# Patient Record
Sex: Female | Born: 1955 | Race: White | Hispanic: No | Marital: Single | Smoking: Current every day smoker
Health system: Southern US, Community
[De-identification: ages and names within clinical notes are randomized; demographics above are authoritative.]

## PROBLEM LIST (undated history)

## (undated) DIAGNOSIS — J449 Chronic obstructive pulmonary disease, unspecified: Secondary | ICD-10-CM

## (undated) DIAGNOSIS — Z860100 Personal history of colon polyps, unspecified: Secondary | ICD-10-CM

## (undated) DIAGNOSIS — E785 Hyperlipidemia, unspecified: Secondary | ICD-10-CM

## (undated) DIAGNOSIS — F1011 Alcohol abuse, in remission: Secondary | ICD-10-CM

## (undated) DIAGNOSIS — I10 Essential (primary) hypertension: Secondary | ICD-10-CM

## (undated) DIAGNOSIS — E063 Autoimmune thyroiditis: Secondary | ICD-10-CM

## (undated) DIAGNOSIS — C140 Malignant neoplasm of pharynx, unspecified: Secondary | ICD-10-CM

## (undated) DIAGNOSIS — F17200 Nicotine dependence, unspecified, uncomplicated: Secondary | ICD-10-CM

## (undated) DIAGNOSIS — F1411 Cocaine abuse, in remission: Secondary | ICD-10-CM

## (undated) DIAGNOSIS — R7303 Prediabetes: Secondary | ICD-10-CM

## (undated) HISTORY — DX: Personal history of colonic polyps: Z86.010

## (undated) HISTORY — DX: Personal history of colon polyps, unspecified: Z86.0100

## (undated) HISTORY — DX: Cocaine abuse, in remission: F14.11

## (undated) HISTORY — DX: Chronic obstructive pulmonary disease, unspecified: J44.9

## (undated) HISTORY — DX: Hyperlipidemia, unspecified: E78.5

## (undated) HISTORY — DX: Malignant neoplasm of pharynx, unspecified: C14.0

## (undated) HISTORY — DX: Essential (primary) hypertension: I10

## (undated) HISTORY — DX: Autoimmune thyroiditis: E06.3

## (undated) HISTORY — DX: Prediabetes: R73.03

## (undated) HISTORY — DX: Alcohol abuse, in remission: F10.11

## (undated) HISTORY — DX: Nicotine dependence, unspecified, uncomplicated: F17.200

---

## 2003-01-02 DIAGNOSIS — R0600 Dyspnea, unspecified: Secondary | ICD-10-CM | POA: Insufficient documentation

## 2003-01-02 DIAGNOSIS — Z8619 Personal history of other infectious and parasitic diseases: Secondary | ICD-10-CM | POA: Insufficient documentation

## 2008-05-07 DIAGNOSIS — G4730 Sleep apnea, unspecified: Secondary | ICD-10-CM | POA: Insufficient documentation

## 2009-05-28 DIAGNOSIS — E559 Vitamin D deficiency, unspecified: Secondary | ICD-10-CM | POA: Insufficient documentation

## 2009-06-10 DIAGNOSIS — H40009 Preglaucoma, unspecified, unspecified eye: Secondary | ICD-10-CM | POA: Insufficient documentation

## 2009-06-10 DIAGNOSIS — H04129 Dry eye syndrome of unspecified lacrimal gland: Secondary | ICD-10-CM | POA: Insufficient documentation

## 2012-10-19 DIAGNOSIS — E669 Obesity, unspecified: Secondary | ICD-10-CM | POA: Insufficient documentation

## 2014-01-16 DIAGNOSIS — F419 Anxiety disorder, unspecified: Secondary | ICD-10-CM | POA: Insufficient documentation

## 2014-10-12 HISTORY — PX: COLONOSCOPY W/ POLYPECTOMY: SHX1380

## 2015-03-07 DIAGNOSIS — S82451S Displaced comminuted fracture of shaft of right fibula, sequela: Secondary | ICD-10-CM | POA: Insufficient documentation

## 2015-03-21 DIAGNOSIS — R490 Dysphonia: Secondary | ICD-10-CM | POA: Insufficient documentation

## 2015-05-29 LAB — HM COLONOSCOPY

## 2016-01-09 DIAGNOSIS — C329 Malignant neoplasm of larynx, unspecified: Secondary | ICD-10-CM | POA: Insufficient documentation

## 2016-01-10 LAB — HM HEPATITIS C SCREENING LAB: HM Hepatitis Screen: NEGATIVE

## 2016-04-29 DIAGNOSIS — R252 Cramp and spasm: Secondary | ICD-10-CM | POA: Insufficient documentation

## 2016-05-06 DIAGNOSIS — E039 Hypothyroidism, unspecified: Secondary | ICD-10-CM | POA: Insufficient documentation

## 2016-05-06 DIAGNOSIS — E038 Other specified hypothyroidism: Secondary | ICD-10-CM | POA: Insufficient documentation

## 2016-10-12 HISTORY — PX: THROAT SURGERY: SHX803

## 2017-01-13 DIAGNOSIS — H9313 Tinnitus, bilateral: Secondary | ICD-10-CM | POA: Insufficient documentation

## 2017-02-03 DIAGNOSIS — M7021 Olecranon bursitis, right elbow: Secondary | ICD-10-CM | POA: Insufficient documentation

## 2017-04-07 LAB — HM MAMMOGRAPHY

## 2017-11-29 ENCOUNTER — Ambulatory Visit (INDEPENDENT_AMBULATORY_CARE_PROVIDER_SITE_OTHER): Payer: BLUE CROSS/BLUE SHIELD | Admitting: Physician Assistant

## 2017-11-29 ENCOUNTER — Encounter: Payer: Self-pay | Admitting: Physician Assistant

## 2017-11-29 VITALS — BP 142/83 | HR 78 | Temp 98.6°F | Resp 16 | Wt 182.0 lb

## 2017-11-29 DIAGNOSIS — R634 Abnormal weight loss: Secondary | ICD-10-CM

## 2017-11-29 DIAGNOSIS — I10 Essential (primary) hypertension: Secondary | ICD-10-CM | POA: Diagnosis not present

## 2017-11-29 DIAGNOSIS — Z2821 Immunization not carried out because of patient refusal: Secondary | ICD-10-CM | POA: Diagnosis not present

## 2017-11-29 DIAGNOSIS — Z860100 Personal history of colon polyps, unspecified: Secondary | ICD-10-CM

## 2017-11-29 DIAGNOSIS — E782 Mixed hyperlipidemia: Secondary | ICD-10-CM | POA: Diagnosis not present

## 2017-11-29 DIAGNOSIS — R7303 Prediabetes: Secondary | ICD-10-CM

## 2017-11-29 DIAGNOSIS — J449 Chronic obstructive pulmonary disease, unspecified: Secondary | ICD-10-CM

## 2017-11-29 DIAGNOSIS — C140 Malignant neoplasm of pharynx, unspecified: Secondary | ICD-10-CM | POA: Diagnosis not present

## 2017-11-29 DIAGNOSIS — Z23 Encounter for immunization: Secondary | ICD-10-CM

## 2017-11-29 DIAGNOSIS — E039 Hypothyroidism, unspecified: Secondary | ICD-10-CM

## 2017-11-29 DIAGNOSIS — E038 Other specified hypothyroidism: Secondary | ICD-10-CM

## 2017-11-29 DIAGNOSIS — F17210 Nicotine dependence, cigarettes, uncomplicated: Secondary | ICD-10-CM | POA: Diagnosis not present

## 2017-11-29 DIAGNOSIS — Z122 Encounter for screening for malignant neoplasm of respiratory organs: Secondary | ICD-10-CM | POA: Diagnosis not present

## 2017-11-29 DIAGNOSIS — Z7689 Persons encountering health services in other specified circumstances: Secondary | ICD-10-CM

## 2017-11-29 MED ORDER — UMECLIDINIUM BROMIDE 62.5 MCG/INH IN AEPB
1.0000 | INHALATION_SPRAY | Freq: Every day | RESPIRATORY_TRACT | 2 refills | Status: DC
Start: 2017-11-29 — End: 2022-08-13

## 2017-11-29 NOTE — Patient Instructions (Addendum)
-   Go downstairs for labs today. I need to know your lab results to safely refill your medications  For your blood pressure: - Goal <130/80 - continue your daily medications - baby aspirin 81 mg daily to help prevent heart attack/stroke - monitor and log blood pressures at home - check around the same time each day in a relaxed setting - Limit salt to <2000 mg/day - Follow DASH eating plan - limit alcohol to 2 standard drinks per day for men and 1 per day for women - avoid tobacco products - weight loss: 7% of current body weight - follow-up every 3 months for your blood pressure   For shortness of breath/COPD: - continue to work on quitting smoking. This is the best treatment - start Incruse inhaler, 1 puff daily

## 2017-11-29 NOTE — Progress Notes (Signed)
HPI:                                                                Zoe Snyder is a 62 y.o. female who presents to [facility] today to establish care  Current concerns: referral to ENT  History of throat cancer: reports hx of throat surgery in 2017 by Dr. Nydia Bouton at [facility]. She does not have these records with her today. States she saw her surgeon for follow-up about 3-4 months ago and was told she would need another surgery for a suspicious lesion seen on laryngoscopy. She endorses hoarseness and unintended weight loss. She is unable to afford follow-up/travel to [facility] and is requesting to establish with an ENT doctor here.   OSA: not compliant with CPAP   Subclinical Hypothyroidism: according to prior PCP, TSH has remained ~5.8 with normal T4 for the last 4 years. She does have +TPO antibodies. She never started Levothyroxine that was prescribed to her.  HTN: taking Amlodipine and HCTZ daily. Compliant with medications. Does not check BP's at home. Denies vision change, headache, chest pain with exertion, orthopnea, lightheadedness, syncope and edema. Risk factors include: tobacco use, age>55, HLD   No flowsheet data found.  No flowsheet data found.    Past Medical History:  Diagnosis Date  . History of alcohol abuse 11/30/2017  . History of cocaine abuse 11/30/2017  . History of colon polyps   . Hyperlipidemia   . Hypertension   . Prediabetes   . Throat cancer ([place])   . Tobacco use disorder    Past Surgical History:  Procedure Laterality Date  . COLONOSCOPY W/ POLYPECTOMY  2016  . THROAT SURGERY  2018   cancer   Social History   Tobacco Use  . Smoking status: Current Every Day Smoker    Packs/day: 0.50    Years: 45.00    Pack years: 22.50    Types: Cigarettes  . Smokeless tobacco: Never Used  . Tobacco comment: currently smoking 0.5 ppd, but she has cut down from over 1 ppd  Substance Use  Topics  . Alcohol use: No    Frequency: Never   family history includes Colon cancer in her maternal aunt; Diabetes in her maternal grandmother; Hypertension in her mother.    ROS: Review of Systems  Constitutional: Positive for weight loss.  HENT: Positive for sore throat.   Gastrointestinal: Positive for nausea.  Musculoskeletal: Positive for myalgias.     Medications: Current Outpatient Medications  Medication Sig Dispense Refill  . aspirin EC 81 MG tablet Take 1 tablet by mouth daily.    Marland Kitchen atorvastatin (LIPITOR) 20 MG tablet Take 1 tablet by mouth daily.    . Cholecalciferol (VITAMIN D-1000 MAX ST) 1000 units tablet Take 1 tablet by mouth daily.    . fluticasone (FLONASE) 50 MCG/ACT nasal spray Place into the nose.    . loratadine (CLARITIN) 10 MG tablet Take by mouth.    . Multiple Vitamin (MULTI-VITAMIN) tablet Take by mouth.    Marland Kitchen omeprazole (PRILOSEC) 20 MG capsule Take by mouth.    . varenicline (CHANTIX PAK) 0.5 MG X 11 & 1 MG X 42 tablet Take one 0.5mg  tab once daily for first 3 days, then  0.5mg  tab twice daily for 4 days, then 1mg  tab twice daily.    Marland Kitchen amLODipine (NORVASC) 10 MG tablet Take 1 tablet (10 mg total) by mouth daily. 90 tablet 0  . hydrochlorothiazide (HYDRODIURIL) 25 MG tablet Take 1 tablet (25 mg total) by mouth daily. 90 tablet 0  . umeclidinium bromide (INCRUSE ELLIPTA) 62.5 MCG/INH AEPB Inhale 1 puff into the lungs daily. 30 each 2   No current facility-administered medications for this visit.    Allergies  Allergen Reactions  . Epinephrine Anaphylaxis    Respiratory problems, e.g., wheezing; Palpitations  Respiratory problems, e.g., wheezing; Palpitations        Objective:  BP (!) 142/83   Pulse 78   Temp 98.6 F (37 C) (Oral)   Resp 16   Wt 182 lb (82.6 kg)   SpO2 98%  Gen:  alert, not ill-appearing, no distress, appropriate for age HEENT: head normocephalic without obvious abnormality, conjunctiva and cornea clear, oropharynx clear,  moist mucous membranes, neck supple, no adenopathy, trachea midline Pulm: Normal work of breathing, voice is hoarse, clear to auscultation bilaterally, no wheezes, rales or rhonchi CV: Normal rate, regular rhythm, s1 and s2 distinct, no murmurs, clicks or rubs  Neuro: alert and oriented x 3, no tremor MSK: extremities atraumatic, normal gait and station Skin: intact, no rashes on exposed skin, no jaundice, no cyanosis Psych: well-groomed, cooperative, good eye contact, euthymic mood, affect mood-congruent, speech is articulate, and thought processes clear and goal-directed    No results found for this or any previous visit (from the past 72 hour(s)). No results found.    Assessment and Plan: 62 y.o. female with   1. Encounter to establish care - reviewed PMH, PSH, PFH, medications and allergies - reviewed health maintenance - colonoscopy UTD per patient, requesting records from [facility] - mammogram UTD, to be abstracted  2. Throat cancer ([place]) - do not have access to records. Requested today - CBC with Differential/Platelet - Ambulatory referral to ENT  3. History of colon polyps - requesting records from [facility]  4. Prediabetes - Hemoglobin A1c  5. Hypertension goal BP (blood pressure) < 130/80 BP Readings from Last 3 Encounters:  11/29/17 (!) 142/83  - counseled on therapeutic lifestyle changes - continue Amlodipine and HCTZ - COMPLETE METABOLIC PANEL WITH GFR  6. Mixed hyperlipidemia - Lipid Panel w/reflex Direct LDL  7. Subclinical hypothyroidism - TSH + free T4  8. Unintended weight loss - CBC with Differential/Platelet  9. Need for 23-polyvalent pneumococcal polysaccharide vaccine - Pneumococcal polysaccharide vaccine 23-valent greater than or equal to 2yo subcutaneous/IM  10. Encounter for screening for lung cancer - CT CHEST LUNG CA SCREEN LOW DOSE W/O CM; Future  11. Heavy tobacco smoker >10 cigarettes per day - CT CHEST LUNG CA SCREEN LOW DOSE W/O CM;  Future  12. Refused influenza vaccine  13. Chronic obstructive pulmonary disease, unspecified COPD type ([place]) - Pneumovax given today - briefly counseled on smoking cessation as the mainstay of treatment - starting Ellipta - umeclidinium bromide (INCRUSE ELLIPTA) 62.5 MCG/INH AEPB; Inhale 1 puff into the lungs daily.  Dispense: 30 each; Refill: 2   Patient education and anticipatory guidance given Patient agrees with treatment plan Follow-up in 8 weeks or sooner as needed if symptoms worsen or fail to improve  Darlyne Russian PA-C

## 2017-11-30 ENCOUNTER — Encounter: Payer: Self-pay | Admitting: Physician Assistant

## 2017-11-30 ENCOUNTER — Other Ambulatory Visit: Payer: Self-pay

## 2017-11-30 ENCOUNTER — Telehealth: Payer: Self-pay | Admitting: Physician Assistant

## 2017-11-30 ENCOUNTER — Encounter (INDEPENDENT_AMBULATORY_CARE_PROVIDER_SITE_OTHER): Payer: Self-pay

## 2017-11-30 DIAGNOSIS — F1411 Cocaine abuse, in remission: Secondary | ICD-10-CM

## 2017-11-30 DIAGNOSIS — F1011 Alcohol abuse, in remission: Secondary | ICD-10-CM | POA: Insufficient documentation

## 2017-11-30 HISTORY — DX: Alcohol abuse, in remission: F10.11

## 2017-11-30 HISTORY — DX: Cocaine abuse, in remission: F14.11

## 2017-11-30 LAB — HEMOGLOBIN A1C
Hgb A1c MFr Bld: 6 % of total Hgb — ABNORMAL HIGH (ref <5.7)
Mean Plasma Glucose: 126 (calc)
eAG (mmol/L): 7 (calc)

## 2017-11-30 LAB — COMPLETE METABOLIC PANEL WITH GFR
AG Ratio: 1.7 (calc) (ref 1.0–2.5)
ALBUMIN MSPROF: 4.3 g/dL (ref 3.6–5.1)
ALKALINE PHOSPHATASE (APISO): 83 U/L (ref 33–130)
ALT: 12 U/L (ref 6–29)
AST: 14 U/L (ref 10–35)
BILIRUBIN TOTAL: 0.7 mg/dL (ref 0.2–1.2)
BUN: 17 mg/dL (ref 7–25)
CHLORIDE: 103 mmol/L (ref 98–110)
CO2: 26 mmol/L (ref 20–32)
Calcium: 9.5 mg/dL (ref 8.6–10.4)
Creat: 0.95 mg/dL (ref 0.50–0.99)
GFR, Est African American: 75 mL/min/{1.73_m2} (ref >=60)
GFR, Est Non African American: 65 mL/min/{1.73_m2} (ref >=60)
GLUCOSE: 100 mg/dL — AB (ref 65–99)
Globulin: 2.6 g/dL (calc) (ref 1.9–3.7)
POTASSIUM: 3.5 mmol/L (ref 3.5–5.3)
Sodium: 138 mmol/L (ref 135–146)
Total Protein: 6.9 g/dL (ref 6.1–8.1)

## 2017-11-30 LAB — CBC WITH DIFFERENTIAL/PLATELET
Basophils Absolute: 59 cells/uL (ref 0–200)
Basophils Relative: 0.7 %
EOS ABS: 84 {cells}/uL (ref 15–500)
Eosinophils Relative: 1 %
HCT: 37.5 % (ref 35.0–45.0)
Hemoglobin: 13.4 g/dL (ref 11.7–15.5)
Lymphs Abs: 2234 cells/uL (ref 850–3900)
MCH: 32.1 pg (ref 27.0–33.0)
MCHC: 35.7 g/dL (ref 32.0–36.0)
MCV: 89.9 fL (ref 80.0–100.0)
MONOS PCT: 8.6 %
MPV: 10.5 fL (ref 7.5–12.5)
NEUTROS PCT: 63.1 %
Neutro Abs: 5300 cells/uL (ref 1500–7800)
PLATELETS: 260 10*3/uL (ref 140–400)
RBC: 4.17 10*6/uL (ref 3.80–5.10)
RDW: 12.9 % (ref 11.0–15.0)
TOTAL LYMPHOCYTE: 26.6 %
WBC: 8.4 10*3/uL (ref 3.8–10.8)
WBCMIX: 722 {cells}/uL (ref 200–950)

## 2017-11-30 LAB — LIPID PANEL W/REFLEX DIRECT LDL
CHOL/HDL RATIO: 2.9 (calc) (ref <5.0)
CHOLESTEROL: 195 mg/dL (ref <200)
HDL: 68 mg/dL (ref >50)
LDL Cholesterol (Calc): 101 mg/dL (calc) — ABNORMAL HIGH
NON-HDL CHOLESTEROL (CALC): 127 mg/dL (ref <130)
Triglycerides: 164 mg/dL — ABNORMAL HIGH (ref <150)

## 2017-11-30 LAB — T4, FREE: FREE T4: 0.9 ng/dL (ref 0.8–1.8)

## 2017-11-30 LAB — TSH+FREE T4: TSH W/REFLEX TO FT4: 7.11 m[IU]/L — AB (ref 0.40–4.50)

## 2017-11-30 MED ORDER — HYDROCHLOROTHIAZIDE 25 MG PO TABS: 25.0000 mg | ORAL_TABLET | Freq: Every day | ORAL | 0 refills | Status: DC

## 2017-11-30 MED ORDER — AMLODIPINE BESYLATE 10 MG PO TABS: 10.0000 mg | ORAL_TABLET | Freq: Every day | ORAL | 0 refills | Status: DC

## 2017-11-30 NOTE — Progress Notes (Signed)
-   blood counts look great - prediabetes is stable - TSH is high with a normal T4 - this is still consistent with subclinical low thyroid, which means her thyroid is still making enough hormone, but the gland is wearing out. Thyroid replacement is optional and may improve symptoms. - Cholesterol is where we want it to prevent heart attack. I would continue her Atorvastatin. She can take it at bedtime if she is having some muscle aches

## 2017-11-30 NOTE — Telephone Encounter (Signed)
Pt stopped in today. She is in need of refills on these meds.  Norvasc 10mg  1tablet x1 Aspirin EC 81mg  1 tabletx1  Cholecalciferol 1000 units x1 Hydrodiuril 25mg  x1 Multi-vitamin Please send to pharmacy on file.

## 2017-11-30 NOTE — Telephone Encounter (Signed)
Refills of amlodipine and hydrochlorothiazide sent to pharmacy.  Other meds can purchase OTC.  Pt advise. -EH/RMA

## 2017-12-01 ENCOUNTER — Encounter: Payer: Self-pay | Admitting: Physician Assistant

## 2017-12-08 ENCOUNTER — Encounter: Payer: Self-pay | Admitting: Physician Assistant

## 2017-12-08 ENCOUNTER — Telehealth: Payer: Self-pay | Admitting: Physician Assistant

## 2017-12-08 DIAGNOSIS — R7303 Prediabetes: Secondary | ICD-10-CM | POA: Insufficient documentation

## 2017-12-08 DIAGNOSIS — R634 Abnormal weight loss: Secondary | ICD-10-CM | POA: Insufficient documentation

## 2017-12-08 DIAGNOSIS — E782 Mixed hyperlipidemia: Secondary | ICD-10-CM | POA: Insufficient documentation

## 2017-12-08 DIAGNOSIS — J441 Chronic obstructive pulmonary disease with (acute) exacerbation: Secondary | ICD-10-CM | POA: Insufficient documentation

## 2017-12-08 DIAGNOSIS — J449 Chronic obstructive pulmonary disease, unspecified: Secondary | ICD-10-CM | POA: Insufficient documentation

## 2017-12-08 DIAGNOSIS — C140 Malignant neoplasm of pharynx, unspecified: Secondary | ICD-10-CM | POA: Insufficient documentation

## 2017-12-08 DIAGNOSIS — F17210 Nicotine dependence, cigarettes, uncomplicated: Secondary | ICD-10-CM | POA: Insufficient documentation

## 2017-12-08 DIAGNOSIS — I10 Essential (primary) hypertension: Secondary | ICD-10-CM | POA: Insufficient documentation

## 2017-12-08 NOTE — Telephone Encounter (Signed)
Patient is requesting to switch her primary care from [facility] to Dr. Sheppard Coil. She stated that Evlyn Clines is great, but one of her other doctor's suggested that she switch to Dr. Sheppard Coil. Please advise. Thanks!

## 2017-12-08 NOTE — Telephone Encounter (Signed)
Fine with me, can have her schedule a establish care visit!

## 2017-12-08 NOTE — Telephone Encounter (Signed)
That's fine with me if okay with Dr. Sheppard Coil

## 2017-12-14 ENCOUNTER — Encounter: Payer: Self-pay | Admitting: Physician Assistant

## 2017-12-22 ENCOUNTER — Encounter: Payer: Self-pay | Admitting: Physician Assistant

## 2018-01-27 ENCOUNTER — Ambulatory Visit: Payer: BLUE CROSS/BLUE SHIELD | Admitting: Physician Assistant

## 2018-01-27 DIAGNOSIS — Z0189 Encounter for other specified special examinations: Secondary | ICD-10-CM

## 2018-02-27 ENCOUNTER — Other Ambulatory Visit: Payer: Self-pay | Admitting: Physician Assistant

## 2020-01-15 ENCOUNTER — Ambulatory Visit: Payer: BLUE CROSS/BLUE SHIELD

## 2020-09-11 ENCOUNTER — Encounter (HOSPITAL_COMMUNITY): Payer: Self-pay | Admitting: Emergency Medicine

## 2020-09-11 ENCOUNTER — Other Ambulatory Visit: Payer: Self-pay

## 2020-09-11 ENCOUNTER — Ambulatory Visit (HOSPITAL_COMMUNITY)
Admission: EM | Admit: 2020-09-11 | Discharge: 2020-09-11 | Disposition: A | Discharge status: Home / Self Care | Payer: Self-pay | Attending: Family Medicine | Admitting: Family Medicine

## 2020-09-11 DIAGNOSIS — I10 Essential (primary) hypertension: Secondary | ICD-10-CM | POA: Insufficient documentation

## 2020-09-11 DIAGNOSIS — Z20822 Contact with and (suspected) exposure to covid-19: Secondary | ICD-10-CM | POA: Insufficient documentation

## 2020-09-11 DIAGNOSIS — Z760 Encounter for issue of repeat prescription: Secondary | ICD-10-CM | POA: Insufficient documentation

## 2020-09-11 DIAGNOSIS — J069 Acute upper respiratory infection, unspecified: Secondary | ICD-10-CM | POA: Insufficient documentation

## 2020-09-11 LAB — BASIC METABOLIC PANEL
Anion gap: 10 (ref 5–15)
BUN: 11 mg/dL (ref 8–23)
CO2: 27 mmol/L (ref 22–32)
Calcium: 9.4 mg/dL (ref 8.9–10.3)
Chloride: 104 mmol/L (ref 98–111)
Creatinine, Ser: 0.72 mg/dL (ref 0.44–1.00)
GFR, Estimated: >60 mL/min (ref >60)
Glucose, Bld: 93 mg/dL (ref 70–99)
Potassium: 4.3 mmol/L (ref 3.5–5.1)
Sodium: 141 mmol/L (ref 135–145)

## 2020-09-11 MED ORDER — IPRATROPIUM BROMIDE 0.06 % NA SOLN: 2.0000 | Freq: Four times a day (QID) | NASAL | 0 refills | Status: DC | PRN

## 2020-09-11 MED ORDER — HYDROCHLOROTHIAZIDE 25 MG PO TABS: 25.0000 mg | ORAL_TABLET | Freq: Every day | ORAL | 0 refills | Status: DC

## 2020-09-11 MED ORDER — AMLODIPINE BESYLATE 10 MG PO TABS: 10.0000 mg | ORAL_TABLET | Freq: Every day | ORAL | 0 refills | Status: DC

## 2020-09-11 NOTE — Discharge Instructions (Signed)
Please restart your blood pressure medications.  I have provided 30 day supply in order for you to establish with a primary care provider, as we do not manage these medications long term from urgent care.  Push fluids to ensure adequate hydration and keep secretions thin.  Tylenol as needed for fevers.  Nasal spray as needed for nasal congestion.  Continue to decrease to quit smoking as able.  Self isolate until covid results are back and negative.  Will notify you by phone of any positive findings. Your negative results will be sent through your MyChart.     Return for any worsening or persistent symptoms.

## 2020-09-11 NOTE — ED Provider Notes (Signed)
[place]    CSN: [id number] Arrival date & time: 09/11/20  1511      History   Chief Complaint Chief Complaint  Patient presents with  . Headache  . Sore Throat  . Diarrhea  . Nasal Congestion    HPI Zoe Snyder is a 64 y.o. female.   Zoe Snyder presents with complaints of nasal congestion, sore throat, some facial pressure, and diarrhea which started two days ago. No fevers. No shortness of breath . History of copd. She still does smoke. No known ill contacts. Works at [employer] around others. No nausea or vomiting. She hasn't taken any medications in the past year, including her blood pressure medication. Moved here two years ago and hasn't established with a PCP she has followed with. History of larynx cancer which was treated. Doesn't have a local ENT or oncologist. She has been vaccinated for covid-19.    ROS per HPI, negative if not otherwise mentioned.      Past Medical History:  Diagnosis Date  . COPD (chronic obstructive pulmonary disease) ([place])   . Hashimoto's thyroiditis   . History of alcohol abuse 11/30/2017  . History of cocaine abuse ([place]) 11/30/2017  . History of colon polyps   . Hyperlipidemia   . Hypertension   . Prediabetes   . Throat cancer ([place])   . Tobacco use disorder     Patient Active Problem List   Diagnosis Date Noted  . Chronic obstructive pulmonary disease ([place]) 12/08/2017  . Heavy tobacco smoker >10 cigarettes per day 12/08/2017  . Unintended weight loss 12/08/2017  . Mixed hyperlipidemia 12/08/2017  . Hypertension goal BP (blood pressure) < 130/80 12/08/2017  . Prediabetes 12/08/2017  . Throat cancer ([place]) 12/08/2017  . History of cocaine abuse ([place]) 11/30/2017  . History of alcohol abuse 11/30/2017  . Refused influenza vaccine 11/29/2017  . History of colon polyps   . Olecranon bursitis of right elbow 02/03/2017  . 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 Subclinical hypothyroidism 05/06/2016  . Muscle  cramps 04/29/2016  . Malignant neoplasm of larynx ([place]) 01/09/2016  . Dysphonia 03/21/2015  . Displaced comminuted fracture of shaft of right fibula, sequela 03/07/2015  . Anxiety disorder 01/16/2014  . Obesity 10/19/2012  . Dry eye syndrome 06/10/2009  . 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 Vitamin D deficiency 05/28/2009  . Sleep apnea 05/07/2008  . History of hepatitis C 01/02/2003  . Dyspnea 01/02/2003    Past Surgical History:  Procedure Laterality Date  . COLONOSCOPY W/ POLYPECTOMY  2016  . THROAT SURGERY  2018   cancer    OB History   No obstetric history on file.      Home Medications    Prior to Admission medications   Medication Sig Start Date End Date Taking? Authorizing Provider  amLODipine (NORVASC) 10 MG tablet Take 1 tablet (10 mg total) by mouth daily. Due for follow up visit 09/11/20   Augusto Gamble B, NP  aspirin EC 81 MG tablet Take 1 tablet by mouth daily. 01/07/17   [provider]  atorvastatin (LIPITOR) 20 MG tablet Take 1 tablet by mouth daily. 01/07/17   [provider]  Cholecalciferol (VITAMIN D-1000 MAX ST) 1000 units tablet Take 1 tablet by mouth daily. 01/07/17   [provider]  fluticasone (FLONASE) 50 MCG/ACT nasal spray Place into the nose. 01/08/17   [provider]  hydrochlorothiazide (HYDRODIURIL) 25 MG tablet Take 1 tablet (25 mg total) by mouth daily. Due for follow  up visit 09/11/20   Augusto Gamble B, NP  ipratropium (ATROVENT) 0.06 % nasal spray Place 2 sprays into both nostrils 4 (four) times daily as needed for rhinitis. 09/11/20   Zigmund Gottron, NP  loratadine (CLARITIN) 10 MG tablet Take by mouth. 01/08/17   [provider]  Multiple Vitamin (MULTI-VITAMIN) tablet Take by mouth. 05/08/16   [provider]  omeprazole (PRILOSEC) 20 MG capsule Take by mouth. 01/07/17   [provider]  umeclidinium bromide (INCRUSE ELLIPTA) 62.5 MCG/INH AEPB Inhale 1 puff into the lungs daily. 11/29/17    Trixie Dredge, PA-C  varenicline (CHANTIX PAK) 0.5 MG X 11 & 1 MG X 42 tablet Take one 0.5mg  tab once daily for first 3 days, then 0.5mg  tab twice daily for 4 days, then 1mg  tab twice daily. 01/07/17   [provider]    Family History Family History  Problem Relation Age of Onset  . Hypertension Mother   . Colon cancer Maternal Aunt   . Diabetes Maternal Grandmother     Social History Social History   Tobacco Use  . Smoking status: Current Every Day Smoker    Packs/day: 0.50    Years: 45.00    Pack years: 22.50    Types: Cigarettes  . Smokeless tobacco: Never Used  . Tobacco comment: currently smoking 0.5 ppd, but she has cut down from over 1 ppd  Vaping Use  . Vaping Use: Never used  Substance Use Topics  . Alcohol use: No  . Drug use: No     Allergies   Epinephrine   Review of Systems Review of Systems   Physical Exam Triage Vital Signs ED Triage Vitals  Enc Vitals Group     BP 09/11/20 1632 (!) 204/128     Pulse Rate 09/11/20 1632 73     Resp 09/11/20 1632 18     Temp 09/11/20 1632 98.6 F (37 C)     Temp Source 09/11/20 1632 Oral     SpO2 09/11/20 1632 98 %     Weight --      Height --      Head Circumference --      Peak Flow --      Pain Score 09/11/20 1628 0     Pain Loc --      Pain Edu? --      Excl. in [place]? --    No data found.  Updated Vital Signs BP (!) 204/128 (BP Location: Right Arm)   Pulse 73   Temp 98.6 F (37 C) (Oral)   Resp 18   SpO2 98%   Visual Acuity Right Eye Distance:   Left Eye Distance:   Bilateral Distance:    Right Eye Near:   Left Eye Near:    Bilateral Near:     Physical Exam Constitutional:      General: She is not in acute distress.    Appearance: She is well-developed.  Cardiovascular:     Rate and Rhythm: Normal rate.  Pulmonary:     Effort: Pulmonary effort is normal.     Comments: Congested cough noted; mild laryngitis noted with hoarse voice  Musculoskeletal:     Right  lower leg: No edema.     Left lower leg: No edema.  Skin:    General: Skin is warm and dry.  Neurological:     Mental Status: She is alert and oriented to person, place, and time.      UC Treatments /  Results  Labs (all labs ordered are listed, but only abnormal results are displayed) Labs Reviewed  SARS CORONAVIRUS 2 (TAT 6-24 HRS)  BASIC METABOLIC PANEL    EKG   Radiology No results found.  Procedures Procedures (including critical care time)  Medications Ordered in UC Medications - No data to display  Initial Impression / Assessment and Plan / UC Course  I have reviewed the triage vital signs and the nursing notes.  Pertinent labs & imaging results that were available during my care of the patient were reviewed by me and considered in my medical decision making (see chart for details).     Non toxic. Benign physical exam.  History and physical consistent with viral illness.  Supportive cares recommended. Blood pressure elevated today today, refilled medications with bmp collected for baseline. Emphasized follow up with a pcp for recheck and management. Encouraged discontinue to quit smoking. Return precautions provided. Patient verbalized understanding and agreeable to plan.   Final Clinical Impressions(s) / UC Diagnoses   Final diagnoses:  Upper respiratory tract infection, unspecified type  Medication refill  Hypertension, unspecified type     Discharge Instructions     Please restart your blood pressure medications.  I have provided 30 day supply in order for you to establish with a primary care provider, as we do not manage these medications long term from urgent care.  Push fluids to ensure adequate hydration and keep secretions thin.  Tylenol as needed for fevers.  Nasal spray as needed for nasal congestion.  Continue to decrease to quit smoking as able.  Self isolate until covid results are back and negative.  Will notify you by phone of any positive  findings. Your negative results will be sent through your MyChart.     Return for any worsening or persistent symptoms.      ED Prescriptions    Medication Sig Dispense Auth. Provider   amLODipine (NORVASC) 10 MG tablet Take 1 tablet (10 mg total) by mouth daily. Due for follow up visit 30 tablet Augusto Gamble B, NP   hydrochlorothiazide (HYDRODIURIL) 25 MG tablet Take 1 tablet (25 mg total) by mouth daily. Due for follow up visit 30 tablet Augusto Gamble B, NP   ipratropium (ATROVENT) 0.06 % nasal spray Place 2 sprays into both nostrils 4 (four) times daily as needed for rhinitis. 15 mL Zigmund Gottron, NP     PDMP not reviewed this encounter.   Zigmund Gottron, NP 09/13/20 [phone]

## 2020-09-11 NOTE — ED Triage Notes (Addendum)
Pt presents with sore throat, headache, diarrhea, cough, and nasal congestion xs 3-4 days. States has hx of throat cancer but has not seen doctor or taken any medication in over a year.   Elizbeth Squires, RN notified on pts BP.

## 2020-09-12 LAB — SARS CORONAVIRUS 2 (TAT 6-24 HRS): SARS Coronavirus 2: NEGATIVE

## 2020-09-30 ENCOUNTER — Emergency Department (HOSPITAL_COMMUNITY)
Admission: EM | Admit: 2020-09-30 | Discharge: 2020-10-01 | Disposition: A | Discharge status: Home / Self Care | Payer: Self-pay | Attending: Emergency Medicine | Admitting: Emergency Medicine

## 2020-09-30 ENCOUNTER — Other Ambulatory Visit: Payer: Self-pay

## 2020-09-30 ENCOUNTER — Emergency Department (HOSPITAL_COMMUNITY): Payer: Self-pay

## 2020-09-30 DIAGNOSIS — F17210 Nicotine dependence, cigarettes, uncomplicated: Secondary | ICD-10-CM | POA: Insufficient documentation

## 2020-09-30 DIAGNOSIS — I10 Essential (primary) hypertension: Secondary | ICD-10-CM | POA: Insufficient documentation

## 2020-09-30 DIAGNOSIS — J069 Acute upper respiratory infection, unspecified: Secondary | ICD-10-CM | POA: Insufficient documentation

## 2020-09-30 DIAGNOSIS — J441 Chronic obstructive pulmonary disease with (acute) exacerbation: Secondary | ICD-10-CM | POA: Insufficient documentation

## 2020-09-30 DIAGNOSIS — Z7982 Long term (current) use of aspirin: Secondary | ICD-10-CM | POA: Insufficient documentation

## 2020-09-30 DIAGNOSIS — Z79899 Other long term (current) drug therapy: Secondary | ICD-10-CM | POA: Insufficient documentation

## 2020-09-30 DIAGNOSIS — Z20822 Contact with and (suspected) exposure to covid-19: Secondary | ICD-10-CM | POA: Insufficient documentation

## 2020-09-30 LAB — CBC WITH DIFFERENTIAL/PLATELET
Abs Immature Granulocytes: 0.08 10*3/uL — ABNORMAL HIGH (ref 0.00–0.07)
Basophils Absolute: 0.1 10*3/uL (ref 0.0–0.1)
Basophils Relative: 0 %
Eosinophils Absolute: 0 10*3/uL (ref 0.0–0.5)
Eosinophils Relative: 0 %
HCT: 40.2 % (ref 36.0–46.0)
Hemoglobin: 13.9 g/dL (ref 12.0–15.0)
Immature Granulocytes: 1 %
Lymphocytes Relative: 8 %
Lymphs Abs: 1.1 10*3/uL (ref 0.7–4.0)
MCH: 31.6 pg (ref 26.0–34.0)
MCHC: 34.6 g/dL (ref 30.0–36.0)
MCV: 91.4 fL (ref 80.0–100.0)
Monocytes Absolute: 0.9 10*3/uL (ref 0.1–1.0)
Monocytes Relative: 7 %
Neutro Abs: 11.8 10*3/uL — ABNORMAL HIGH (ref 1.7–7.7)
Neutrophils Relative %: 84 %
Platelets: 250 10*3/uL (ref 150–400)
RBC: 4.4 MIL/uL (ref 3.87–5.11)
RDW: 12.7 % (ref 11.5–15.5)
WBC: 14 10*3/uL — ABNORMAL HIGH (ref 4.0–10.5)
nRBC: 0 % (ref 0.0–0.2)

## 2020-09-30 LAB — COMPREHENSIVE METABOLIC PANEL
ALT: 14 U/L (ref 0–44)
AST: 14 U/L — ABNORMAL LOW (ref 15–41)
Albumin: 3.4 g/dL — ABNORMAL LOW (ref 3.5–5.0)
Alkaline Phosphatase: 69 U/L (ref 38–126)
Anion gap: 12 (ref 5–15)
BUN: 16 mg/dL (ref 8–23)
CO2: 29 mmol/L (ref 22–32)
Calcium: 9.1 mg/dL (ref 8.9–10.3)
Chloride: 94 mmol/L — ABNORMAL LOW (ref 98–111)
Creatinine, Ser: 0.89 mg/dL (ref 0.44–1.00)
GFR, Estimated: >60 mL/min (ref >60)
Glucose, Bld: 113 mg/dL — ABNORMAL HIGH (ref 70–99)
Potassium: 3.2 mmol/L — ABNORMAL LOW (ref 3.5–5.1)
Sodium: 135 mmol/L (ref 135–145)
Total Bilirubin: 1.1 mg/dL (ref 0.3–1.2)
Total Protein: 7 g/dL (ref 6.5–8.1)

## 2020-09-30 LAB — RESP PANEL BY RT-PCR (RSV, FLU A&B, COVID)  RVPGX2
Influenza A by PCR: NEGATIVE
Influenza B by PCR: NEGATIVE
Resp Syncytial Virus by PCR: NEGATIVE
SARS Coronavirus 2 by RT PCR: NEGATIVE

## 2020-09-30 MED ORDER — ACETAMINOPHEN 325 MG PO TABS
650.0000 mg | ORAL_TABLET | Freq: Once | ORAL | Status: AC | PRN
  Administered 2020-09-30: 650 mg via ORAL
  Filled 2020-09-30: qty 2

## 2020-09-30 NOTE — ED Triage Notes (Signed)
C/O fever, cough and congestion and stated tested negative for covid,

## 2020-10-01 MED ORDER — ALBUTEROL SULFATE HFA 108 (90 BASE) MCG/ACT IN AERS: 2.0000 | INHALATION_SPRAY | RESPIRATORY_TRACT | Status: DC | PRN

## 2020-10-01 MED ORDER — PREDNISONE 20 MG PO TABS: 40.0000 mg | ORAL_TABLET | Freq: Every day | ORAL | 0 refills | Status: DC

## 2020-10-01 MED ORDER — ACETAMINOPHEN 500 MG PO TABS
1000.0000 mg | ORAL_TABLET | Freq: Once | ORAL | Status: AC
  Administered 2020-10-01: 1000 mg via ORAL
  Filled 2020-10-01: qty 2

## 2020-10-01 MED ORDER — AMOXICILLIN 500 MG PO CAPS: 1000.0000 mg | ORAL_CAPSULE | Freq: Two times a day (BID) | ORAL | 0 refills | Status: DC

## 2020-10-01 NOTE — ED Provider Notes (Signed)
[facility] EMERGENCY DEPARTMENT Provider Note   CSN: [id number] Arrival date & time: 09/30/20  1710     History Chief Complaint  Patient presents with   Fever   Cough    Zoe Snyder is a 64 y.o. female.  Patient presents to the emergency department for evaluation of fever, sore throat, congestion, sinus pressure with cough for several days.  Patient does have a history of COPD.  She does not currently use an inhaler any treatment.  She had outpatient Covid testing that was negative.        Past Medical History:  Diagnosis Date   COPD (chronic obstructive pulmonary disease) ([place])    Hashimoto's thyroiditis    History of alcohol abuse 11/30/2017   History of cocaine abuse ([place]) 11/30/2017   History of colon polyps    Hyperlipidemia    Hypertension    Prediabetes    Throat cancer ([place])    Tobacco use disorder     Patient Active Problem List   Diagnosis Date Noted   Chronic obstructive pulmonary disease ([place]) 12/08/2017   Heavy tobacco smoker >10 cigarettes per day 12/08/2017   Unintended weight loss 12/08/2017   Mixed hyperlipidemia 12/08/2017   Hypertension goal BP (blood pressure) < 130/80 12/08/2017   Prediabetes 12/08/2017   Throat cancer ([place]) 12/08/2017   History of cocaine abuse ([place]) 11/30/2017   History of alcohol abuse 11/30/2017   Refused influenza vaccine 11/29/2017   History of colon polyps    Olecranon bursitis of right elbow 02/03/2017   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   Subclinical hypothyroidism 05/06/2016   Muscle cramps 04/29/2016   Malignant neoplasm of larynx ([place]) 01/09/2016   Dysphonia 03/21/2015   Displaced comminuted fracture of shaft of right fibula, sequela 03/07/2015   Anxiety disorder 01/16/2014   Obesity 10/19/2012   Dry eye syndrome 06/10/2009   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     Family History  Problem Relation Age of Onset   Hypertension Mother    Colon cancer Maternal Aunt    Diabetes Maternal Grandmother     Social History   Tobacco Use   Smoking status: Current Every Day Smoker    Packs/day: 0.50    Years: 45.00    Pack years: 22.50    Types: Cigarettes   Smokeless tobacco: Never Used   Tobacco comment: currently smoking 0.5 ppd, but she has cut down from over 1 ppd  Vaping Use   Vaping Use: Never used  Substance Use Topics   Alcohol use: No   Drug use: No    Home Medications Prior to Admission medications   Medication Sig Start Date End Date Taking? Authorizing Provider  amLODipine (NORVASC) 10 MG tablet Take 1 tablet (10 mg total) by mouth daily. Due for follow up visit 09/11/20   Augusto Gamble B, NP  amoxicillin (AMOXIL) 500 MG capsule Take 2 capsules (1,000 mg total) by mouth 2 (two) times daily. 10/01/20   Orpah Greek, MD  aspirin EC 81 MG tablet Take 1 tablet by mouth daily. 01/07/17   [provider]  atorvastatin (LIPITOR) 20 MG tablet Take 1 tablet  by mouth daily. 01/07/17   [provider]  Cholecalciferol (VITAMIN D-1000 MAX ST) 1000 units tablet Take 1 tablet by mouth daily. 01/07/17   [provider]  fluticasone (FLONASE) 50 MCG/ACT nasal spray Place into the nose. 01/08/17   [provider]  hydrochlorothiazide (HYDRODIURIL) 25 MG tablet Take 1 tablet (25 mg total) by mouth daily. Due for follow up visit 09/11/20   Augusto Gamble B, NP  ipratropium (ATROVENT) 0.06 % nasal spray Place 2 sprays into both nostrils 4 (four) times daily as needed for rhinitis. 09/11/20   Zigmund Gottron, NP  loratadine (CLARITIN) 10 MG tablet Take by mouth. 01/08/17   [provider]  Multiple  Vitamin (MULTI-VITAMIN) tablet Take by mouth. 05/08/16   [provider]  omeprazole (PRILOSEC) 20 MG capsule Take by mouth. 01/07/17   [provider]  predniSONE (DELTASONE) 20 MG tablet Take 2 tablets (40 mg total) by mouth daily with breakfast. 10/01/20   Shalona Harbour, Gwenyth Allegra, MD  umeclidinium bromide (INCRUSE ELLIPTA) 62.5 MCG/INH AEPB Inhale 1 puff into the lungs daily. 11/29/17   Trixie Dredge, PA-C  varenicline (CHANTIX PAK) 0.5 MG X 11 & 1 MG X 42 tablet Take one 0.5mg  tab once daily for first 3 days, then 0.5mg  tab twice daily for 4 days, then 1mg  tab twice daily. 01/07/17   [provider]    Allergies    Epinephrine  Review of Systems   Review of Systems  Constitutional: Positive for chills and fever.  HENT: Positive for congestion and sore throat.   Respiratory: Positive for cough.   All other systems reviewed and are negative.   Physical Exam Updated Vital Signs BP 138/71    Pulse 96    Temp (!) 101.6 F (38.7 C) (Oral)    Resp 20    Ht 5' 3.5" (1.613 m)    Wt 81.6 kg    SpO2 96%    BMI 31.39 kg/m   Physical Exam Vitals and nursing note reviewed.  Constitutional:      General: She is not in acute distress.    Appearance: Normal appearance. She is well-developed and well-nourished.  HENT:     Head: Normocephalic and atraumatic.     Right Ear: Hearing normal.     Left Ear: Hearing normal.     Nose: Nose normal.     Mouth/Throat:     Mouth: Oropharynx is clear and moist and mucous membranes are normal.  Eyes:     Extraocular Movements: EOM normal.     Conjunctiva/sclera: Conjunctivae normal.     Pupils: Pupils are equal, round, and reactive to light.  Cardiovascular:     Rate and Rhythm: Regular rhythm.     Heart sounds: S1 normal and S2 normal. No murmur heard. No friction rub. No gallop.   Pulmonary:     Effort: Pulmonary effort is normal. No respiratory distress.     Breath sounds: Normal breath sounds. Decreased air  movement present.  Chest:     Chest wall: No tenderness.  Abdominal:     General: Bowel sounds are normal.     Palpations: Abdomen is soft. There is no hepatosplenomegaly.     Tenderness: There is no abdominal tenderness. There is no guarding or rebound. Negative signs include Murphy's sign and McBurney's sign.     Hernia: No hernia is present.  Musculoskeletal:        General: Normal range of motion.     Cervical back: Normal  range of motion and neck supple.  Skin:    General: Skin is warm, dry and intact.     Findings: No rash.     Nails: There is no cyanosis.  Neurological:     Mental Status: She is alert and oriented to person, place, and time.     GCS: GCS eye subscore is 4. GCS verbal subscore is 5. GCS motor subscore is 6.     Cranial Nerves: No cranial nerve deficit.     Sensory: No sensory deficit.     Coordination: Coordination normal.     Deep Tendon Reflexes: Strength normal.  Psychiatric:        Mood and Affect: Mood and affect normal.        Speech: Speech normal.        Behavior: Behavior normal.        Thought Content: Thought content normal.     ED Results / Procedures / Treatments   Labs (all labs ordered are listed, but only abnormal results are displayed) Labs Reviewed  CBC WITH DIFFERENTIAL/PLATELET - Abnormal; Notable for the following components:      Result Value   WBC 14.0 (*)    Neutro Abs 11.8 (*)    Abs Immature Granulocytes 0.08 (*)    All other components within normal limits  COMPREHENSIVE METABOLIC PANEL - Abnormal; Notable for the following components:   Potassium 3.2 (*)    Chloride 94 (*)    Glucose, Bld 113 (*)    Albumin 3.4 (*)    AST 14 (*)    All other components within normal limits  RESP PANEL BY RT-PCR (RSV, FLU A&B, COVID)  [id number]    EKG None  Radiology DG Chest 2 View  Result Date: 09/30/2020 CLINICAL DATA:  Cough and fever for 4 days, tobacco abuse EXAM: CHEST - 2 VIEW COMPARISON:  None. FINDINGS: Frontal and  lateral views of the chest demonstrate an unremarkable cardiac silhouette. Hyperinflation and interstitial scarring consistent with emphysema. No airspace disease, effusion, or pneumothorax. No acute bony abnormalities. IMPRESSION: 1. Emphysema.  No acute intrathoracic process. Electronically Signed   By: Randa Ngo M.D.   On: 09/30/2020 19:27    Procedures Procedures (including critical care time)  Medications Ordered in ED Medications  albuterol (VENTOLIN HFA) 108 (90 Base) MCG/ACT inhaler 2 puff (has no administration in time range)  acetaminophen (TYLENOL) tablet 1,000 mg (has no administration in time range)  acetaminophen (TYLENOL) tablet 650 mg (650 mg Oral Given 09/30/20 1740)    ED Course  I have reviewed the triage vital signs and the nursing notes.  Pertinent labs & imaging results that were available during my care of the patient were reviewed by me and considered in my medical decision making (see chart for details).    MDM Rules/Calculators/A&P                          Patient presents to the emergency department with fever, nasal congestion, sinus pressure, sore throat and cough.  She does report a history of COPD but does not have any bronchospasm currently.  Oxygenation is normal.  Patient did have a T-max of 102.9.  Her Covid PCR as well as influenza are negative tonight.  Chest x-ray does not show evidence of pneumonia.  Lung auscultation reveals decreased air movement but otherwise unremarkable.  Lab work was normal other than white count of 14.  Patient appears well, nontoxic.  She will  be treated outpatient for COPD exacerbation with URI.   Final Clinical Impression(s) / ED Diagnoses Final diagnoses:  Upper respiratory tract infection, unspecified type  Chronic obstructive pulmonary disease with acute exacerbation ([place])    Rx / DC Orders ED Discharge Orders         Ordered    amoxicillin (AMOXIL) 500 MG capsule  2 times daily        10/01/20 0404     predniSONE (DELTASONE) 20 MG tablet  Daily with breakfast        10/01/20 0404           Orpah Greek, MD 10/01/20 0405

## 2021-01-14 ENCOUNTER — Emergency Department (HOSPITAL_COMMUNITY)
Admission: EM | Admit: 2021-01-14 | Discharge: 2021-01-14 | Disposition: A | Discharge status: Home / Self Care | Payer: Self-pay | Attending: Emergency Medicine | Admitting: Emergency Medicine

## 2021-01-14 ENCOUNTER — Encounter (HOSPITAL_COMMUNITY): Payer: Self-pay

## 2021-01-14 ENCOUNTER — Other Ambulatory Visit: Payer: Self-pay

## 2021-01-14 DIAGNOSIS — Z79899 Other long term (current) drug therapy: Secondary | ICD-10-CM | POA: Insufficient documentation

## 2021-01-14 DIAGNOSIS — M545 Low back pain, unspecified: Secondary | ICD-10-CM | POA: Insufficient documentation

## 2021-01-14 DIAGNOSIS — Z7982 Long term (current) use of aspirin: Secondary | ICD-10-CM | POA: Insufficient documentation

## 2021-01-14 DIAGNOSIS — F17210 Nicotine dependence, cigarettes, uncomplicated: Secondary | ICD-10-CM | POA: Insufficient documentation

## 2021-01-14 DIAGNOSIS — Z7951 Long term (current) use of inhaled steroids: Secondary | ICD-10-CM | POA: Insufficient documentation

## 2021-01-14 DIAGNOSIS — Z85818 Personal history of malignant neoplasm of other sites of lip, oral cavity, and pharynx: Secondary | ICD-10-CM | POA: Insufficient documentation

## 2021-01-14 DIAGNOSIS — J449 Chronic obstructive pulmonary disease, unspecified: Secondary | ICD-10-CM | POA: Insufficient documentation

## 2021-01-14 DIAGNOSIS — E039 Hypothyroidism, unspecified: Secondary | ICD-10-CM | POA: Insufficient documentation

## 2021-01-14 DIAGNOSIS — I10 Essential (primary) hypertension: Secondary | ICD-10-CM | POA: Insufficient documentation

## 2021-01-14 DIAGNOSIS — S39012A Strain of muscle, fascia and tendon of lower back, initial encounter: Secondary | ICD-10-CM

## 2021-01-14 LAB — URINALYSIS, ROUTINE W REFLEX MICROSCOPIC
Bilirubin Urine: NEGATIVE
Glucose, UA: NEGATIVE mg/dL
Ketones, ur: NEGATIVE mg/dL
Nitrite: NEGATIVE
Protein, ur: NEGATIVE mg/dL
Specific Gravity, Urine: 1.02 (ref 1.005–1.030)
pH: 5 (ref 5.0–8.0)

## 2021-01-14 MED ORDER — OXYCODONE-ACETAMINOPHEN 5-325 MG PO TABS
1.0000 | ORAL_TABLET | Freq: Once | ORAL | Status: AC
  Administered 2021-01-14: 1 via ORAL
  Filled 2021-01-14: qty 1

## 2021-01-14 MED ORDER — LIDOCAINE 5 % EX PTCH: 1.0000 | MEDICATED_PATCH | CUTANEOUS | 0 refills | Status: DC

## 2021-01-14 MED ORDER — OXYCODONE-ACETAMINOPHEN 5-325 MG PO TABS
1.0000 | ORAL_TABLET | ORAL | 0 refills | Status: DC | PRN
Start: 2021-01-14 — End: 2022-05-22

## 2021-01-14 MED ORDER — DIAZEPAM 5 MG PO TABS
5.0000 mg | ORAL_TABLET | Freq: Once | ORAL | Status: AC
  Administered 2021-01-14: 5 mg via ORAL
  Filled 2021-01-14: qty 1

## 2021-01-14 MED ORDER — DIAZEPAM 2 MG PO TABS: 2.0000 mg | ORAL_TABLET | Freq: Four times a day (QID) | ORAL | 0 refills | Status: DC | PRN

## 2021-01-14 NOTE — ED Provider Notes (Signed)
[facility] EMERGENCY DEPARTMENT Provider Note   CSN: [id number] Arrival date & time: 01/14/21  1519     History No chief complaint on file.   Zoe Snyder is a 65 y.o. female.  65 year old female presents lower back pain which has been for several days.  No bowel or bladder dysfunction.  Pain is been atraumatic and not associated with fever chills or urinary symptoms.  Patient states that she stands for prolonged periods of time at work and that is worse after she does this.  Gets better when she rests.  She is use over-the-counter medications without relief.        Past Medical History:  Diagnosis Date  . COPD (chronic obstructive pulmonary disease) ([place])   . Hashimoto's thyroiditis   . History of alcohol abuse 11/30/2017  . History of cocaine abuse ([place]) 11/30/2017  . History of colon polyps   . Hyperlipidemia   . Hypertension   . Prediabetes   . Throat cancer ([place])   . Tobacco use disorder     Patient Active Problem List   Diagnosis Date Noted  . Chronic obstructive pulmonary disease ([place]) 12/08/2017  . Heavy tobacco smoker >10 cigarettes per day 12/08/2017  . Unintended weight loss 12/08/2017  . Mixed hyperlipidemia 12/08/2017  . Hypertension goal BP (blood pressure) < 130/80 12/08/2017  . Prediabetes 12/08/2017  . Throat cancer ([place]) 12/08/2017  . History of cocaine abuse ([place]) 11/30/2017  . History of alcohol abuse 11/30/2017  . Refused influenza vaccine 11/29/2017  . History of colon polyps   . Olecranon bursitis of right elbow 02/03/2017  . 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 Subclinical hypothyroidism 05/06/2016  . Muscle cramps 04/29/2016  . Malignant neoplasm of larynx ([place]) 01/09/2016  . Dysphonia 03/21/2015  . Displaced comminuted fracture of shaft of right fibula, sequela 03/07/2015  . Anxiety disorder 01/16/2014  . Obesity 10/19/2012  . Dry eye syndrome 06/10/2009  . 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 Vitamin D deficiency  05/28/2009  . Sleep apnea 05/07/2008  . History of hepatitis C 01/02/2003  . Dyspnea 01/02/2003    Past Surgical History:  Procedure Laterality Date  . COLONOSCOPY W/ POLYPECTOMY  2016  . THROAT SURGERY  2018   cancer     OB History   No obstetric history on file.     Family History  Problem Relation Age of Onset  . Hypertension Mother   . Colon cancer Maternal Aunt   . Diabetes Maternal Grandmother     Social History   Tobacco Use  . Smoking status: Current Every Day Smoker    Packs/day: 0.50    Years: 45.00    Pack years: 22.50    Types: Cigarettes  . Smokeless tobacco: Never Used  . Tobacco comment: currently smoking 0.5 ppd, but she has cut down from over 1 ppd  Vaping Use  . Vaping Use: Never used  Substance Use Topics  . Alcohol use: No  . Drug use: No    Home Medications Prior to Admission medications   Medication Sig Start Date End Date Taking? Authorizing Provider  amLODipine (NORVASC) 10 MG tablet Take 1 tablet (10 mg total) by mouth daily. Due for follow up visit 09/11/20   Augusto Gamble B, NP  amoxicillin (AMOXIL) 500 MG capsule Take 2 capsules (1,000 mg total) by mouth 2 (two) times daily. 10/01/20   Orpah Greek, MD  aspirin EC 81 MG tablet Take 1 tablet by mouth daily. 01/07/17  [provider]  atorvastatin (LIPITOR) 20 MG tablet Take 1 tablet by mouth daily. 01/07/17   [provider]  Cholecalciferol (VITAMIN D-1000 MAX ST) 1000 units tablet Take 1 tablet by mouth daily. 01/07/17   [provider]  fluticasone (FLONASE) 50 MCG/ACT nasal spray Place into the nose. 01/08/17   [provider]  hydrochlorothiazide (HYDRODIURIL) 25 MG tablet Take 1 tablet (25 mg total) by mouth daily. Due for follow up visit 09/11/20   Augusto Gamble B, NP  ipratropium (ATROVENT) 0.06 % nasal spray Place 2 sprays into both nostrils 4 (four) times daily as needed for rhinitis. 09/11/20   Zigmund Gottron, NP  loratadine  (CLARITIN) 10 MG tablet Take by mouth. 01/08/17   [provider]  Multiple Vitamin (MULTI-VITAMIN) tablet Take by mouth. 05/08/16   [provider]  omeprazole (PRILOSEC) 20 MG capsule Take by mouth. 01/07/17   [provider]  predniSONE (DELTASONE) 20 MG tablet Take 2 tablets (40 mg total) by mouth daily with breakfast. 10/01/20   Pollina, Gwenyth Allegra, MD  umeclidinium bromide (INCRUSE ELLIPTA) 62.5 MCG/INH AEPB Inhale 1 puff into the lungs daily. 11/29/17   Trixie Dredge, PA-C  varenicline (CHANTIX PAK) 0.5 MG X 11 & 1 MG X 42 tablet Take one 0.5mg  tab once daily for first 3 days, then 0.5mg  tab twice daily for 4 days, then 1mg  tab twice daily. 01/07/17   [provider]    Allergies    Epinephrine  Review of Systems   Review of Systems  All other systems reviewed and are negative.   Physical Exam Updated Vital Signs BP (!) 156/106 (BP Location: Left Arm)   Pulse 82   Temp 98.6 F (37 C) (Oral)   Resp 18   SpO2 100%   Physical Exam Vitals and nursing note reviewed.  Constitutional:      General: She is not in acute distress.    Appearance: Normal appearance. She is well-developed. She is not toxic-appearing.  HENT:     Head: Normocephalic and atraumatic.  Eyes:     General: Lids are normal.     Conjunctiva/sclera: Conjunctivae normal.     Pupils: Pupils are equal, round, and reactive to light.  Neck:     Thyroid: No thyroid mass.     Trachea: No tracheal deviation.  Cardiovascular:     Rate and Rhythm: Normal rate and regular rhythm.     Heart sounds: Normal heart sounds. No murmur heard. No gallop.   Pulmonary:     Effort: Pulmonary effort is normal. No respiratory distress.     Breath sounds: Normal breath sounds. No stridor. No decreased breath sounds, wheezing, rhonchi or rales.  Abdominal:     General: Bowel sounds are normal. There is no distension.     Palpations: Abdomen is soft.     Tenderness: There is no  abdominal tenderness. There is no rebound.  Musculoskeletal:        General: No tenderness. Normal range of motion.     Cervical back: Normal range of motion and neck supple.       Back:  Skin:    General: Skin is warm and dry.     Findings: No abrasion or rash.  Neurological:     Mental Status: She is alert and oriented to person, place, and time.     GCS: GCS eye subscore is 4. GCS verbal subscore is 5. GCS motor subscore is 6.     Cranial  Nerves: No cranial nerve deficit.     Sensory: No sensory deficit.     Motor: Motor function is intact.     Coordination: Coordination is intact.     Gait: Gait is intact.  Psychiatric:        Speech: Speech normal.        Behavior: Behavior normal.     ED Results / Procedures / Treatments   Labs (all labs ordered are listed, but only abnormal results are displayed) Labs Reviewed  URINALYSIS, ROUTINE W REFLEX MICROSCOPIC - Abnormal; Notable for the following components:      Result Value   APPearance HAZY (*)    Hgb urine dipstick MODERATE (*)    Leukocytes,Ua SMALL (*)    Bacteria, UA FEW (*)    All other components within normal limits    EKG None  Radiology No results found.  Procedures Procedures   Medications Ordered in ED Medications  diazepam (VALIUM) tablet 5 mg (has no administration in time range)  oxyCODONE-acetaminophen (PERCOCET/ROXICET) 5-325 MG per tablet 1 tablet (has no administration in time range)    ED Course  I have reviewed the triage vital signs and the nursing notes.  Pertinent labs & imaging results that were available during my care of the patient were reviewed by me and considered in my medical decision making (see chart for details).    MDM Rules/Calculators/A&P                          Patient's urinalysis is likely contaminated.  She has no focal neurological findings.  Likely muscle skeletal back pain.  Will discharge Final Clinical Impression(s) / ED Diagnoses Final diagnoses:  None     Rx / DC Orders ED Discharge Orders    None       Lacretia Leigh, MD 01/14/21 1911

## 2021-01-14 NOTE — ED Triage Notes (Signed)
Patient complains of lower back pain ongoing and reports that she does a lot of lifting at work and feels a burning in her back. Ambulatory to triage

## 2021-01-14 NOTE — ED Notes (Signed)
Pt states she has a ride home. 

## 2021-01-14 NOTE — ED Triage Notes (Signed)
Emergency Medicine Provider Triage Evaluation Note  Zoe Snyder , a 65 y.o. female  was evaluated in triage.  Pt complains of low back pain x1 month. Patient describes pain as a burning sensation. Unsure if she is having any urinary symptoms. She lifts heavy things at work. She has tried a muscle relaxer with no relief.  Denies saddle paresthesias, bowel/bladder incontinence, lower extremity numbness/tingling, lower extremity weakness, fever/chills, and history of cancer.  Review of Systems  Positive: Low back pain Negative: fever  Physical Exam  BP (!) 156/106 (BP Location: Left Arm)   Pulse 82   Temp 98.6 F (37 C) (Oral)   Resp 18   SpO2 100%  Gen:   Awake, no distress   HEENT:  Atraumatic  Resp:  Normal effort  Cardiac:  Normal rate  Abd:   Nondistended, nontender  MSK:   Moves extremities without difficulty. No thoracic or lumbar midline tenderness. Reproducible tenderness in upper lumbar/lower thoracic paraspinal region. Neuro:  Speech clear   Medical Decision Making  Medically screening exam initiated at 4:09 PM.  Appropriate orders placed.  Brown Human Raigoza was informed that the remainder of the evaluation will be completed by another provider, this initial triage assessment does not replace that evaluation, and the importance of remaining in the ED until their evaluation is complete.  Clinical Impression  Low back pain. Likely MSK etiology. Doubt cauda equina/central cord compression. UA to rule out urinary etiology.   Suzy Bouchard, [place] 01/14/21 1612

## 2021-01-18 ENCOUNTER — Other Ambulatory Visit: Payer: Self-pay

## 2021-01-18 ENCOUNTER — Encounter (HOSPITAL_COMMUNITY): Payer: Self-pay | Admitting: Emergency Medicine

## 2021-01-18 ENCOUNTER — Emergency Department (HOSPITAL_COMMUNITY): Payer: Self-pay

## 2021-01-18 ENCOUNTER — Emergency Department (HOSPITAL_COMMUNITY)
Admission: EM | Admit: 2021-01-18 | Discharge: 2021-01-18 | Disposition: A | Discharge status: Home / Self Care | Payer: Self-pay | Attending: Emergency Medicine | Admitting: Emergency Medicine

## 2021-01-18 DIAGNOSIS — Z79899 Other long term (current) drug therapy: Secondary | ICD-10-CM | POA: Insufficient documentation

## 2021-01-18 DIAGNOSIS — Z7982 Long term (current) use of aspirin: Secondary | ICD-10-CM | POA: Insufficient documentation

## 2021-01-18 DIAGNOSIS — I10 Essential (primary) hypertension: Secondary | ICD-10-CM | POA: Insufficient documentation

## 2021-01-18 DIAGNOSIS — J449 Chronic obstructive pulmonary disease, unspecified: Secondary | ICD-10-CM | POA: Insufficient documentation

## 2021-01-18 DIAGNOSIS — Z85818 Personal history of malignant neoplasm of other sites of lip, oral cavity, and pharynx: Secondary | ICD-10-CM | POA: Insufficient documentation

## 2021-01-18 DIAGNOSIS — F17210 Nicotine dependence, cigarettes, uncomplicated: Secondary | ICD-10-CM | POA: Insufficient documentation

## 2021-01-18 DIAGNOSIS — M546 Pain in thoracic spine: Secondary | ICD-10-CM | POA: Insufficient documentation

## 2021-01-18 DIAGNOSIS — M545 Low back pain, unspecified: Secondary | ICD-10-CM | POA: Insufficient documentation

## 2021-01-18 DIAGNOSIS — Z7952 Long term (current) use of systemic steroids: Secondary | ICD-10-CM | POA: Insufficient documentation

## 2021-01-18 LAB — COMPREHENSIVE METABOLIC PANEL
ALT: 15 U/L (ref 0–44)
AST: 21 U/L (ref 15–41)
Albumin: 3.8 g/dL (ref 3.5–5.0)
Alkaline Phosphatase: 85 U/L (ref 38–126)
Anion gap: 7 (ref 5–15)
BUN: 27 mg/dL — ABNORMAL HIGH (ref 8–23)
CO2: 30 mmol/L (ref 22–32)
Calcium: 9.5 mg/dL (ref 8.9–10.3)
Chloride: 100 mmol/L (ref 98–111)
Creatinine, Ser: 0.92 mg/dL (ref 0.44–1.00)
GFR, Estimated: >60 mL/min (ref >60)
Glucose, Bld: 151 mg/dL — ABNORMAL HIGH (ref 70–99)
Potassium: 3.7 mmol/L (ref 3.5–5.1)
Sodium: 137 mmol/L (ref 135–145)
Total Bilirubin: 0.7 mg/dL (ref 0.3–1.2)
Total Protein: 6.9 g/dL (ref 6.5–8.1)

## 2021-01-18 LAB — URINALYSIS, ROUTINE W REFLEX MICROSCOPIC
Bacteria, UA: NONE SEEN
Bilirubin Urine: NEGATIVE
Glucose, UA: NEGATIVE mg/dL
Ketones, ur: NEGATIVE mg/dL
Nitrite: NEGATIVE
Protein, ur: NEGATIVE mg/dL
Specific Gravity, Urine: 1.017 (ref 1.005–1.030)
pH: 5 (ref 5.0–8.0)

## 2021-01-18 LAB — CBC WITH DIFFERENTIAL/PLATELET
Abs Immature Granulocytes: 0.05 10*3/uL (ref 0.00–0.07)
Basophils Absolute: 0.1 10*3/uL (ref 0.0–0.1)
Basophils Relative: 1 %
Eosinophils Absolute: 0.1 10*3/uL (ref 0.0–0.5)
Eosinophils Relative: 1 %
HCT: 44.3 % (ref 36.0–46.0)
Hemoglobin: 15 g/dL (ref 12.0–15.0)
Immature Granulocytes: 1 %
Lymphocytes Relative: 18 %
Lymphs Abs: 2 10*3/uL (ref 0.7–4.0)
MCH: 30.9 pg (ref 26.0–34.0)
MCHC: 33.9 g/dL (ref 30.0–36.0)
MCV: 91.3 fL (ref 80.0–100.0)
Monocytes Absolute: 0.8 10*3/uL (ref 0.1–1.0)
Monocytes Relative: 7 %
Neutro Abs: 8.1 10*3/uL — ABNORMAL HIGH (ref 1.7–7.7)
Neutrophils Relative %: 72 %
Platelets: 277 10*3/uL (ref 150–400)
RBC: 4.85 MIL/uL (ref 3.87–5.11)
RDW: 12.9 % (ref 11.5–15.5)
WBC: 11 10*3/uL — ABNORMAL HIGH (ref 4.0–10.5)
nRBC: 0 % (ref 0.0–0.2)

## 2021-01-18 LAB — LIPASE, BLOOD: Lipase: 33 U/L (ref 11–51)

## 2021-01-18 MED ORDER — KETOROLAC TROMETHAMINE 15 MG/ML IJ SOLN
15.0000 mg | Freq: Once | INTRAMUSCULAR | Status: DC
  Filled 2021-01-18: qty 1

## 2021-01-18 NOTE — ED Provider Notes (Incomplete)
Pt seen by Dr Ron Parker.  Please see his note.  Plan is to follow up on CT scan

## 2021-01-18 NOTE — ED Provider Notes (Signed)
[facility] EMERGENCY DEPARTMENT Provider Note   CSN: [id number] Arrival date & time: 01/18/21  1208     History No chief complaint on file.   Zoe Snyder is a 65 y.o. female.   Back Pain Location:  Lumbar spine and thoracic spine Quality:  Aching and shooting Radiates to: up the spine. Pain severity:  Severe Onset quality:  Gradual Duration:  2 weeks Timing:  Constant Progression:  Worsening Relieved by:  Nothing Exacerbated by: bending forward and standing up. Ineffective treatments: percocet. Associated symptoms: no abdominal swelling, no bladder incontinence, no bowel incontinence, no chest pain, no dysuria, no fever, no headaches, no numbness, no paresthesias, no pelvic pain, no perianal numbness and no weakness        Past Medical History:  Diagnosis Date  . COPD (chronic obstructive pulmonary disease) ([place])   . Hashimoto's thyroiditis   . History of alcohol abuse 11/30/2017  . History of cocaine abuse ([place]) 11/30/2017  . History of colon polyps   . Hyperlipidemia   . Hypertension   . Prediabetes   . Throat cancer ([place])   . Tobacco use disorder     Patient Active Problem List   Diagnosis Date Noted  . Chronic obstructive pulmonary disease ([place]) 12/08/2017  . Heavy tobacco smoker >10 cigarettes per day 12/08/2017  . Unintended weight loss 12/08/2017  . Mixed hyperlipidemia 12/08/2017  . Hypertension goal BP (blood pressure) < 130/80 12/08/2017  . Prediabetes 12/08/2017  . Throat cancer ([place]) 12/08/2017  . History of cocaine abuse ([place]) 11/30/2017  . History of alcohol abuse 11/30/2017  . Refused influenza vaccine 11/29/2017  . History of colon polyps   . Olecranon bursitis of right elbow 02/03/2017  . 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 Subclinical hypothyroidism 05/06/2016  . Muscle cramps 04/29/2016  . Malignant neoplasm of larynx ([place]) 01/09/2016  . Dysphonia 03/21/2015  . Displaced comminuted fracture of shaft of right  fibula, sequela 03/07/2015  . Anxiety disorder 01/16/2014  . Obesity 10/19/2012  . Dry eye syndrome 06/10/2009  . 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 Vitamin D deficiency 05/28/2009  . Sleep apnea 05/07/2008  . History of hepatitis C 01/02/2003  . Dyspnea 01/02/2003    Past Surgical History:  Procedure Laterality Date  . COLONOSCOPY W/ POLYPECTOMY  2016  . THROAT SURGERY  2018   cancer     OB History   No obstetric history on file.     Family History  Problem Relation Age of Onset  . Hypertension Mother   . Colon cancer Maternal Aunt   . Diabetes Maternal Grandmother     Social History   Tobacco Use  . Smoking status: Current Every Day Smoker    Packs/day: 0.50    Years: 45.00    Pack years: 22.50    Types: Cigarettes  . Smokeless tobacco: Never Used  . Tobacco comment: currently smoking 0.5 ppd, but she has cut down from over 1 ppd  Vaping Use  . Vaping Use: Never used  Substance Use Topics  . Alcohol use: No  . Drug use: No    Home Medications Prior to Admission medications   Medication Sig Start Date End Date Taking? Authorizing Provider  amLODipine (NORVASC) 10 MG tablet Take 1 tablet (10 mg total) by mouth daily. Due for follow up visit 09/11/20   Augusto Gamble B, NP  amoxicillin (AMOXIL) 500 MG capsule Take 2 capsules (1,000 mg total) by mouth 2 (two) times daily. 10/01/20   Pollina,  Gwenyth Allegra, MD  aspirin EC 81 MG tablet Take 1 tablet by mouth daily. 01/07/17   [provider]  atorvastatin (LIPITOR) 20 MG tablet Take 1 tablet by mouth daily. 01/07/17   [provider]  Cholecalciferol (VITAMIN D-1000 MAX ST) 1000 units tablet Take 1 tablet by mouth daily. 01/07/17   [provider]  diazepam (VALIUM) 2 MG tablet Take 1 tablet (2 mg total) by mouth every 6 (six) hours as needed for muscle spasms. 01/14/21   Lacretia Leigh, MD  fluticasone [facility]) 50 MCG/ACT nasal spray Place into the nose. 01/08/17   [provider]   hydrochlorothiazide (HYDRODIURIL) 25 MG tablet Take 1 tablet (25 mg total) by mouth daily. Due for follow up visit 09/11/20   Augusto Gamble B, NP  ipratropium (ATROVENT) 0.06 % nasal spray Place 2 sprays into both nostrils 4 (four) times daily as needed for rhinitis. 09/11/20   Augusto Gamble B, NP  lidocaine (LIDODERM) 5 % Place 1 patch onto the skin daily. Remove & Discard patch within 12 hours or as directed by MD 01/14/21   Lacretia Leigh, MD  loratadine (CLARITIN) 10 MG tablet Take by mouth. 01/08/17   [provider]  Multiple Vitamin (MULTI-VITAMIN) tablet Take by mouth. 05/08/16   [provider]  omeprazole (PRILOSEC) 20 MG capsule Take by mouth. 01/07/17   [provider]  oxyCODONE-acetaminophen (PERCOCET/ROXICET) 5-325 MG tablet Take 1-2 tablets by mouth every 4 (four) hours as needed for severe pain. 01/14/21   Lacretia Leigh, MD  predniSONE (DELTASONE) 20 MG tablet Take 2 tablets (40 mg total) by mouth daily with breakfast. 10/01/20   Pollina, Gwenyth Allegra, MD  umeclidinium bromide (INCRUSE ELLIPTA) 62.5 MCG/INH AEPB Inhale 1 puff into the lungs daily. 11/29/17   Trixie Dredge, PA-C  varenicline (CHANTIX PAK) 0.5 MG X 11 & 1 MG X 42 tablet Take one 0.5mg  tab once daily for first 3 days, then 0.5mg  tab twice daily for 4 days, then 1mg  tab twice daily. 01/07/17   [provider]    Allergies    Epinephrine  Review of Systems   Review of Systems  Constitutional: Negative for chills and fever.  HENT: Negative for congestion and rhinorrhea.   Respiratory: Negative for cough and shortness of breath.   Cardiovascular: Negative for chest pain and palpitations.  Gastrointestinal: Negative for bowel incontinence, diarrhea, nausea and vomiting.  Genitourinary: Negative for bladder incontinence, difficulty urinating, dysuria and pelvic pain.  Musculoskeletal: Positive for back pain. Negative for arthralgias.  Skin: Negative for rash and wound.   Neurological: Negative for weakness, light-headedness, numbness, headaches and paresthesias.    Physical Exam Updated Vital Signs BP (!) 164/93   Pulse 73   Temp 98.2 F (36.8 C) (Oral)   Resp 19   SpO2 93%   Physical Exam Vitals and nursing note reviewed. Exam conducted with a chaperone present.  Constitutional:      General: She is not in acute distress.    Appearance: Normal appearance.  HENT:     Head: Normocephalic and atraumatic.     Nose: No rhinorrhea.  Eyes:     General:        Right eye: No discharge.        Left eye: No discharge.     Conjunctiva/sclera: Conjunctivae normal.  Cardiovascular:     Rate and Rhythm: Normal rate and regular rhythm.  Pulmonary:     Effort: Pulmonary effort is normal. No respiratory distress.  Breath sounds: No stridor.  Abdominal:     General: Abdomen is flat. There is no distension.     Palpations: Abdomen is soft.  Musculoskeletal:        General: Tenderness (ttp of the entire spine and surrounding paraspinal muscles) present. No swelling, deformity or signs of injury. Normal range of motion.  Skin:    General: Skin is warm and dry.  Neurological:     General: No focal deficit present.     Mental Status: She is alert. Mental status is at baseline.     Motor: No weakness.     Gait: Gait normal.     Deep Tendon Reflexes: Reflexes normal.  Psychiatric:        Mood and Affect: Mood normal.        Behavior: Behavior normal.     ED Results / Procedures / Treatments   Labs (all labs ordered are listed, but only abnormal results are displayed) Labs Reviewed  CBC WITH DIFFERENTIAL/PLATELET - Abnormal; Notable for the following components:      Result Value   WBC 11.0 (*)    Neutro Abs 8.1 (*)    All other components within normal limits  COMPREHENSIVE METABOLIC PANEL - Abnormal; Notable for the following components:   Glucose, Bld 151 (*)    BUN 27 (*)    All other components within normal limits  URINALYSIS, ROUTINE W  REFLEX MICROSCOPIC - Abnormal; Notable for the following components:   APPearance HAZY (*)    Hgb urine dipstick MODERATE (*)    Leukocytes,Ua SMALL (*)    All other components within normal limits  LIPASE, BLOOD    EKG None  Radiology CT Renal Stone Study  Result Date: 01/18/2021 CLINICAL DATA:  Bilateral flank pain and hematuria. EXAM: CT ABDOMEN AND PELVIS WITHOUT CONTRAST TECHNIQUE: Multidetector CT imaging of the abdomen and pelvis was performed following the standard protocol without IV contrast. COMPARISON:  None. FINDINGS: Lower chest: Subtle ill-defined small centrilobular ground-glass nodules in the right greater than left lower lobes. Hepatobiliary: No focal liver abnormality is seen. No gallstones, gallbladder wall thickening, or biliary dilatation. Pancreas: Unremarkable. No pancreatic ductal dilatation or surrounding inflammatory changes. Spleen: Normal in size without focal abnormality. Adrenals/Urinary Tract: 1.1 cm left adrenal adenoma. The right adrenal gland is unremarkable. No renal calculi or hydronephrosis. The bladder is unremarkable for the degree of distention. Stomach/Bowel: Small hiatal hernia. The stomach is otherwise within normal limits. No bowel wall thickening, distention, or surrounding inflammatory changes. Moderate sigmoid colonic diverticulosis. Normal appendix. Vascular/Lymphatic: Aortic atherosclerosis. No enlarged abdominal or pelvic lymph nodes. Reproductive: Uterus and bilateral adnexa are unremarkable. Other: No free fluid or pneumoperitoneum. Musculoskeletal: No acute or significant osseous findings. IMPRESSION: 1. No acute intra-abdominal process. No urolithiasis. 2. Subtle ill-defined small centrilobular ground-glass nodules in the right greater than left lower lobes, possibly a smoking-related respiratory bronchiolitis. 3. 1.1 cm left adrenal adenoma. 4. Aortic Atherosclerosis (ICD10-I70.0). Electronically Signed   By: Titus Dubin M.D.   On: 01/18/2021  15:17    Procedures Procedures   Medications Ordered in ED Medications - No data to display  ED Course  I have reviewed the triage vital signs and the nursing notes.  Pertinent labs & imaging results that were available during my care of the patient were reviewed by me and considered in my medical decision making (see chart for details).    MDM Rules/Calculators/A&P  Patient here for second evaluation in few days for back pain.  Worse with bending forward and standing back up.  No neurologic dysfunction.  Says the pain shoots up her spine.  No fevers chills.  Urinating normally.  Normal bowel movements.  Was seen here today ago and had mild hematuria but was not given any other diagnosis.  Per review of documentation it seems that the thought is musculoskeletal.  I agree with this evaluation based on exam findings.  Urinalysis does show blood but no infection signs or symptoms I do not think she needs other follow-up for that currently but needs outpatient follow-up with primary care urology to evaluate that further.  Kidney function appears normal per laboratory studies.  Will get CT imaging to evaluate for nephroureterolithiasis.  Patient is having normal bowel movements as a nonperitoneal ache abdomen.  She has no neurologic dysfunction in the lower extremities.  If CT imaging is negative.  She is already given prescription for narcotic pain control and told to follow-up with primary care and specialties.  She can also supplement the chronic pain medication with over-the-counter medications.  CT imaging reviewed by myself shows no acute fracture or malalignment of the spine.  No renal calculi.  No other pathology that would explain patient's pain.  I feel her pain is muscular.  Perhaps she has some form of sciatic nerve pain.  Her pain description is not consistent with typical back pain.  She has full range of motion she is able to ambulate get herself in and out of  bed.  When I returned to check on her after pain medications were given she was sleeping comfortably.  When I woke her explained this to her she understands the plan and she will follow up with neurosurgery for evaluation of her spine.  Incidental findings are discussed outpatient follow-up recommended return precautions given  Final Clinical Impression(s) / ED Diagnoses Final diagnoses:  Low back pain, unspecified back pain laterality, unspecified chronicity, unspecified whether sciatica present    Rx / DC Orders ED Discharge Orders    None       Breck Coons, MD 01/19/21 [phone]

## 2021-01-18 NOTE — ED Notes (Signed)
Patient discharge instructions reviewed with the patient. The patient verbalized understanding of instructions. Patient discharged. 

## 2021-01-18 NOTE — ED Triage Notes (Signed)
Pt reports lower back pain x 1 month.  Reports hematuria.  Seen in ED on 4/5.  Taking pain medication without relief.

## 2021-01-18 NOTE — ED Provider Notes (Incomplete)
[facility] EMERGENCY DEPARTMENT Provider Note   CSN: [id number] Arrival date & time: 01/18/21  1208     History No chief complaint on file.   AMAURIA Snyder is a 65 y.o. female.  HPI     Past Medical History:  Diagnosis Date  . COPD (chronic obstructive pulmonary disease) ([place])   . Hashimoto's thyroiditis   . History of alcohol abuse 11/30/2017  . History of cocaine abuse ([place]) 11/30/2017  . History of colon polyps   . Hyperlipidemia   . Hypertension   . Prediabetes   . Throat cancer ([place])   . Tobacco use disorder     Patient Active Problem List   Diagnosis Date Noted  . Chronic obstructive pulmonary disease ([place]) 12/08/2017  . Heavy tobacco smoker >10 cigarettes per day 12/08/2017  . Unintended weight loss 12/08/2017  . Mixed hyperlipidemia 12/08/2017  . Hypertension goal BP (blood pressure) < 130/80 12/08/2017  . Prediabetes 12/08/2017  . Throat cancer ([place]) 12/08/2017  . History of cocaine abuse ([place]) 11/30/2017  . History of alcohol abuse 11/30/2017  . Refused influenza vaccine 11/29/2017  . History of colon polyps   . Olecranon bursitis of right elbow 02/03/2017  . 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 Subclinical hypothyroidism 05/06/2016  . Muscle cramps 04/29/2016  . Malignant neoplasm of larynx ([place]) 01/09/2016  . Dysphonia 03/21/2015  . Displaced comminuted fracture of shaft of right fibula, sequela 03/07/2015  . Anxiety disorder 01/16/2014  . Obesity 10/19/2012  . Dry eye syndrome 06/10/2009  . 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 Vitamin D deficiency 05/28/2009  . Sleep apnea 05/07/2008  . History of hepatitis C 01/02/2003  . Dyspnea 01/02/2003    Past Surgical History:  Procedure Laterality Date  . COLONOSCOPY W/ POLYPECTOMY  2016  . THROAT SURGERY  2018   cancer     OB History   No obstetric history on file.     Family History  Problem Relation Age of Onset  . Hypertension Mother   . Colon cancer Maternal Aunt   .  Diabetes Maternal Grandmother     Social History   Tobacco Use  . Smoking status: Current Every Day Smoker    Packs/day: 0.50    Years: 45.00    Pack years: 22.50    Types: Cigarettes  . Smokeless tobacco: Never Used  . Tobacco comment: currently smoking 0.5 ppd, but she has cut down from over 1 ppd  Vaping Use  . Vaping Use: Never used  Substance Use Topics  . Alcohol use: No  . Drug use: No    Home Medications Prior to Admission medications   Medication Sig Start Date End Date Taking? Authorizing Provider  amLODipine (NORVASC) 10 MG tablet Take 1 tablet (10 mg total) by mouth daily. Due for follow up visit 09/11/20   Augusto Gamble B, NP  amoxicillin (AMOXIL) 500 MG capsule Take 2 capsules (1,000 mg total) by mouth 2 (two) times daily. 10/01/20   Orpah Greek, MD  aspirin EC 81 MG tablet Take 1 tablet by mouth daily. 01/07/17   [provider]  atorvastatin (LIPITOR) 20 MG tablet Take 1 tablet by mouth daily. 01/07/17   [provider]  Cholecalciferol (VITAMIN D-1000 MAX ST) 1000 units tablet Take 1 tablet by mouth daily. 01/07/17   [provider]  diazepam (VALIUM) 2 MG tablet Take 1 tablet (2 mg total) by mouth every 6 (six) hours as needed for muscle spasms. 01/14/21   Zenia Resides,  Elberta Fortis, MD  fluticasone [facility]) 50 MCG/ACT nasal spray Place into the nose. 01/08/17   [provider]  hydrochlorothiazide (HYDRODIURIL) 25 MG tablet Take 1 tablet (25 mg total) by mouth daily. Due for follow up visit 09/11/20   Augusto Gamble B, NP  ipratropium (ATROVENT) 0.06 % nasal spray Place 2 sprays into both nostrils 4 (four) times daily as needed for rhinitis. 09/11/20   Augusto Gamble B, NP  lidocaine (LIDODERM) 5 % Place 1 patch onto the skin daily. Remove & Discard patch within 12 hours or as directed by MD 01/14/21   Lacretia Leigh, MD  loratadine (CLARITIN) 10 MG tablet Take by mouth. 01/08/17   [provider]  Multiple Vitamin (MULTI-VITAMIN)  tablet Take by mouth. 05/08/16   [provider]  omeprazole (PRILOSEC) 20 MG capsule Take by mouth. 01/07/17   [provider]  oxyCODONE-acetaminophen (PERCOCET/ROXICET) 5-325 MG tablet Take 1-2 tablets by mouth every 4 (four) hours as needed for severe pain. 01/14/21   Lacretia Leigh, MD  predniSONE (DELTASONE) 20 MG tablet Take 2 tablets (40 mg total) by mouth daily with breakfast. 10/01/20   Pollina, Gwenyth Allegra, MD  umeclidinium bromide (INCRUSE ELLIPTA) 62.5 MCG/INH AEPB Inhale 1 puff into the lungs daily. 11/29/17   Trixie Dredge, PA-C  varenicline (CHANTIX PAK) 0.5 MG X 11 & 1 MG X 42 tablet Take one 0.5mg  tab once daily for first 3 days, then 0.5mg  tab twice daily for 4 days, then 1mg  tab twice daily. 01/07/17   [provider]    Allergies    Epinephrine  Review of Systems   Review of Systems  Physical Exam Updated Vital Signs BP (!) 166/108 (BP Location: Right Arm)   Pulse 97   Temp 98.4 F (36.9 C) (Oral)   Resp 16   SpO2 98%   Physical Exam  ED Results / Procedures / Treatments   Labs (all labs ordered are listed, but only abnormal results are displayed) Labs Reviewed - No data to display  EKG None  Radiology No results found.  Procedures Procedures {Remember to document critical care time when appropriate:1}  Medications Ordered in ED Medications - No data to display  ED Course  I have reviewed the triage vital signs and the nursing notes.  Pertinent labs & imaging results that were available during my care of the patient were reviewed by me and considered in my medical decision making (see chart for details).    MDM Rules/Calculators/A&P                          Bilateral back pain with recent hematuria   *** Final Clinical Impression(s) / ED Diagnoses Final diagnoses:  None    Rx / DC Orders ED Discharge Orders    None

## 2021-01-18 NOTE — Discharge Instructions (Addendum)
You use the narcotic pain control for severe pain.  Use Tylenol naprosyn for mild to moderate pain.

## 2021-01-18 NOTE — ED Notes (Signed)
Patient transported to CT 

## 2021-08-04 ENCOUNTER — Emergency Department (HOSPITAL_COMMUNITY)
Admission: EM | Admit: 2021-08-04 | Discharge: 2021-08-05 | Disposition: A | Discharge status: Home / Self Care | Payer: Self-pay | Attending: Emergency Medicine | Admitting: Emergency Medicine

## 2021-08-04 DIAGNOSIS — Z7951 Long term (current) use of inhaled steroids: Secondary | ICD-10-CM | POA: Insufficient documentation

## 2021-08-04 DIAGNOSIS — J449 Chronic obstructive pulmonary disease, unspecified: Secondary | ICD-10-CM | POA: Insufficient documentation

## 2021-08-04 DIAGNOSIS — Z8521 Personal history of malignant neoplasm of larynx: Secondary | ICD-10-CM | POA: Insufficient documentation

## 2021-08-04 DIAGNOSIS — Z7982 Long term (current) use of aspirin: Secondary | ICD-10-CM | POA: Insufficient documentation

## 2021-08-04 DIAGNOSIS — I10 Essential (primary) hypertension: Secondary | ICD-10-CM | POA: Insufficient documentation

## 2021-08-04 DIAGNOSIS — Z79899 Other long term (current) drug therapy: Secondary | ICD-10-CM | POA: Insufficient documentation

## 2021-08-04 DIAGNOSIS — J181 Lobar pneumonia, unspecified organism: Secondary | ICD-10-CM | POA: Insufficient documentation

## 2021-08-04 DIAGNOSIS — Z20822 Contact with and (suspected) exposure to covid-19: Secondary | ICD-10-CM | POA: Insufficient documentation

## 2021-08-04 DIAGNOSIS — R197 Diarrhea, unspecified: Secondary | ICD-10-CM | POA: Insufficient documentation

## 2021-08-04 DIAGNOSIS — J189 Pneumonia, unspecified organism: Secondary | ICD-10-CM

## 2021-08-04 DIAGNOSIS — F17210 Nicotine dependence, cigarettes, uncomplicated: Secondary | ICD-10-CM | POA: Insufficient documentation

## 2021-08-05 ENCOUNTER — Other Ambulatory Visit: Payer: Self-pay

## 2021-08-05 ENCOUNTER — Encounter (HOSPITAL_COMMUNITY): Payer: Self-pay | Admitting: Emergency Medicine

## 2021-08-05 ENCOUNTER — Emergency Department (HOSPITAL_COMMUNITY): Payer: Self-pay

## 2021-08-05 LAB — COMPREHENSIVE METABOLIC PANEL
ALT: 135 U/L — ABNORMAL HIGH (ref 0–44)
AST: 65 U/L — ABNORMAL HIGH (ref 15–41)
Albumin: 3.4 g/dL — ABNORMAL LOW (ref 3.5–5.0)
Alkaline Phosphatase: 65 U/L (ref 38–126)
Anion gap: 12 (ref 5–15)
BUN: 20 mg/dL (ref 8–23)
CO2: 21 mmol/L — ABNORMAL LOW (ref 22–32)
Calcium: 8.9 mg/dL (ref 8.9–10.3)
Chloride: 95 mmol/L — ABNORMAL LOW (ref 98–111)
Creatinine, Ser: 1.17 mg/dL — ABNORMAL HIGH (ref 0.44–1.00)
GFR, Estimated: 52 mL/min — ABNORMAL LOW (ref >60)
Glucose, Bld: 133 mg/dL — ABNORMAL HIGH (ref 70–99)
Potassium: 3.2 mmol/L — ABNORMAL LOW (ref 3.5–5.1)
Sodium: 128 mmol/L — ABNORMAL LOW (ref 135–145)
Total Bilirubin: 1.1 mg/dL (ref 0.3–1.2)
Total Protein: 7.7 g/dL (ref 6.5–8.1)

## 2021-08-05 LAB — CBC
HCT: 40.3 % (ref 36.0–46.0)
Hemoglobin: 14 g/dL (ref 12.0–15.0)
MCH: 31.2 pg (ref 26.0–34.0)
MCHC: 34.7 g/dL (ref 30.0–36.0)
MCV: 89.8 fL (ref 80.0–100.0)
Platelets: 294 10*3/uL (ref 150–400)
RBC: 4.49 MIL/uL (ref 3.87–5.11)
RDW: 13.4 % (ref 11.5–15.5)
WBC: 10.9 10*3/uL — ABNORMAL HIGH (ref 4.0–10.5)
nRBC: 0 % (ref 0.0–0.2)

## 2021-08-05 LAB — RESP PANEL BY RT-PCR (FLU A&B, COVID) ARPGX2
Influenza A by PCR: NEGATIVE
Influenza B by PCR: NEGATIVE
SARS Coronavirus 2 by RT PCR: NEGATIVE

## 2021-08-05 MED ORDER — AMLODIPINE BESYLATE 5 MG PO TABS
10.0000 mg | ORAL_TABLET | Freq: Once | ORAL | Status: AC
  Administered 2021-08-05: 10 mg via ORAL
  Filled 2021-08-05: qty 2

## 2021-08-05 MED ORDER — LEVOFLOXACIN 500 MG PO TABS: 500.0000 mg | ORAL_TABLET | Freq: Every day | ORAL | 0 refills | Status: DC

## 2021-08-05 MED ORDER — ALBUTEROL SULFATE HFA 108 (90 BASE) MCG/ACT IN AERS
2.0000 | INHALATION_SPRAY | RESPIRATORY_TRACT | Status: DC | PRN
  Filled 2021-08-05 (×2): qty 6.7

## 2021-08-05 MED ORDER — AEROCHAMBER Z-STAT PLUS/MEDIUM MISC
1.0000 | Freq: Once | Status: DC
  Filled 2021-08-05: qty 1

## 2021-08-05 MED ORDER — DOXYCYCLINE HYCLATE 100 MG PO TABS: 100.0000 mg | ORAL_TABLET | Freq: Once | ORAL | Status: DC

## 2021-08-05 MED ORDER — LEVOFLOXACIN 500 MG PO TABS
500.0000 mg | ORAL_TABLET | Freq: Once | ORAL | Status: AC
  Administered 2021-08-05: 500 mg via ORAL
  Filled 2021-08-05: qty 1

## 2021-08-05 MED ORDER — POTASSIUM CHLORIDE CRYS ER 20 MEQ PO TBCR
40.0000 meq | EXTENDED_RELEASE_TABLET | Freq: Once | ORAL | Status: AC
  Administered 2021-08-05: 40 meq via ORAL
  Filled 2021-08-05: qty 2

## 2021-08-05 MED ORDER — AMLODIPINE BESYLATE 10 MG PO TABS
10.0000 mg | ORAL_TABLET | Freq: Every day | ORAL | 1 refills | Status: AC
Start: 2021-08-05 — End: ?

## 2021-08-05 NOTE — ED Provider Notes (Signed)
[facility] DEPT Provider Note   CSN: [id number] Arrival date & time: 08/04/21  2312     History Chief Complaint  Patient presents with   Cough   Diarrhea    Zoe Snyder is a 65 y.o. female.  HPI 65 year old female presents with cough.  She has been feeling poorly since last week, about 9 days ago.  Started off having cough with clear sputum, fever, and diarrhea when coughing.  She states she is always somewhat short of breath and maybe a little bit more so.  She is actually feeling better after being pretty ill for about a week but because she still symptomatic and finally was able to get a ride she came to the ER yesterday evening.  She notes that she has not been on her blood pressure meds for over 9 months due to cost and being unable to follow-up with her doctor.  Past Medical History:  Diagnosis Date   COPD (chronic obstructive pulmonary disease) ([place])    Hashimoto's thyroiditis    History of alcohol abuse 11/30/2017   History of cocaine abuse ([place]) 11/30/2017   History of colon polyps    Hyperlipidemia    Hypertension    Prediabetes    Throat cancer ([place])    Tobacco use disorder     Patient Active Problem List   Diagnosis Date Noted   Chronic obstructive pulmonary disease ([place]) 12/08/2017   Heavy tobacco smoker >10 cigarettes per day 12/08/2017   Unintended weight loss 12/08/2017   Mixed hyperlipidemia 12/08/2017   Hypertension goal BP (blood pressure) < 130/80 12/08/2017   Prediabetes 12/08/2017   Throat cancer ([place]) 12/08/2017   History of cocaine abuse ([place]) 11/30/2017   History of alcohol abuse 11/30/2017   Refused influenza vaccine 11/29/2017   History of colon polyps    Olecranon bursitis of right elbow 02/03/2017   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   Subclinical hypothyroidism 05/06/2016   Muscle cramps 04/29/2016   Malignant neoplasm of larynx ([place]) 01/09/2016   Dysphonia 03/21/2015   Displaced comminuted fracture  of shaft of right fibula, sequela 03/07/2015   Anxiety disorder 01/16/2014   Obesity 10/19/2012   Dry eye syndrome 06/10/2009   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   Vitamin D deficiency 05/28/2009   Sleep apnea 05/07/2008   History of hepatitis C 01/02/2003   Dyspnea 01/02/2003    Past Surgical History:  Procedure Laterality Date   COLONOSCOPY W/ POLYPECTOMY  2016   THROAT SURGERY  2018   cancer     OB History   No obstetric history on file.     Family History  Problem Relation Age of Onset   Hypertension Mother    Colon cancer Maternal Aunt    Diabetes Maternal Grandmother     Social History   Tobacco Use   Smoking status: Every Day    Packs/day: 0.50    Years: 45.00    Pack years: 22.50    Types: Cigarettes   Smokeless tobacco: Never   Tobacco comments:    currently smoking 0.5 ppd, but she has cut down from over 1 ppd  Vaping Use   Vaping Use: Never used  Substance Use Topics   Alcohol use: No   Drug use: No    Home Medications Prior to Admission medications   Medication Sig Start Date End Date Taking? Authorizing Provider  levofloxacin (LEVAQUIN) 500 MG tablet Take 1 tablet (500 mg total) by mouth daily. 08/06/21  Yes Sherwood Gambler, MD  amLODipine (NORVASC) 10 MG tablet Take 1 tablet (10 mg total) by mouth daily. Due for follow up visit 08/05/21   Sherwood Gambler, MD  amoxicillin (AMOXIL) 500 MG capsule Take 2 capsules (1,000 mg total) by mouth 2 (two) times daily. 10/01/20   Orpah Greek, MD  aspirin EC 81 MG tablet Take 1 tablet by mouth daily. 01/07/17   [provider]  atorvastatin (LIPITOR) 20 MG tablet Take 1 tablet by mouth daily. 01/07/17   [provider]  Cholecalciferol (VITAMIN D-1000 MAX ST) 1000 units tablet Take 1 tablet by mouth daily. 01/07/17   [provider]  diazepam (VALIUM) 2 MG tablet Take 1 tablet (2 mg total) by mouth every 6 (six) hours as needed for muscle spasms. 01/14/21   Lacretia Leigh, MD   fluticasone [facility]) 50 MCG/ACT nasal spray Place into the nose. 01/08/17   [provider]  ipratropium (ATROVENT) 0.06 % nasal spray Place 2 sprays into both nostrils 4 (four) times daily as needed for rhinitis. 09/11/20   Augusto Gamble B, NP  lidocaine (LIDODERM) 5 % Place 1 patch onto the skin daily. Remove & Discard patch within 12 hours or as directed by MD 01/14/21   Lacretia Leigh, MD  loratadine (CLARITIN) 10 MG tablet Take by mouth. 01/08/17   [provider]  Multiple Vitamin (MULTI-VITAMIN) tablet Take by mouth. 05/08/16   [provider]  omeprazole (PRILOSEC) 20 MG capsule Take by mouth. 01/07/17   [provider]  oxyCODONE-acetaminophen (PERCOCET/ROXICET) 5-325 MG tablet Take 1-2 tablets by mouth every 4 (four) hours as needed for severe pain. 01/14/21   Lacretia Leigh, MD  predniSONE (DELTASONE) 20 MG tablet Take 2 tablets (40 mg total) by mouth daily with breakfast. 10/01/20   Pollina, Gwenyth Allegra, MD  umeclidinium bromide (INCRUSE ELLIPTA) 62.5 MCG/INH AEPB Inhale 1 puff into the lungs daily. 11/29/17   Trixie Dredge, PA-C  varenicline (CHANTIX PAK) 0.5 MG X 11 & 1 MG X 42 tablet Take one 0.5mg  tab once daily for first 3 days, then 0.5mg  tab twice daily for 4 days, then 1mg  tab twice daily. 01/07/17   [provider]    Allergies    Epinephrine and Theraflu severe cold daytime [dm-phenylephrine-acetaminophen]  Review of Systems   Review of Systems  Constitutional:  Positive for fever.  Respiratory:  Positive for cough and shortness of breath.   Gastrointestinal:  Positive for diarrhea. Negative for vomiting.  All other systems reviewed and are negative.  Physical Exam Updated Vital Signs BP (!) 160/90 (BP Location: Left Arm)   Pulse 72   Temp 98.2 F (36.8 C) (Oral)   Resp 18   SpO2 95%   Physical Exam Vitals and nursing note reviewed.  Constitutional:      General: She is not in acute distress.    Appearance:  She is well-developed. She is not ill-appearing or diaphoretic.  HENT:     Head: Normocephalic and atraumatic.     Right Ear: External ear normal.     Left Ear: External ear normal.     Nose: Nose normal.  Eyes:     General:        Right eye: No discharge.        Left eye: No discharge.  Cardiovascular:     Rate and Rhythm: Normal rate and regular rhythm.     Heart sounds: Normal heart sounds.  Pulmonary:     Effort: Pulmonary effort is normal.  Breath sounds: Normal breath sounds. No wheezing.  Abdominal:     Palpations: Abdomen is soft.     Tenderness: There is no abdominal tenderness.  Skin:    General: Skin is warm and dry.  Neurological:     Mental Status: She is alert.  Psychiatric:        Mood and Affect: Mood is not anxious.    ED Results / Procedures / Treatments   Labs (all labs ordered are listed, but only abnormal results are displayed) Labs Reviewed  COMPREHENSIVE METABOLIC PANEL - Abnormal; Notable for the following components:      Result Value   Sodium 128 (*)    Potassium 3.2 (*)    Chloride 95 (*)    CO2 21 (*)    Glucose, Bld 133 (*)    Creatinine, Ser 1.17 (*)    Albumin 3.4 (*)    AST 65 (*)    ALT 135 (*)    GFR, Estimated 52 (*)    All other components within normal limits  CBC - Abnormal; Notable for the following components:   WBC 10.9 (*)    All other components within normal limits  RESP PANEL BY RT-PCR (FLU A&B, COVID) ARPGX2  URINALYSIS, ROUTINE W REFLEX MICROSCOPIC    EKG None  Radiology DG Chest 2 View  Result Date: 08/05/2021 CLINICAL DATA:  Productive cough for 1 week. EXAM: CHEST - 2 VIEW COMPARISON:  September 30, 2020 FINDINGS: Mild to moderate severity infiltrate is seen within the right lung base. There is no evidence of a pleural effusion or pneumothorax. The heart size and mediastinal contours are within normal limits. The visualized skeletal structures are unremarkable. IMPRESSION: Mild to moderate severity right  basilar infiltrate. Electronically Signed   By: Virgina Norfolk M.D.   On: 08/05/2021 00:54    Procedures Procedures   Medications Ordered in ED Medications  albuterol (VENTOLIN HFA) 108 (90 Base) MCG/ACT inhaler 2 puff (2 puffs Inhalation Patient Refused/Not Given 08/05/21 0141)  aerochamber Z-Stat Plus/medium 1 each (1 each Other Patient Refused/Not Given 08/05/21 0140)  levofloxacin (LEVAQUIN) tablet 500 mg (has no administration in time range)  amLODipine (NORVASC) tablet 10 mg (has no administration in time range)    ED Course  I have reviewed the triage vital signs and the nursing notes.  Pertinent labs & imaging results that were available during my care of the patient were reviewed by me and considered in my medical decision making (see chart for details).    MDM Rules/Calculators/A&P                           Patient is overall well-appearing.  She does have some hypertension.  O2 sats are okay, including when walking to the room with a pulse oximeter.  She does have right lower lobe pneumonia on chest x-ray and she still seems symptomatic despite the fact that she seems to be improving recently.  I think would be reasonable to treat with antibiotics.  She has some mild hyponatremia.  She otherwise will be treated with antibiotics.  She is asking for something relatively cheaper due to cost issues.  We will represcribe the Norvasc but not the HCTZ given the mild hyponatremia.  At this point, she does not really need admission but will be given return precautions. Will also order potassium replacement for mild hypokalemia. Final Clinical Impression(s) / ED Diagnoses Final diagnoses:  Community acquired pneumonia of right lower lobe  of lung    Rx / DC Orders ED Discharge Orders          Ordered    levofloxacin (LEVAQUIN) 500 MG tablet  Daily        08/05/21 1400    amLODipine (NORVASC) 10 MG tablet  Daily        08/05/21 1400             Sherwood Gambler,  MD 08/05/21 1415

## 2021-08-05 NOTE — Discharge Instructions (Addendum)
If you develop trouble breathing, cough with blood, chest pain, or any other new/concerning symptoms then return to the ER for evaluation.

## 2021-08-05 NOTE — ED Notes (Signed)
Pt ambulatory from lobby to Tr-9. Pulse ox remained 98-99% RA while ambulatory.

## 2021-08-05 NOTE — ED Triage Notes (Signed)
Patient states that she worked on 10/21 and after that began having chills. Patient states that she had a high fever at home. Patient took tylenol at home. Patient states generalized body aches, cough with white sputum. Patient states hx of throat cancer. Patient denies N/V, endorses diarrhea.

## 2021-08-05 NOTE — ED Notes (Signed)
Pt verbalized understanding of discharge instructions. Pt is having a friend pick her up and wishes to wait outside. Pt refused inhaler.

## 2021-08-05 NOTE — ED Provider Notes (Signed)
Emergency Medicine Provider Triage Evaluation Note  Zoe Snyder , a 65 y.o. female  was evaluated in triage.  Pt complains of URI symptoms, fevers, loss of appetite starting 10/21.  Pt reports cough, congestion.  She reports laying in bed since that time due to feeling so unwell.  She reports she feels slightly better today.  Denies chest pain, N/V.  Pt reports tylenol has improved the fevers but they come back.    Review of Systems  Positive: Fever, chills, cough, body aches Negative: N/V  Physical Exam  BP (!) 151/97   Pulse 74   Temp 98 F (36.7 C) (Oral)   Resp 17   SpO2 99%   Gen:   Awake, no distress   Resp:  Normal effort, focal rhonchi in the RLL  MSK:   Moves extremities without difficulty  Other:    Medical Decision Making  Medically screening exam initiated at 1:12 AM.  Appropriate orders placed.  Brown Human Welle was informed that the remainder of the evaluation will be completed by another provider, this initial triage assessment does not replace that evaluation, and the importance of remaining in the ED until their evaluation is complete.  Uri vs COVID.  Concern for PNA.     Hazim Treadway, Gwenlyn Perking 03/49/17 9150    Delora Fuel, MD 56/97/94 [phone]

## 2022-01-22 ENCOUNTER — Other Ambulatory Visit: Payer: Self-pay | Admitting: Family Medicine

## 2022-01-22 DIAGNOSIS — E0590 Thyrotoxicosis, unspecified without thyrotoxic crisis or storm: Secondary | ICD-10-CM

## 2022-01-22 DIAGNOSIS — F17219 Nicotine dependence, cigarettes, with unspecified nicotine-induced disorders: Secondary | ICD-10-CM

## 2022-01-28 ENCOUNTER — Other Ambulatory Visit: Payer: Self-pay | Admitting: Family Medicine

## 2022-01-28 DIAGNOSIS — E039 Hypothyroidism, unspecified: Secondary | ICD-10-CM

## 2022-01-29 ENCOUNTER — Other Ambulatory Visit: Payer: Self-pay

## 2022-02-09 ENCOUNTER — Inpatient Hospital Stay: Admission: RE | Admit: 2022-02-09 | Payer: Self-pay | Source: Ambulatory Visit

## 2022-03-04 ENCOUNTER — Ambulatory Visit
Admission: RE | Admit: 2022-03-04 | Discharge: 2022-03-04 | Disposition: A | Discharge status: Home / Self Care | Payer: Self-pay | Source: Ambulatory Visit | Attending: Family Medicine | Admitting: Family Medicine

## 2022-03-04 DIAGNOSIS — F17219 Nicotine dependence, cigarettes, with unspecified nicotine-induced disorders: Secondary | ICD-10-CM

## 2022-05-22 ENCOUNTER — Encounter (HOSPITAL_COMMUNITY): Payer: Self-pay | Admitting: Emergency Medicine

## 2022-05-22 ENCOUNTER — Ambulatory Visit (HOSPITAL_COMMUNITY)
Admission: EM | Admit: 2022-05-22 | Discharge: 2022-05-22 | Disposition: A | Discharge status: Home / Self Care | Payer: Medicare Other

## 2022-05-22 ENCOUNTER — Ambulatory Visit (INDEPENDENT_AMBULATORY_CARE_PROVIDER_SITE_OTHER): Payer: Medicare Other

## 2022-05-22 ENCOUNTER — Other Ambulatory Visit: Payer: Self-pay

## 2022-05-22 DIAGNOSIS — M5136 Other intervertebral disc degeneration, lumbar region: Secondary | ICD-10-CM

## 2022-05-22 DIAGNOSIS — G8929 Other chronic pain: Secondary | ICD-10-CM | POA: Diagnosis not present

## 2022-05-22 DIAGNOSIS — M545 Low back pain, unspecified: Secondary | ICD-10-CM | POA: Diagnosis not present

## 2022-05-22 LAB — POCT URINALYSIS DIPSTICK, ED / UC
Bilirubin Urine: NEGATIVE
Glucose, UA: NEGATIVE mg/dL
Ketones, ur: NEGATIVE mg/dL
Leukocytes,Ua: NEGATIVE
Nitrite: NEGATIVE
Protein, ur: NEGATIVE mg/dL
Specific Gravity, Urine: 1.025 (ref 1.005–1.030)
Urobilinogen, UA: 0.2 mg/dL (ref 0.0–1.0)
pH: 5.5 (ref 5.0–8.0)

## 2022-05-22 MED ORDER — MELOXICAM 7.5 MG PO TABS: 7.5000 mg | ORAL_TABLET | Freq: Every day | ORAL | 0 refills | Status: DC

## 2022-05-22 MED ORDER — BACLOFEN 5 MG PO TABS: 5.0000 mg | ORAL_TABLET | Freq: Two times a day (BID) | ORAL | 0 refills | Status: DC | PRN

## 2022-05-22 NOTE — Discharge Instructions (Signed)
Your urine showed blood but this has been present for over a year.  Your x-ray did show some evidence of arthritis which is likely contributing to your pain.  I think it is important that you follow-up with an orthopedic provider.  Please call to schedule an appointment.  I have called in 2 medications to help with your pain.  Please take meloxicam daily.  Do not take NSAIDs including aspirin, ibuprofen/Advil, naproxen/Aleve with this medication.  You can use Tylenol for breakthrough pain.  I have also called in a muscle relaxer.  This will make you sleepy so do not drive or drink alcohol with taking it.  Use heat and gentle stretch for symptom relief.  If anything worsens you should be seen immediately.

## 2022-05-22 NOTE — ED Triage Notes (Addendum)
Complains of lower back pain that started several weeks ago.  Denies any known injury.  Reports a history of "sciatica".  When discussing falls.  Admitted to falling on a slope ( small hill) and has a bruise on right knee per patient.  "But that's not what's wrong with my back"

## 2022-05-22 NOTE — ED Provider Notes (Signed)
[facility]    CSN: [id number] Arrival date & time: 05/22/22  1700      History   Chief Complaint Chief Complaint  Patient presents with   Back Pain    HPI Zoe Snyder is a 66 y.o. female.   Patient presents today with a several month history of intermittent lower back pain that has worsened and become more persistent and painful over the past several weeks.  She denies any known injury or increase in activity prior to symptom onset.  She does have a history of intermittent lower back pain with sciatica.  She denies any recent falls or injuries.  She does have a physically demanding job that requires her to be on her feet for 12 hours and exacerbates pain.  Denies any significant lifting.  She has tried Tylenol and ibuprofen without improvement of symptoms.  She denies any bowel/bladder incontinence, lower extremity weakness, saddle anesthesia.  She denies any urinary symptoms.  Denies previous spinal surgery.  Denies any bowel/bladder incontinence, lower extremity weakness, saddle anesthesia.  She has missed work as result of symptoms prompting evaluation today.  She does have a history of malignancy.    Past Medical History:  Diagnosis Date   COPD (chronic obstructive pulmonary disease) ([place])    Hashimoto's thyroiditis    History of alcohol abuse 11/30/2017   History of cocaine abuse ([place]) 11/30/2017   History of colon polyps    Hyperlipidemia    Hypertension    Prediabetes    Throat cancer ([place])    Tobacco use disorder     Patient Active Problem List   Diagnosis Date Noted   Chronic obstructive pulmonary disease ([place]) 12/08/2017   Heavy tobacco smoker >10 cigarettes per day 12/08/2017   Unintended weight loss 12/08/2017   Mixed hyperlipidemia 12/08/2017   Hypertension goal BP (blood pressure) < 130/80 12/08/2017   Prediabetes 12/08/2017   Throat cancer ([place]) 12/08/2017   History of cocaine abuse ([place]) 11/30/2017   History of alcohol abuse 11/30/2017    Refused influenza vaccine 11/29/2017   History of colon polyps    Olecranon bursitis of right elbow 02/03/2017   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   Subclinical hypothyroidism 05/06/2016   Muscle cramps 04/29/2016   Malignant neoplasm of larynx ([place]) 01/09/2016   Dysphonia 03/21/2015   Displaced comminuted fracture of shaft of right fibula, sequela 03/07/2015   Anxiety disorder 01/16/2014   Obesity 10/19/2012   Dry eye syndrome 06/10/2009   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      Home Medications    Prior to Admission medications   Medication Sig Start Date End Date Taking? Authorizing Provider  Baclofen 5 MG TABS Take 5 mg by mouth 2 (two) times daily as needed. 05/22/22  Yes Aliyha Fornes K, PA-C  Levothyroxine Sodium 50 MCG CAPS Take by mouth. 02/11/17  Yes [provider]  meloxicam (MOBIC) 7.5 MG tablet Take 1 tablet (7.5 mg total) by mouth daily. 05/22/22  Yes Daren Yeagle K, PA-C  amLODipine (NORVASC) 10 MG tablet Take 1 tablet (10 mg total) by mouth daily. Due for follow up visit 08/05/21   Sherwood Gambler, MD  aspirin EC 81 MG tablet Take 1 tablet by mouth daily. 01/07/17   [provider]  atorvastatin (LIPITOR) 20 MG tablet Take 1 tablet by mouth daily. 01/07/17   [provider]  Cholecalciferol (VITAMIN D-1000 MAX ST) 1000 units tablet Take 1 tablet by mouth daily. 01/07/17   [provider]  fluticasone (FLONASE) 50 MCG/ACT nasal spray Place into the nose. 01/08/17   [provider]  ipratropium (ATROVENT) 0.06 % nasal spray Place 2 sprays into both nostrils 4 (four) times daily as needed for rhinitis. 09/11/20   Augusto Gamble B, NP  lidocaine (LIDODERM) 5  % Place 1 patch onto the skin daily. Remove & Discard patch within 12 hours or as directed by MD 01/14/21   Lacretia Leigh, MD  loratadine (CLARITIN) 10 MG tablet Take by mouth. Patient not taking: Reported on 05/22/2022 01/08/17   [provider]  Multiple Vitamin (MULTI-VITAMIN) tablet Take by mouth. 05/08/16   [provider]  omeprazole (PRILOSEC) 20 MG capsule Take by mouth. 01/07/17   [provider]  umeclidinium bromide (INCRUSE ELLIPTA) 62.5 MCG/INH AEPB Inhale 1 puff into the lungs daily. Patient not taking: Reported on 05/22/2022 11/29/17   Trixie Dredge, PA-C  varenicline (CHANTIX PAK) 0.5 MG X 11 & 1 MG X 42 tablet Take one 0.'5mg'$  tab once daily for first 3 days, then 0.'5mg'$  tab twice daily for 4 days, then '1mg'$  tab twice daily. 01/07/17   [provider]    Family History Family History  Problem Relation Age of Onset   Hypertension Mother    Diabetes Maternal Grandmother    Colon cancer Maternal Aunt     Social History Social History   Tobacco Use   Smoking status: Every Day    Packs/day: 0.50    Years: 45.00    Total pack years: 22.50    Types: Cigarettes   Smokeless tobacco: Never   Tobacco comments:    currently smoking 0.5 ppd, but she has cut down from over 1 ppd  Vaping Use   Vaping Use: Never used  Substance Use Topics   Alcohol use: Yes   Drug use: Yes    Types: Marijuana     Allergies   Epinephrine and Theraflu severe cold daytime [dm-phenylephrine-acetaminophen]   Review of Systems Review of Systems  Constitutional:  Positive for activity change. Negative for appetite change, fatigue and fever.  Respiratory:  Negative for cough and shortness of breath.   Cardiovascular:  Negative for chest pain.  Gastrointestinal:  Negative for abdominal pain, diarrhea, nausea and vomiting.  Genitourinary:  Negative for dysuria, frequency and urgency.  Musculoskeletal:  Positive for back pain. Negative for arthralgias and  myalgias.  Neurological:  Negative for weakness and numbness.     Physical Exam Triage Vital Signs ED Triage Vitals  Enc Vitals Group     BP 05/22/22 1806 (!) 159/92     Pulse Rate 05/22/22 1806 81     Resp 05/22/22 1806 (!) 22     Temp 05/22/22 1806 98.8 F (37.1 C)     Temp Source 05/22/22 1806 Oral     SpO2 05/22/22 1806 97 %     Weight --      Height --      Head Circumference --      Peak Flow --      Pain Score 05/22/22 1759 9     Pain Loc --      Pain Edu? --  Excl. in GC? --    No data found.  Updated Vital Signs BP (!) 159/92 (BP Location: Right Arm) Comment (BP Location): large cuff  Pulse 81   Temp 98.8 F (37.1 C) (Oral)   Resp (!) 22   SpO2 97%   Visual Acuity Right Eye Distance:   Left Eye Distance:   Bilateral Distance:    Right Eye Near:   Left Eye Near:    Bilateral Near:     Physical Exam Vitals reviewed.  Constitutional:      General: She is awake. She is not in acute distress.    Appearance: Normal appearance. She is well-developed. She is not ill-appearing.     Comments: Very pleasant female appears stated age in no acute distress sitting comfortably in exam room  HENT:     Head: Normocephalic and atraumatic.     Mouth/Throat:     Dentition: Abnormal dentition.     Pharynx: Uvula midline. No oropharyngeal exudate or posterior oropharyngeal erythema.  Cardiovascular:     Rate and Rhythm: Normal rate and regular rhythm.     Heart sounds: Normal heart sounds, S1 normal and S2 normal. No murmur heard. Pulmonary:     Effort: Pulmonary effort is normal.     Breath sounds: Normal breath sounds. No wheezing, rhonchi or rales.     Comments: Clear to auscultation bilaterally Abdominal:     General: Bowel sounds are normal.     Palpations: Abdomen is soft.     Tenderness: There is no abdominal tenderness. There is no right CVA tenderness, left CVA tenderness, guarding or rebound.  Musculoskeletal:     Cervical back: No tenderness or  bony tenderness.     Thoracic back: No tenderness or bony tenderness.     Lumbar back: Tenderness and bony tenderness present. Negative right straight leg raise test and negative left straight leg raise test.     Comments: Pain percussion of lumbar vertebrae.  Mild tenderness palpation of bilateral lumbar paraspinal muscles.  No deformity or step-off noted.  Strength 5/5 bilateral lower extremities.  Psychiatric:        Behavior: Behavior is cooperative.      UC Treatments / Results  Labs (all labs ordered are listed, but only abnormal results are displayed) Labs Reviewed  POCT URINALYSIS DIPSTICK, ED / UC - Abnormal; Notable for the following components:      Result Value   Hgb urine dipstick MODERATE (*)    All other components within normal limits    EKG   Radiology DG Lumbar Spine Complete  Result Date: 05/22/2022 CLINICAL DATA:  Back pain EXAM: LUMBAR SPINE - COMPLETE 4+ VIEW COMPARISON:  None Available. FINDINGS: No recent fracture is seen. Alignment of the posterior margins of vertebral bodies is unremarkable. Degenerative changes are noted, particularly severe at L5-S1 level with disc space narrowing, bony spurs and facet hypertrophy. There are scattered arterial calcifications. IMPRESSION: No recent fracture is seen. Degenerative changes are noted, particularly severe at L5-S1 level. Electronically Signed   By: Elmer Picker M.D.   On: 05/22/2022 19:05    Procedures Procedures (including critical care time)  Medications Ordered in UC Medications - No data to display  Initial Impression / Assessment and Plan / UC Course  I have reviewed the triage vital signs and the nursing notes.  Pertinent labs & imaging results that were available during my care of the patient were reviewed by me and considered in my medical decision making (see chart for  details).     Patient was well-appearing, afebrile, nontoxic, nontachycardic.  UA was obtained that showed moderate  hemoglobin but this has been present for over a year and is unchanged from baseline.  No evidence of infection on UA.  X-ray obtained given bony tenderness and history of malignancy showed degenerative changes without acute abnormalities.  Recommended conservative treatment measures including heat, rest, stretch.  Did encourage her to follow-up with orthopedics and she was given contact information for local provider with instruction to call to schedule an appointment.  She was started on baclofen up to twice a day.  Discussed that this is sedating and she should not drive or drink alcohol with taking it.  We will start Mobic for additional pain relief.  She was instructed to take NSAIDs with this medication due to risk of GI bleeding.  Can use Tylenol for breakthrough pain.  Recommended she avoid strenuous activities including heavy lifting.  She was provided a work excuse note.  Discussed that if she has any worsening symptoms including pain, fever, bowel/bladder continence, lower extremity weakness she needs to go to the emergency room immediately to which she expressed understanding.  Final Clinical Impressions(s) / UC Diagnoses   Final diagnoses:  Degenerative disc disease, lumbar  Chronic bilateral low back pain without sciatica     Discharge Instructions      Your urine showed blood but this has been present for over a year.  Your x-ray did show some evidence of arthritis which is likely contributing to your pain.  I think it is important that you follow-up with an orthopedic provider.  Please call to schedule an appointment.  I have called in 2 medications to help with your pain.  Please take meloxicam daily.  Do not take NSAIDs including aspirin, ibuprofen/Advil, naproxen/Aleve with this medication.  You can use Tylenol for breakthrough pain.  I have also called in a muscle relaxer.  This will make you sleepy so do not drive or drink alcohol with taking it.  Use heat and gentle stretch for  symptom relief.  If anything worsens you should be seen immediately.     ED Prescriptions     Medication Sig Dispense Auth. Provider   Baclofen 5 MG TABS Take 5 mg by mouth 2 (two) times daily as needed. 20 tablet Bobby Ragan K, PA-C   meloxicam (MOBIC) 7.5 MG tablet Take 1 tablet (7.5 mg total) by mouth daily. 10 tablet Simra Fiebig K, PA-C      I have reviewed the PDMP during this encounter.   Terrilee Croak, PA-C 05/22/22 1940

## 2022-05-27 ENCOUNTER — Other Ambulatory Visit: Payer: Medicare Other

## 2022-06-04 ENCOUNTER — Ambulatory Visit
Admission: RE | Admit: 2022-06-04 | Discharge: 2022-06-04 | Disposition: A | Discharge status: Home / Self Care | Payer: Medicare Other | Source: Ambulatory Visit | Attending: Family Medicine | Admitting: Family Medicine

## 2022-06-04 DIAGNOSIS — E039 Hypothyroidism, unspecified: Secondary | ICD-10-CM

## 2022-06-26 ENCOUNTER — Telehealth: Payer: Self-pay

## 2022-06-26 ENCOUNTER — Other Ambulatory Visit (HOSPITAL_COMMUNITY): Payer: Self-pay

## 2022-06-26 NOTE — Progress Notes (Unsigned)
Subjective:    Patient ID: Zoe Snyder, female    DOB: [birth year], 66 y.o.   MRN: [id number]  No chief complaint on file.   HPI:  Zoe Snyder is a 66 y.o. female with previous medical history of malignant neoplasm of the larynx, anxiety, sleep apnea, history of cocaine and alcohol use, tobacco use, and hypertension presenting today for initial office visit for Hepatitis C.   Zoe Snyder previous lab work from January 2014 shows a Hepatitis C RNA level of 6.8 million. She has had subsequent RNA levels of 3.04 million (June 2016), Undetectable (October 2016), Undetectable (November 2016), and undetectable (March 2017). She was treated previously with Harvoni.    Allergies  Allergen Reactions   Epinephrine Anaphylaxis    Respiratory problems, e.g., wheezing; Palpitations  Respiratory problems, e.g., wheezing; Palpitations    Theraflu Severe Cold Daytime [Dm-Phenylephrine-Acetaminophen] Palpitations      Outpatient Medications Prior to Visit  Medication Sig Dispense Refill   amLODipine (NORVASC) 10 MG tablet Take 1 tablet (10 mg total) by mouth daily. Due for follow up visit 30 tablet 1   aspirin EC 81 MG tablet Take 1 tablet by mouth daily.     atorvastatin (LIPITOR) 20 MG tablet Take 1 tablet by mouth daily.     Baclofen 5 MG TABS Take 5 mg by mouth 2 (two) times daily as needed. 20 tablet 0   Cholecalciferol (VITAMIN D-1000 MAX ST) 1000 units tablet Take 1 tablet by mouth daily.     fluticasone (FLONASE) 50 MCG/ACT nasal spray Place into the nose.     ipratropium (ATROVENT) 0.06 % nasal spray Place 2 sprays into both nostrils 4 (four) times daily as needed for rhinitis. 15 mL 0   Levothyroxine Sodium 50 MCG CAPS Take by mouth.     lidocaine (LIDODERM) 5 % Place 1 patch onto the skin daily. Remove & Discard patch within 12 hours or as directed by MD 30 patch 0   loratadine (CLARITIN) 10 MG tablet Take by mouth. (Patient not taking: Reported on 05/22/2022)      meloxicam (MOBIC) 7.5 MG tablet Take 1 tablet (7.5 mg total) by mouth daily. 10 tablet 0   Multiple Vitamin (MULTI-VITAMIN) tablet Take by mouth.     omeprazole (PRILOSEC) 20 MG capsule Take by mouth.     umeclidinium bromide (INCRUSE ELLIPTA) 62.5 MCG/INH AEPB Inhale 1 puff into the lungs daily. (Patient not taking: Reported on 05/22/2022) 30 each 2   varenicline (CHANTIX PAK) 0.5 MG X 11 & 1 MG X 42 tablet Take one 0.'5mg'$  tab once daily for first 3 days, then 0.'5mg'$  tab twice daily for 4 days, then '1mg'$  tab twice daily.     No facility-administered medications prior to visit.     Past Medical History:  Diagnosis Date   COPD (chronic obstructive pulmonary disease) ([place])    Hashimoto's thyroiditis    History of alcohol abuse 11/30/2017   History of cocaine abuse ([place]) 11/30/2017   History of colon polyps    Hyperlipidemia    Hypertension    Prediabetes    Throat cancer ([place])    Tobacco use disorder      Past Surgical History:  Procedure Laterality Date   COLONOSCOPY W/ POLYPECTOMY  2016   THROAT SURGERY  2018   cancer       Review of Systems  Constitutional:  Negative for chills, fatigue, fever and unexpected weight change.  Respiratory:  Negative for cough, chest tightness, shortness of  breath and wheezing.   Cardiovascular:  Negative for chest pain and leg swelling.  Gastrointestinal:  Negative for abdominal distention, constipation, diarrhea, nausea and vomiting.  Neurological:  Negative for dizziness, weakness, light-headedness and headaches.  Hematological:  Does not bruise/bleed easily.      Objective:    There were no vitals taken for this visit. Nursing note and vital signs reviewed.  Physical Exam Constitutional:      General: She is not in acute distress.    Appearance: She is well-developed.  Cardiovascular:     Rate and Rhythm: Normal rate and regular rhythm.     Heart sounds: Normal heart sounds. No murmur heard.    No friction rub. No gallop.   Pulmonary:     Effort: Pulmonary effort is normal. No respiratory distress.     Breath sounds: Normal breath sounds. No wheezing or rales.  Chest:     Chest wall: No tenderness.  Abdominal:     General: Bowel sounds are normal. There is no distension.     Palpations: Abdomen is soft. There is no mass.     Tenderness: There is no abdominal tenderness. There is no guarding or rebound.  Skin:    General: Skin is warm and dry.  Neurological:     Mental Status: She is alert and oriented to person, place, and time.  Psychiatric:        Behavior: Behavior normal.        Thought Content: Thought content normal.        Judgment: Judgment normal.          No data to display             Assessment & Plan:    Patient Active Problem List   Diagnosis Date Noted   Chronic obstructive pulmonary disease ([place]) 12/08/2017   Heavy tobacco smoker >10 cigarettes per day 12/08/2017   Unintended weight loss 12/08/2017   Mixed hyperlipidemia 12/08/2017   Hypertension goal BP (blood pressure) < 130/80 12/08/2017   Prediabetes 12/08/2017   Throat cancer ([place]) 12/08/2017   History of cocaine abuse ([place]) 11/30/2017   History of alcohol abuse 11/30/2017   Refused influenza vaccine 11/29/2017   History of colon polyps    Olecranon bursitis of right elbow 02/03/2017   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   Subclinical hypothyroidism 05/06/2016   Muscle cramps 04/29/2016   Malignant neoplasm of larynx ([place]) 01/09/2016   Dysphonia 03/21/2015   Displaced comminuted fracture of shaft of right fibula, sequela 03/07/2015   Anxiety disorder 01/16/2014   Obesity 10/19/2012   Dry eye syndrome 06/10/2009   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   Vitamin D deficiency 05/28/2009   Sleep apnea 05/07/2008   History of hepatitis C 01/02/2003   Dyspnea 01/02/2003     Problem List Items Addressed This Visit   None    I am having Zoe Snyder maintain her aspirin EC, atorvastatin, Cholecalciferol,  fluticasone, loratadine, Multi-Vitamin, omeprazole, varenicline, umeclidinium bromide, ipratropium, lidocaine, amLODipine, Levothyroxine Sodium, Baclofen, and meloxicam.   No orders of the defined types were placed in this encounter.    Follow-up: No follow-ups on file.   Terri Piedra, MSN, FNP-C Nurse Practitioner [facility] [place] number: [phone]

## 2022-06-26 NOTE — Telephone Encounter (Signed)
[facility] Patient Teacher, English as a foreign language completed.    The patient is insured through [id number] .  Medication will need a PA.  We will continue to follow to see if copay assistance is needed.  Ileene Patrick, [facility] Patient [facility] Phone: [phone] Fax:  [phone]

## 2022-06-29 ENCOUNTER — Other Ambulatory Visit: Payer: Self-pay

## 2022-06-29 ENCOUNTER — Encounter: Payer: Self-pay | Admitting: Family

## 2022-06-29 ENCOUNTER — Ambulatory Visit (INDEPENDENT_AMBULATORY_CARE_PROVIDER_SITE_OTHER): Payer: Medicare Other | Admitting: Family

## 2022-06-29 VITALS — BP 181/82 | HR 80 | Temp 98.3°F | Resp 16 | Ht 64.0 in | Wt 182.2 lb

## 2022-06-29 DIAGNOSIS — Z8619 Personal history of other infectious and parasitic diseases: Secondary | ICD-10-CM

## 2022-06-29 NOTE — Assessment & Plan Note (Addendum)
Zoe Snyder is a 66 y/o caucasian female with previous history of chronic Hepatitis C s/p treatment with Harvoni and sustained viremic response in March 2017.  She is currently at low risk for Hepatitis C re-infection and would suspect her Hepatitis C RNA level will be undetectable. Check Hepatitis C RNA level. If undetectable no additional treatment is needed; if RNA level present will discuss next steps and treatment options. Educated if future screenings are needed to check Hepatitis C RNA level to determine if treatment is needed as Hepatitis C antibody will always remain positive.  Follow up with ID as needed pending lab work results.

## 2022-06-29 NOTE — Patient Instructions (Signed)
Nice to see you.  We will check your lab work today.  If you need to be tested for Hepatitis C in the future a Hepatitis C RNA level or viral load will need to be checked as the Hepatitis C antibody used for initial screening will always be positive.    Plan for follow up as needed.   Have a great day and stay safe!

## 2022-07-01 LAB — HEPATITIS C RNA QUANTITATIVE
HCV Quantitative Log: <1.18 log IU/mL
HCV RNA, PCR, QN: <15 IU/mL

## 2022-07-02 ENCOUNTER — Telehealth: Payer: Self-pay

## 2022-07-02 NOTE — Telephone Encounter (Signed)
-----   Message from [place], [place] sent at 07/02/2022  8:59 AM EDT ----- Please inform Zoe Snyder that her Hepatitis C RNA level is not detected and no treatment is needed as we discussed during her office visit.

## 2022-07-02 NOTE — Telephone Encounter (Signed)
Spoke with patient, relayed that her HCV viral load was undetectable and no need for treatment. Patient verbalized understanding and has no further questions.   Beryle Flock, RN

## 2022-08-13 ENCOUNTER — Encounter (HOSPITAL_COMMUNITY): Payer: Self-pay

## 2022-08-13 ENCOUNTER — Ambulatory Visit (HOSPITAL_COMMUNITY)
Admission: EM | Admit: 2022-08-13 | Discharge: 2022-08-13 | Disposition: A | Discharge status: Home / Self Care | Payer: Medicare Other | Attending: Internal Medicine | Admitting: Internal Medicine

## 2022-08-13 DIAGNOSIS — T50905A Adverse effect of unspecified drugs, medicaments and biological substances, initial encounter: Secondary | ICD-10-CM | POA: Diagnosis not present

## 2022-08-13 NOTE — ED Triage Notes (Signed)
Patient being seen due to new medication problems. Baclofen made her tired. She also had a slight fever.   Other muscle relaxer she was on had her sleeping at work and throwing up.   Has also ben having headaches.   Patient states the new meds said not to take tylenol but  she took it because of the slight fever.

## 2022-08-13 NOTE — Discharge Instructions (Signed)
Please take muscle relaxants before bedtime.  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  Please do not take more than once a day because of the drowsiness. Back strengthening exercise as discussed.. Follow-up with your specialist if you have any further concerns.

## 2022-08-13 NOTE — ED Provider Notes (Signed)
[facility]    CSN: [id number] Arrival date & time: 08/13/22  1734      History   Chief Complaint Chief Complaint  Patient presents with   Medication Reaction   Emesis    HPI Zoe Snyder is a 66 y.o. female comes to the urgent care for medication reaction.  Patient was recently prescribed cyclobenzaprine for back pain.  She has been taking the medication twice daily but she is concerned that the medication is making her drowsy.  She comes to the urgent care for medication advice.  No falls.   HPI  Past Medical History:  Diagnosis Date   COPD (chronic obstructive pulmonary disease) ([place])    Hashimoto's thyroiditis    History of alcohol abuse 11/30/2017   History of cocaine abuse ([place]) 11/30/2017   History of colon polyps    Hyperlipidemia    Hypertension    Prediabetes    Throat cancer ([place])    Tobacco use disorder     Patient Active Problem List   Diagnosis Date Noted   Chronic obstructive pulmonary disease ([place]) 12/08/2017   Heavy tobacco smoker >10 cigarettes per day 12/08/2017   Unintended weight loss 12/08/2017   Mixed hyperlipidemia 12/08/2017   Hypertension goal BP (blood pressure) < 130/80 12/08/2017   Prediabetes 12/08/2017   Throat cancer ([place]) 12/08/2017   History of cocaine abuse ([place]) 11/30/2017   History of alcohol abuse 11/30/2017   Refused influenza vaccine 11/29/2017   History of colon polyps    Olecranon bursitis of right elbow 02/03/2017   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   Subclinical hypothyroidism 05/06/2016   Muscle cramps 04/29/2016   Malignant neoplasm of larynx ([place]) 01/09/2016   Dysphonia 03/21/2015   Displaced comminuted fracture of shaft of right fibula, sequela 03/07/2015   Anxiety disorder 01/16/2014   Obesity 10/19/2012   Dry eye syndrome 06/10/2009   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   Vitamin D deficiency 05/28/2009   Sleep apnea 05/07/2008   History of hepatitis C 01/02/2003   Dyspnea 01/02/2003    Past Surgical  History:  Procedure Laterality Date   COLONOSCOPY W/ POLYPECTOMY  2016   THROAT SURGERY  2018   cancer    OB History   No obstetric history on file.      Home Medications    Prior to Admission medications   Medication Sig Start Date End Date Taking? Authorizing Provider  amLODipine (NORVASC) 10 MG tablet Take 1 tablet (10 mg total) by mouth daily. Due for follow up visit 08/05/21  Yes Sherwood Gambler, MD  aspirin EC 81 MG tablet Take 1 tablet by mouth daily. 01/07/17  Yes [provider]  atorvastatin (LIPITOR) 20 MG tablet Take 1 tablet by mouth daily. 01/07/17  Yes [provider]  Baclofen 5 MG TABS Take 5 mg by mouth 2 (two) times daily as needed. 05/22/22  Yes Raspet, Derry Skill, PA-C  Cholecalciferol (VITAMIN D-1000 MAX ST) 1000 units tablet Take 1 tablet by mouth daily. 01/07/17  Yes [provider]  fluticasone (FLONASE) 50 MCG/ACT nasal spray Place into the nose. 01/08/17  Yes [provider]  ipratropium (ATROVENT) 0.06 % nasal spray Place 2 sprays into both nostrils 4 (four) times daily as needed for rhinitis. 09/11/20  Yes Augusto Gamble B, NP  Levothyroxine Sodium 50 MCG CAPS Take by mouth. 02/11/17  Yes [provider]  meloxicam (MOBIC) 7.5 MG tablet Take 1 tablet (7.5 mg total) by mouth daily. 05/22/22  Yes Raspet, Derry Skill,  PA-C  Multiple Vitamin (MULTI-VITAMIN) tablet Take by mouth. 05/08/16  Yes [provider]  omeprazole (PRILOSEC) 20 MG capsule Take by mouth. 01/07/17  Yes [provider]    Family History Family History  Problem Relation Age of Onset   Hypertension Mother    Diabetes Maternal Grandmother    Colon cancer Maternal Aunt     Social History Social History   Tobacco Use   Smoking status: Every Day    Packs/day: 0.50    Years: 45.00    Total pack years: 22.50    Types: Cigarettes   Smokeless tobacco: Never   Tobacco comments:    currently smoking 0.5 ppd, but she has cut down from over 1  ppd  Vaping Use   Vaping Use: Never used  Substance Use Topics   Alcohol use: Yes   Drug use: Yes    Types: Marijuana     Allergies   Epinephrine and Theraflu severe cold daytime [dm-phenylephrine-acetaminophen]   Review of Systems Review of Systems  Cardiovascular: Negative.   Gastrointestinal: Negative.   Genitourinary: Negative.   Neurological:  Positive for dizziness and weakness. Negative for tremors, light-headedness and headaches.     Physical Exam Triage Vital Signs ED Triage Vitals  Enc Vitals Group     BP 08/13/22 1800 132/82     Pulse Rate 08/13/22 1800 94     Resp 08/13/22 1800 (!) 21     Temp 08/13/22 1800 98.6 F (37 C)     Temp Source 08/13/22 1800 Oral     SpO2 08/13/22 1800 91 %     Weight --      Height --      Head Circumference --      Peak Flow --      Pain Score 08/13/22 1758 0     Pain Loc --      Pain Edu? --      Excl. in [place]? --    No data found.  Updated Vital Signs BP 132/82 (BP Location: Right Arm)   Pulse 94   Temp 98.6 F (37 C) (Oral)   Resp (!) 21   SpO2 91%   Visual Acuity Right Eye Distance:   Left Eye Distance:   Bilateral Distance:    Right Eye Near:   Left Eye Near:    Bilateral Near:     Physical Exam Vitals and nursing note reviewed.  Constitutional:      General: She is not in acute distress.    Appearance: She is not ill-appearing.  Cardiovascular:     Rate and Rhythm: Normal rate and regular rhythm.     Pulses: Normal pulses.     Heart sounds: Normal heart sounds.  Pulmonary:     Effort: Pulmonary effort is normal.     Breath sounds: Normal breath sounds.  Musculoskeletal:        General: Normal range of motion.  Neurological:     Mental Status: She is alert.      UC Treatments / Results  Labs (all labs ordered are listed, but only abnormal results are displayed) Labs Reviewed - No data to display  EKG   Radiology No results found.  Procedures Procedures (including critical care  time)  Medications Ordered in UC Medications - No data to display  Initial Impression / Assessment and Plan / UC Course  I have reviewed the triage vital signs and the nursing notes.  Pertinent labs & imaging results that were available  during my care of the patient were reviewed by me and considered in my medical decision making (see chart for details).     1.  Medication reaction: Patient works third shift and she is advised to take the medication before she goes to bed. That will be in the morning since she works third shift. Return precautions given Medication precautions given as well. Final Clinical Impressions(s) / UC Diagnoses   Final diagnoses:  Medication reaction, initial encounter     Discharge Instructions      Please take muscle relaxants before bedtime.  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  Please do not take more than once a day because of the drowsiness. Back strengthening exercise as discussed.. Follow-up with your specialist if you have any further concerns.     ED Prescriptions   None    PDMP not reviewed this encounter.   Chase Picket, MD 08/13/22 [phone]

## 2022-09-28 ENCOUNTER — Other Ambulatory Visit: Payer: Self-pay

## 2022-09-28 ENCOUNTER — Emergency Department (HOSPITAL_COMMUNITY): Payer: Medicare Other

## 2022-09-28 ENCOUNTER — Encounter (HOSPITAL_COMMUNITY): Payer: Self-pay

## 2022-09-28 ENCOUNTER — Observation Stay (HOSPITAL_COMMUNITY)
Admission: EM | Admit: 2022-09-28 | Discharge: 2022-10-01 | Disposition: A | Discharge status: Home / Self Care | Payer: Medicare Other | Attending: Internal Medicine | Admitting: Internal Medicine

## 2022-09-28 DIAGNOSIS — Z79899 Other long term (current) drug therapy: Secondary | ICD-10-CM | POA: Insufficient documentation

## 2022-09-28 DIAGNOSIS — J441 Chronic obstructive pulmonary disease with (acute) exacerbation: Secondary | ICD-10-CM | POA: Diagnosis not present

## 2022-09-28 DIAGNOSIS — Z1152 Encounter for screening for COVID-19: Secondary | ICD-10-CM | POA: Insufficient documentation

## 2022-09-28 DIAGNOSIS — J101 Influenza due to other identified influenza virus with other respiratory manifestations: Secondary | ICD-10-CM | POA: Diagnosis not present

## 2022-09-28 DIAGNOSIS — Z8521 Personal history of malignant neoplasm of larynx: Secondary | ICD-10-CM | POA: Diagnosis not present

## 2022-09-28 DIAGNOSIS — J9601 Acute respiratory failure with hypoxia: Principal | ICD-10-CM | POA: Diagnosis present

## 2022-09-28 DIAGNOSIS — R7303 Prediabetes: Secondary | ICD-10-CM | POA: Diagnosis not present

## 2022-09-28 DIAGNOSIS — J449 Chronic obstructive pulmonary disease, unspecified: Secondary | ICD-10-CM | POA: Diagnosis present

## 2022-09-28 DIAGNOSIS — I10 Essential (primary) hypertension: Secondary | ICD-10-CM | POA: Diagnosis not present

## 2022-09-28 DIAGNOSIS — Z7952 Long term (current) use of systemic steroids: Secondary | ICD-10-CM | POA: Diagnosis not present

## 2022-09-28 DIAGNOSIS — F17210 Nicotine dependence, cigarettes, uncomplicated: Secondary | ICD-10-CM | POA: Insufficient documentation

## 2022-09-28 DIAGNOSIS — R0902 Hypoxemia: Secondary | ICD-10-CM

## 2022-09-28 DIAGNOSIS — Z7982 Long term (current) use of aspirin: Secondary | ICD-10-CM | POA: Diagnosis not present

## 2022-09-28 DIAGNOSIS — E039 Hypothyroidism, unspecified: Secondary | ICD-10-CM | POA: Insufficient documentation

## 2022-09-28 DIAGNOSIS — E038 Other specified hypothyroidism: Secondary | ICD-10-CM | POA: Diagnosis present

## 2022-09-28 DIAGNOSIS — R0602 Shortness of breath: Secondary | ICD-10-CM | POA: Diagnosis present

## 2022-09-28 DIAGNOSIS — E782 Mixed hyperlipidemia: Secondary | ICD-10-CM | POA: Insufficient documentation

## 2022-09-28 DIAGNOSIS — E669 Obesity, unspecified: Secondary | ICD-10-CM | POA: Diagnosis present

## 2022-09-28 LAB — CBC WITH DIFFERENTIAL/PLATELET
Abs Immature Granulocytes: 0.02 10*3/uL (ref 0.00–0.07)
Basophils Absolute: 0 10*3/uL (ref 0.0–0.1)
Basophils Relative: 0 %
Eosinophils Absolute: 0 10*3/uL (ref 0.0–0.5)
Eosinophils Relative: 0 %
HCT: 43.5 % (ref 36.0–46.0)
Hemoglobin: 14.4 g/dL (ref 12.0–15.0)
Immature Granulocytes: 0 %
Lymphocytes Relative: 7 %
Lymphs Abs: 0.5 10*3/uL — ABNORMAL LOW (ref 0.7–4.0)
MCH: 31 pg (ref 26.0–34.0)
MCHC: 33.1 g/dL (ref 30.0–36.0)
MCV: 93.5 fL (ref 80.0–100.0)
Monocytes Absolute: 0.6 10*3/uL (ref 0.1–1.0)
Monocytes Relative: 8 %
Neutro Abs: 5.7 10*3/uL (ref 1.7–7.7)
Neutrophils Relative %: 85 %
Platelets: 210 10*3/uL (ref 150–400)
RBC: 4.65 MIL/uL (ref 3.87–5.11)
RDW: 13.4 % (ref 11.5–15.5)
WBC: 6.9 10*3/uL (ref 4.0–10.5)
nRBC: 0 % (ref 0.0–0.2)

## 2022-09-28 LAB — COMPREHENSIVE METABOLIC PANEL
ALT: 23 U/L (ref 0–44)
AST: 32 U/L (ref 15–41)
Albumin: 3.7 g/dL (ref 3.5–5.0)
Alkaline Phosphatase: 76 U/L (ref 38–126)
Anion gap: 10 (ref 5–15)
BUN: 17 mg/dL (ref 8–23)
CO2: 25 mmol/L (ref 22–32)
Calcium: 9.1 mg/dL (ref 8.9–10.3)
Chloride: 101 mmol/L (ref 98–111)
Creatinine, Ser: 1.17 mg/dL — ABNORMAL HIGH (ref 0.44–1.00)
GFR, Estimated: 52 mL/min — ABNORMAL LOW (ref >60)
Glucose, Bld: 137 mg/dL — ABNORMAL HIGH (ref 70–99)
Potassium: 3.8 mmol/L (ref 3.5–5.1)
Sodium: 136 mmol/L (ref 135–145)
Total Bilirubin: 0.7 mg/dL (ref 0.3–1.2)
Total Protein: 7.1 g/dL (ref 6.5–8.1)

## 2022-09-28 LAB — RESP PANEL BY RT-PCR (RSV, FLU A&B, COVID)  RVPGX2
Influenza A by PCR: POSITIVE — AB
Influenza B by PCR: NEGATIVE
Resp Syncytial Virus by PCR: NEGATIVE
SARS Coronavirus 2 by RT PCR: NEGATIVE

## 2022-09-28 MED ORDER — ACETAMINOPHEN 500 MG PO TABS
1000.0000 mg | ORAL_TABLET | Freq: Once | ORAL | Status: AC
  Administered 2022-09-28: 1000 mg via ORAL
  Filled 2022-09-28: qty 2

## 2022-09-28 NOTE — ED Notes (Signed)
No answer x3

## 2022-09-28 NOTE — ED Provider Triage Note (Signed)
Emergency Medicine Provider Triage Evaluation Note  Zoe Snyder , a 66 y.o. female  was evaluated in triage.  Pt complains of shortness of breath and cough over the past 2 days.  She has had a fever of 101 degrees at home.  No vomiting or diarrhea.  She is concerned that she has pneumonia.  She does not use chronic oxygen.  Reports history of throat cancer and surgery.  Denies history of COPD, however it is listed in her chart.  Review of Systems  Positive: Cough, shortness of breath, fever Negative: Chest pain, vomiting  Physical Exam  There were no vitals taken for this visit. Gen:   Awake, no distress   Resp:  Normal effort, frequent coughing during exam, scattered expiratory wheezing MSK:   Moves extremities without difficulty  Other:  No lower extremity edema  Medical Decision Making  Medically screening exam initiated at 3:47 PM.  Appropriate orders placed.  Zoe Snyder was informed that the remainder of the evaluation will be completed by another provider, this initial triage assessment does not replace that evaluation, and the importance of remaining in the ED until their evaluation is complete.     Carlisle Cater, PA-C 09/28/22 1548

## 2022-09-28 NOTE — ED Triage Notes (Signed)
Pt to ED pov. Pt started having shortness of breath, weakness, fever and cough yesterday. Pt states she has not been able to cough anything up, pt c/o body aches. Pt states she does not know what her 02 usually is, does not usually wear oxygen at home.

## 2022-09-29 ENCOUNTER — Encounter (HOSPITAL_COMMUNITY): Payer: Self-pay | Admitting: Internal Medicine

## 2022-09-29 DIAGNOSIS — J441 Chronic obstructive pulmonary disease with (acute) exacerbation: Secondary | ICD-10-CM | POA: Diagnosis not present

## 2022-09-29 DIAGNOSIS — J101 Influenza due to other identified influenza virus with other respiratory manifestations: Secondary | ICD-10-CM | POA: Insufficient documentation

## 2022-09-29 DIAGNOSIS — J09X2 Influenza due to identified novel influenza A virus with other respiratory manifestations: Secondary | ICD-10-CM | POA: Diagnosis not present

## 2022-09-29 DIAGNOSIS — F17210 Nicotine dependence, cigarettes, uncomplicated: Secondary | ICD-10-CM

## 2022-09-29 DIAGNOSIS — J9601 Acute respiratory failure with hypoxia: Secondary | ICD-10-CM

## 2022-09-29 LAB — I-STAT VENOUS BLOOD GAS, ED
Acid-Base Excess: 2 mmol/L (ref 0.0–2.0)
Bicarbonate: 27.3 mmol/L (ref 20.0–28.0)
Calcium, Ion: 1.15 mmol/L (ref 1.15–1.40)
HCT: 42 % (ref 36.0–46.0)
Hemoglobin: 14.3 g/dL (ref 12.0–15.0)
O2 Saturation: 85 %
Potassium: 3.7 mmol/L (ref 3.5–5.1)
Sodium: 136 mmol/L (ref 135–145)
TCO2: 29 mmol/L (ref 22–32)
pCO2, Ven: 41.7 mmHg — ABNORMAL LOW (ref 44–60)
pH, Ven: 7.424 (ref 7.25–7.43)
pO2, Ven: 48 mmHg — ABNORMAL HIGH (ref 32–45)

## 2022-09-29 LAB — LACTIC ACID, PLASMA: Lactic Acid, Venous: 0.9 mmol/L (ref 0.5–1.9)

## 2022-09-29 LAB — PROCALCITONIN: Procalcitonin: <0.1 ng/mL

## 2022-09-29 LAB — T4, FREE: Free T4: 0.48 ng/dL — ABNORMAL LOW (ref 0.61–1.12)

## 2022-09-29 LAB — TROPONIN I (HIGH SENSITIVITY)
Troponin I (High Sensitivity): 10 ng/L (ref <18)
Troponin I (High Sensitivity): 11 ng/L (ref <18)

## 2022-09-29 LAB — GLUCOSE, CAPILLARY
Glucose-Capillary: 389 mg/dL — ABNORMAL HIGH (ref 70–99)
Glucose-Capillary: 401 mg/dL — ABNORMAL HIGH (ref 70–99)
Glucose-Capillary: 387 mg/dL — ABNORMAL HIGH (ref 70–99)

## 2022-09-29 LAB — TSH: TSH: 7.883 u[IU]/mL — ABNORMAL HIGH (ref 0.350–4.500)

## 2022-09-29 MED ORDER — LEVOTHYROXINE SODIUM 25 MCG PO TABS
50.0000 ug | ORAL_TABLET | Freq: Every day | ORAL | Status: DC
  Administered 2022-09-30 – 2022-10-01 (×2): 50 ug via ORAL
  Filled 2022-09-29 (×2): qty 2

## 2022-09-29 MED ORDER — OSELTAMIVIR PHOSPHATE 75 MG PO CAPS
75.0000 mg | ORAL_CAPSULE | Freq: Once | ORAL | Status: AC
  Administered 2022-09-29: 75 mg via ORAL
  Filled 2022-09-29: qty 1

## 2022-09-29 MED ORDER — LACTATED RINGERS IV BOLUS
1000.0000 mL | Freq: Once | INTRAVENOUS | Status: AC
  Administered 2022-09-29: 1000 mL via INTRAVENOUS

## 2022-09-29 MED ORDER — IBUPROFEN 400 MG PO TABS
600.0000 mg | ORAL_TABLET | Freq: Once | ORAL | Status: AC
  Administered 2022-09-29: 600 mg via ORAL
  Filled 2022-09-29: qty 1

## 2022-09-29 MED ORDER — CHOLECALCIFEROL 25 MCG (1000 UT) PO TABS
1.0000 | ORAL_TABLET | Freq: Every day | ORAL | Status: DC
  Filled 2022-09-29: qty 1

## 2022-09-29 MED ORDER — ASPIRIN 81 MG PO TBEC
81.0000 mg | DELAYED_RELEASE_TABLET | Freq: Every day | ORAL | Status: DC
  Administered 2022-09-29 – 2022-10-01 (×3): 81 mg via ORAL
  Filled 2022-09-29 (×3): qty 1

## 2022-09-29 MED ORDER — OSELTAMIVIR PHOSPHATE 75 MG PO CAPS
75.0000 mg | ORAL_CAPSULE | Freq: Two times a day (BID) | ORAL | Status: DC
  Administered 2022-09-29 – 2022-10-01 (×4): 75 mg via ORAL
  Filled 2022-09-29 (×4): qty 1

## 2022-09-29 MED ORDER — UMECLIDINIUM BROMIDE 62.5 MCG/ACT IN AEPB
1.0000 | INHALATION_SPRAY | Freq: Every day | RESPIRATORY_TRACT | Status: DC
  Administered 2022-09-30 – 2022-10-01 (×2): 1 via RESPIRATORY_TRACT
  Filled 2022-09-29: qty 7

## 2022-09-29 MED ORDER — IPRATROPIUM-ALBUTEROL 0.5-2.5 (3) MG/3ML IN SOLN
3.0000 mL | Freq: Four times a day (QID) | RESPIRATORY_TRACT | Status: DC
  Administered 2022-09-29: 3 mL via RESPIRATORY_TRACT
  Filled 2022-09-29 (×2): qty 3

## 2022-09-29 MED ORDER — AMLODIPINE BESYLATE 10 MG PO TABS
10.0000 mg | ORAL_TABLET | Freq: Every day | ORAL | Status: DC
  Administered 2022-09-29 – 2022-10-01 (×3): 10 mg via ORAL
  Filled 2022-09-29: qty 2
  Filled 2022-09-29 (×2): qty 1

## 2022-09-29 MED ORDER — LACTATED RINGERS IV SOLN: INTRAVENOUS | Status: AC

## 2022-09-29 MED ORDER — INSULIN ASPART 100 UNIT/ML IJ SOLN
0.0000 [IU] | Freq: Three times a day (TID) | INTRAMUSCULAR | Status: DC
  Administered 2022-09-29: 15 [IU] via SUBCUTANEOUS

## 2022-09-29 MED ORDER — METHYLPREDNISOLONE SODIUM SUCC 125 MG IJ SOLR
125.0000 mg | Freq: Once | INTRAMUSCULAR | Status: AC
  Administered 2022-09-29: 125 mg via INTRAVENOUS
  Filled 2022-09-29: qty 2

## 2022-09-29 MED ORDER — PANTOPRAZOLE SODIUM 40 MG PO TBEC
40.0000 mg | DELAYED_RELEASE_TABLET | Freq: Every day | ORAL | Status: DC
  Administered 2022-09-29 – 2022-10-01 (×3): 40 mg via ORAL
  Filled 2022-09-29 (×3): qty 1

## 2022-09-29 MED ORDER — ACETAMINOPHEN 325 MG PO TABS
650.0000 mg | ORAL_TABLET | Freq: Four times a day (QID) | ORAL | Status: DC | PRN
  Administered 2022-09-29: 650 mg via ORAL
  Filled 2022-09-29: qty 2

## 2022-09-29 MED ORDER — RIVAROXABAN 10 MG PO TABS
10.0000 mg | ORAL_TABLET | Freq: Every day | ORAL | Status: DC
  Administered 2022-09-29 – 2022-10-01 (×3): 10 mg via ORAL
  Filled 2022-09-29 (×3): qty 1

## 2022-09-29 MED ORDER — GUAIFENESIN ER 600 MG PO TB12
600.0000 mg | ORAL_TABLET | Freq: Two times a day (BID) | ORAL | Status: DC
  Administered 2022-09-29 – 2022-10-01 (×5): 600 mg via ORAL
  Filled 2022-09-29 (×5): qty 1

## 2022-09-29 MED ORDER — IPRATROPIUM-ALBUTEROL 0.5-2.5 (3) MG/3ML IN SOLN: 3.0000 mL | RESPIRATORY_TRACT | Status: DC | PRN

## 2022-09-29 MED ORDER — ATORVASTATIN CALCIUM 10 MG PO TABS
20.0000 mg | ORAL_TABLET | Freq: Every day | ORAL | Status: DC
  Administered 2022-09-29 – 2022-10-01 (×3): 20 mg via ORAL
  Filled 2022-09-29 (×3): qty 2

## 2022-09-29 MED ORDER — NICOTINE 21 MG/24HR TD PT24: 21.0000 mg | MEDICATED_PATCH | Freq: Every day | TRANSDERMAL | Status: DC | PRN

## 2022-09-29 MED ORDER — ACETAMINOPHEN 650 MG RE SUPP: 650.0000 mg | Freq: Four times a day (QID) | RECTAL | Status: DC | PRN

## 2022-09-29 MED ORDER — PREDNISONE 20 MG PO TABS
40.0000 mg | ORAL_TABLET | Freq: Every day | ORAL | Status: DC
  Administered 2022-09-30 – 2022-10-01 (×2): 40 mg via ORAL
  Filled 2022-09-29 (×2): qty 2

## 2022-09-29 MED ORDER — IPRATROPIUM-ALBUTEROL 0.5-2.5 (3) MG/3ML IN SOLN
9.0000 mL | Freq: Once | RESPIRATORY_TRACT | Status: AC
  Administered 2022-09-29: 9 mL via RESPIRATORY_TRACT
  Filled 2022-09-29: qty 9

## 2022-09-29 NOTE — Hospital Course (Signed)
COPD, no O2 requirements   Cough, fevers, fatigue  Wheezing Duonebs    Flu A positive  12/20 Feeling better. Coughing still, but feeling as tough the mucus is loosening up. Denies chills.   Exam: Crackles and mild wheezing per Dr. Philipp Ovens  Plan Observation overnight Triple therapy Schedule albuterol nebs Continue Levothyroxine

## 2022-09-29 NOTE — H&P (Cosign Needed Addendum)
Date: 09/29/2022               Patient Name:  Zoe Snyder MRN: [id number]  DOB: Mar 09, 1956 Age / Sex: 66 y.o., female   PCP: System, Provider Not In         Medical Service: Internal Medicine Teaching Service         Attending Physician: Dr. Velna Ochs, MD    First Contact: Dr. Jodi Mourning Pager: [phone]  Second Contact: Dr. Elliot Gurney Pager: [phone]       After Hours (After 5p/  First Contact Pager: [phone]  weekends / holidays): Second Contact Pager: [phone]   Chief Complaint: cough, fever, generalized weakness  History of Present Illness:   Zoe Snyder is a 66 year old female living with COPD (not on any O2 at home), tobacco use disorder (current), obesity, HTN, HLD, prediabetes, subclinical hypothyroidism (+TPO, on synthroid due to high chance of progression to overt hypothyroidism), and a history of laryngeal malignancy s/p resection (no recurrence as of 06/2022 per ENT office visit) who presented to the ED with nonspecific prodrome of cough, fevers, congestion, generalized fatigue.  She reports that her symptoms began with a significant cough about 2-3 days ago. Cough is productive of clear sputum, no change in color. She has a baseline cough from underlying COPD, but this is worse to the point where her ribs have started to hurt from coughing. She has also developed fevers, congestion, and generalized fatigue since that time and has been unable to go to work due to this. She also notes intermittent loose stools since onset of these symptoms.   She does not wear any oxygen at home in regards to COPD and has not had any hospitalizations for COPD exacerbation in the past year. She reports that decongestants cause for her to have palpitations. No documented PFTs on file, but reports that she had PFTs done in the past but does not remember results. Unclear as to why she is not on any home inhalers for management of COPD. She does use atrovent nasal spray and flonase  prn for rhinitis.  In the ED, became febrile up to 103F and hypoxic to 80s, requiring oxygen supplementation. She was found to be positive for Influenza A. No leukocytosis and CMP grossly unremarkable (Cr slightly up but not an AKI). CXR with chronic emphysematous changes but no findings to suggest infection. IMTS asked to admit patient for acute hypoxic respiratory failure from influenza A and mild COPD exacerbation.    Meds:  No current facility-administered medications on file prior to encounter.   Current Outpatient Medications on File Prior to Encounter  Medication Sig Dispense Refill   amLODipine (NORVASC) 10 MG tablet Take 1 tablet (10 mg total) by mouth daily. Due for follow up visit 30 tablet 1   aspirin EC 81 MG tablet Take 1 tablet by mouth daily.     atorvastatin (LIPITOR) 20 MG tablet Take 1 tablet by mouth daily.     Baclofen 5 MG TABS Take 5 mg by mouth 2 (two) times daily as needed. 20 tablet 0   Cholecalciferol (VITAMIN D-1000 MAX ST) 1000 units tablet Take 1 tablet by mouth daily.     fluticasone (FLONASE) 50 MCG/ACT nasal spray Place into the nose.     ipratropium (ATROVENT) 0.06 % nasal spray Place 2 sprays into both nostrils 4 (four) times daily as needed for rhinitis. 15 mL 0   Levothyroxine Sodium 50 MCG CAPS Take by mouth.  meloxicam (MOBIC) 7.5 MG tablet Take 1 tablet (7.5 mg total) by mouth daily. 10 tablet 0   Multiple Vitamin (MULTI-VITAMIN) tablet Take by mouth.     omeprazole (PRILOSEC) 20 MG capsule Take by mouth.       Allergies: Allergies as of 09/28/2022 - Review Complete 09/28/2022  Allergen Reaction Noted   Epinephrine Anaphylaxis 01/02/2003   Theraflu severe cold daytime [dm-phenylephrine-acetaminophen] Palpitations 10/28/2013   Past Medical History:  Diagnosis Date   COPD (chronic obstructive pulmonary disease) ([place])    Hashimoto's thyroiditis    History of alcohol abuse 11/30/2017   History of cocaine abuse ([place]) 11/30/2017   History of  colon polyps    Hyperlipidemia    Hypertension    Prediabetes    Throat cancer ([place])    Tobacco use disorder     Family History:  Family History  Problem Relation Age of Onset   Hypertension Mother    Diabetes Maternal Grandmother    Colon cancer Maternal Aunt      Social History:  -lives with roommate (who is close to her, good support -son lives in [place] -previously independent in ADLs and iADLs -works at [employer] -40 pack-year cigarette smoking, has cut down to half-pack a day now -social alcohol user -no recreational drug use  Review of Systems: A complete ROS was negative except as per HPI.   Physical Exam: Blood pressure 94/67, pulse 92, temperature 99.8 F (37.7 C), temperature source Oral, resp. rate 17, height '5\' 4"'$  (1.626 m), weight 81.6 kg, SpO2 94 %. General: obese elderly female, laying in bed, NAD. HENT: [place]/AT, dry mucus membranes. Eyes: PERRL, EOMI, anicteric sclerae. CV: normal rate and regular rhythm, no m/r/g. Pulm: fine bibasilar crackles, no wheezing or rhonchi noted. Saturating 90-93% on 2L [place] at rest, no respiratory distress noted.  Abdomen: soft, nondistended, nontender, normoactive bowel sounds. Extremities: warm and well perfused, no peripheral edema. Neuro: AAOx3, no focal deficits noted. Psych: calm and conversant.  EKG: personally reviewed my interpretation is sinus tachycardia with TWI in leads 1 and aVL (no prior baseline for comparison).   DG Chest 2 View Result Date: 09/28/2022 IMPRESSION: Chronic emphysematous changes. No acute lung process. Electronically Signed   By: Yvonne Kendall M.D.   On: 09/28/2022 17:41     Assessment & Plan by Problem: Principal Problem:   Acute respiratory failure with hypoxia (HCC) Active Problems:   Obesity   Subclinical hypothyroidism   COPD with acute exacerbation (HCC)   Mixed hyperlipidemia   Hypertension goal BP (blood pressure) < 130/80   Prediabetes   Influenza due to influenza A  virus  Acute hypoxic respiratory failure Influenza A infection COPD with acute exacerbation Current tobacco use disorder Patient presented with a viral prodrome, found to be influenza A positive. She does have a new oxygen requirement of about 2-3L [place] and is not on any home O2. Noted to have a high fever of 103F, and thus blood cultures were ordered. Lactate normal. VBG with normal pH, slightly low pCO2, normal bicarb. Given wheezing and O2 requirement, management of likely COPD exacerbation was initiated with duonebs and solumedrol. Tamiflu also began for 5-day course. Will have her ambulate with PT and check ambulatory oxygen saturations. Otherwise, primarily supportive care for viral infection along with bronchodilators and steroid therapy for COPD exacerbation. -tamiflu BID for 5-day course -duonebs q6h, incruse ellipta daily -prednisone '40mg'$  daily from tomorrow (end without taper) -guaifenesin BID -oxygen supplementation to maintain O2 sats 88-92%, wean as  tolerated -LR '@125'$  cc/hr for 8 hours -tylenol prn -f/u procalcitonin, blood cultures, expectorated sputum cx -droplet precautions -PT/OT eval, ambulatory O2 saturations -TOC consult for substance use resources -nicotine patch  HTN HLD On norvasc '10mg'$  daily and lipitor '20mg'$  daily. -continue home meds  Prediabetes Obesity Last A1c 6.0% in 11/2017. Will repeat A1c while here and will start SSI given steroid therapy. -f/u A1c -moderate SSI -trend CBGs with goal 140-180  Subclinical hypothyroidism Found be TPO + in 2018. Was initiated on synthroid 69mg daily at that time due to high chance of progression into overt hypothyroidism. TSH and free T4 obtained by EDP. 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 She is s/p endoscopic partial laryngectomy in 2016. Recent visit with ENT in 06/2022  without any evidence of recurrent disease.    Diet: HH DVT ppx: xarelto ppx CODE Status: FULL CODE   Dispo: Admit patient to Observation with expected length of stay less than 2 midnights.  Signed: JiVirl AxeMD 09/29/2022, 12:11 PM  Pager: [phone] 5pm on weekdays and 1pm on weekends: On Call pager: [phone]

## 2022-09-29 NOTE — ED Notes (Signed)
Transport here to take pt to [facility]

## 2022-09-29 NOTE — ED Notes (Signed)
This EMT observed the patient's oxygen saturation had dropped down to 87 percent with a good waveform. This EMT also observed that the patient was not wearing her oxygen, this EMT placed the oxygen back on. The original liter flow at 2 liters was not improving the patient's oxygen saturation, leading this EMT to raise the liter flow to 3 liters. The patient improved to 94 percent oxygen saturation. This EMT informed the RN of the situation. Will continue to monitor.

## 2022-09-29 NOTE — Progress Notes (Signed)
Subjective:  Xxx    Objective: Vitals over previous 24hr: Vitals:   09/29/22 1200 09/29/22 1215 09/29/22 1230 09/29/22 1243  BP: (!) 142/77 (!) 154/82 (!) 154/96   Pulse: 85 82 83   Resp: (!) 26 (!) 22 19   Temp:    98.5 F (36.9 C)  TempSrc:    Oral  SpO2: 94% 96% 91%   Weight:      Height:        General:      awake and alert, lying comfortably in bed, cooperative, not in acute distress Skin:       warm and dry, intact without any obvious lesions or scars, no rashes Head:      normocephalic and atraumatic, oral mucosa moist with good dentition, no lymphadenopathy Eyes:      extraocular movements intact, conjunctivae pink, pupils round and reactive to light, no periorbital swelling or scleral icterus Ears:       pinnae normal, no discharge or external lesions  Nose:      symmetrical and without mucosal inflammation, no external lesions or discharge Lungs:      normal respiratory effort, breathing unlabored, symmetrical chest rise, no crackles or wheezing Cardiac:      regular rate and rhythm, normal S1 and S2, capillary refill 2-3 seconds, no pitting edema Abdomen:      soft and non-distended, normoactive bowel sounds present in all four quadrants, no tenderness to palpation or guarding Musculoskeletal:  full range of motion in joints, motor strength 5 /5 in all four extremities, no obvious deformities Neurologic:      oriented to person-place-time, moving all extremities, sensation to light touch intact, no gross focal deficits Psychiatric:      euthymic mood with congruent affect, intelligible speech    Assessment/Plan: Ms Mill is a 66 year old female with a past medical history of COPD, tobacco use, prediabetes, hypothyroidism, hypertension, and hyperlipidemia who presented with breath shortness and cough, now admitted for COPD exacerbation and hypoxic respiratory failure secondary to influenza A infection.   ---Acute hypoxic respiratory  failure ---Chronic obstructive pulmonary disease exacerbation ---Influenza A infection ---Current tobacco use Patient has history of COPD without needing supplemental oxygen at home. She presented with upper respiratory symptoms and a new oxygen requirement of 2-3L/min. Upon arrival, vital signs were notable for Temp 103 and RR 26. Laboratory testing positive for influenza A and slightly low pCO2. Treatment with ipratropium-albuterol, umeclidinium, and prednisone was initiated for suspected COPD exacerbation. Five-day course of oseltamivir was started for influenza A infection. > Oseltamivir '75mg'$  q12, last dose 12-23 > Acetaminophen '650mg'$  q6 PRN > Ipratropium-albuterol 0.5-2.'5mg'$  q6 > Umeclidinium bromide 62.5ug q24 > Prednisone '40mg'$  q24, last dose 12-22 > Guaifenesin '600mg'$  q12 > Nicotine patch '21mg'$  q24 > Oxygen supplementation to maintain SpO2 88-92% > Follow blood cultures, no growth day 1 > Physical-occupational therapy evaluation with ambulatory SpO2   ---Hypertension ---Hyperlipidemia Patient has history of hypertension and hyperlipidemia. She takes amlodipine and atorvastatin at home. > Amlodipine '10mg'$  q24 > Atorvastatin '20mg'$  q24   ---Prediabetes ---Obesity Patient has history of prediabetes, most recent A1C obtained 11-2017 was 6.0. Given prednisone treatment, primary team will implement sliding scale insulin to prevent hyperglycemia while hospitalized. > Insulin, sliding scale moderate sensitivity > Trend CBGs q6 > Follow A1C results   ---Subclinical hypothyroidism Patient tested positive for TPO antibodies in 2018 and was subsequently started on levothyroxine at that time due to high likelihood of progressing into hypothyroidism.  > Levothyroxine 50ug q24 >  Follow TSH and free T4 results   ---Laryngeal squamous cell carcinoma post resection 2016 Patient was diagnosed with laryngeal SCC suspected secondary to tobacco use. She underwent endoscopic partial laryngectomy in  2016, recent evaluation per otolaryngology without evidence of recurrent disease. > Monitor clinically    Principal Problem:   Acute respiratory failure with hypoxia ([place]) Active Problems:   Obesity   Subclinical hypothyroidism   COPD with acute exacerbation (HCC)   Mixed hyperlipidemia   Hypertension goal BP (blood pressure) < 130/80   Prediabetes   Influenza due to influenza A virus    Prior to Admission Living Arrangement: home with roommate Anticipated Discharge Location: home with roommate   Barriers to Discharge: oxygen requirement, symptom management Dispo: Anticipated discharge in approximately 1-2 day(s).    Roswell Nickel, MD Internal Medicine PGY-1 Pager [phone]  After 5pm on weekdays and 1pm on weekends: On Call pager [phone]

## 2022-09-29 NOTE — Evaluation (Signed)
Physical Therapy Evaluation Patient Details Name: Zoe Snyder MRN: [id number] DOB: Nov 14, 1955 Today's Date: 09/29/2022  History of Present Illness  66 yo female presents to [facility] on 12/18 with cough, fevers, fatigue. PMH includes COPD (not on any O2 at home), tobacco use disorder (current), obesity, HTN, HLD, prediabetes, subclinical hypothyroidism, and a history of laryngeal malignancy s/p resection.  Clinical Impression   Pt presents with increased time and effort to mobilize given respiratory status, otherwise pt is at baseline level of strength, balance, and mobility. Pt ambulated good hallway distance, SPO2 87-91% on RA during gait with only brief dip to 87%. PT placed pt back on 2LO2 at end of session. Pt with no further acute PT needs, PT encouraging mobility specialists and other staff to continue to mobilize pt while acute to keep her at mod I level.     Recommendations for follow up therapy are one component of a multi-disciplinary discharge planning process, led by the attending physician.  Recommendations may be updated based on patient status, additional functional criteria and insurance authorization.  Follow Up Recommendations No PT follow up      Assistance Recommended at Discharge PRN  Patient can return home with the following       Equipment Recommendations None recommended by PT  Recommendations for Other Services       Functional Status Assessment Patient has not had a recent decline in their functional status     Precautions / Restrictions Precautions Precautions: Fall Restrictions Weight Bearing Restrictions: No      Mobility  Bed Mobility Overal bed mobility: Modified Independent                  Transfers Overall transfer level: Modified independent                      Ambulation/Gait Ambulation/Gait assistance: Modified independent (Device/Increase time) Gait Distance (Feet): 215 Feet Assistive device: IV Pole, None Gait  Pattern/deviations: Step-through pattern, Decreased stride length Gait velocity: decr     General Gait Details: mod I for increased time, SpO2 87% and greater on RA mostly maintaining sats 88-91% dduring gait  Stairs            Wheelchair Mobility    Modified Rankin (Stroke Patients Only)       Balance Overall balance assessment: Mild deficits observed, not formally tested                                           Pertinent Vitals/Pain Pain Assessment Pain Assessment: No/denies pain    Home Living Family/patient expects to be discharged to:: Private residence Living Arrangements: Non-relatives/Friends (roommate) Available Help at Discharge: Friend(s);Available PRN/intermittently Type of Home: House Home Access: Stairs to enter   [employer] of Steps: flight   Home Layout: Bed/bath upstairs Home Equipment: None      Prior Function Prior Level of Function : Independent/Modified Independent                     Hand Dominance   Dominant Hand: Right    Extremity/Trunk Assessment   Upper Extremity Assessment Upper Extremity Assessment: Defer to OT evaluation    Lower Extremity Assessment Lower Extremity Assessment: Overall WFL for tasks assessed    Cervical / Trunk Assessment Cervical / Trunk Assessment: Normal  Communication   Communication: No difficulties  Cognition  Arousal/Alertness: Awake/alert Behavior During Therapy: WFL for tasks assessed/performed Overall Cognitive Status: Within Functional Limits for tasks assessed                                          General Comments      Exercises     Assessment/Plan    PT Assessment Patient does not need any further PT services  PT Problem List         PT Treatment Interventions      PT Goals (Current goals can be found in the Care Plan section)  Acute Rehab PT Goals Patient Stated Goal: home PT Goal Formulation: With patient Time  For Goal Achievement: 09/29/22 Potential to Achieve Goals: Good    Frequency       Co-evaluation               AM-PAC PT "6 Clicks" Mobility  Outcome Measure Help needed turning from your back to your side while in a flat bed without using bedrails?: None Help needed moving from lying on your back to sitting on the side of a flat bed without using bedrails?: None Help needed moving to and from a bed to a chair (including a wheelchair)?: None Help needed standing up from a chair using your arms (e.g., wheelchair or bedside chair)?: None Help needed to walk in hospital room?: None Help needed climbing 3-5 steps with a railing? : None 6 Click Score: 24    End of Session Equipment Utilized During Treatment: Oxygen (placed back on 2LO2 at end of session) Activity Tolerance: Patient tolerated treatment well Patient left: in bed;with call bell/phone within reach Nurse Communication: Mobility status PT Visit Diagnosis: Other abnormalities of gait and mobility (R26.89)    Time: 7793-9030 PT Time Calculation (min) (ACUTE ONLY): 20 min   Charges:   PT Evaluation $PT Eval Low Complexity: 1 Low         Massey Ruhland S, PT DPT Acute Rehabilitation Services Pager [phone]  Office [phone]   [place] 09/29/2022, 6:18 PM

## 2022-09-29 NOTE — ED Provider Notes (Signed)
[facility] EMERGENCY DEPARTMENT Provider Note   CSN: [id number] Arrival date & time: 09/28/22  1449     History  Chief Complaint  Patient presents with   Cough   Shortness of Breath    Zoe Snyder is a 66 y.o. female.  With PMH of HTN, HLD, COPD, smoker presenting with shortness of breath, cough and fever.  Patient said her symptoms started in the past 2 days.  She has been having excessive nonproductive cough with associated dyspnea at rest and on exertion.  She says she has some mild substernal nonradiating chest pain after coughing fits.  She also endorses generalized fatigue and weakness but no localizing weakness or loss of sensation.  She is just felt unwell she has not really been getting around.  She has had fevers with the symptoms.  Also reporting intermittent diarrhea but no vomiting.  Generally decreased p.o. intake.  Has not been on any antibiotics or steroids recently.  Has not been using any breathing treatments for her symptoms because she does not like the way it makes her feel.   Cough Associated symptoms: shortness of breath   Shortness of Breath Associated symptoms: cough        Home Medications Prior to Admission medications   Medication Sig Start Date End Date Taking? Authorizing Provider  amLODipine (NORVASC) 10 MG tablet Take 1 tablet (10 mg total) by mouth daily. Due for follow up visit 08/05/21   Sherwood Gambler, MD  aspirin EC 81 MG tablet Take 1 tablet by mouth daily. 01/07/17   [provider]  atorvastatin (LIPITOR) 20 MG tablet Take 1 tablet by mouth daily. 01/07/17   [provider]  Baclofen 5 MG TABS Take 5 mg by mouth 2 (two) times daily as needed. 05/22/22   Raspet, Derry Skill, PA-C  Cholecalciferol (VITAMIN D-1000 MAX ST) 1000 units tablet Take 1 tablet by mouth daily. 01/07/17   [provider]  fluticasone (FLONASE) 50 MCG/ACT nasal spray Place into the nose. 01/08/17   [provider]   ipratropium (ATROVENT) 0.06 % nasal spray Place 2 sprays into both nostrils 4 (four) times daily as needed for rhinitis. 09/11/20   Zigmund Gottron, NP  Levothyroxine Sodium 50 MCG CAPS Take by mouth. 02/11/17   [provider]  meloxicam (MOBIC) 7.5 MG tablet Take 1 tablet (7.5 mg total) by mouth daily. 05/22/22   Raspet, Derry Skill, PA-C  Multiple Vitamin (MULTI-VITAMIN) tablet Take by mouth. 05/08/16   [provider]  omeprazole (PRILOSEC) 20 MG capsule Take by mouth. 01/07/17   [provider]      Allergies    Epinephrine and Theraflu severe cold daytime [dm-phenylephrine-acetaminophen]    Review of Systems   Review of Systems  Respiratory:  Positive for cough and shortness of breath.     Physical Exam Updated Vital Signs BP 94/67   Pulse 92   Temp 99.8 F (37.7 C) (Oral)   Resp 17   Ht '5\' 4"'$  (1.626 m)   Wt 81.6 kg   SpO2 94%   BMI 30.90 kg/m  Physical Exam Constitutional: Alert and oriented.  Chronically ill in appearance and fatigued but no acute distress Eyes: Conjunctivae are normal. ENT      Head: Normocephalic and atraumatic.      Neck: No stridor. Cardiovascular: S1, S2, borderline tachycardic, warm well-perfused, equal radial pulses Respiratory: Mildly tachypneic high 20s, coarse breath sounds with faint wheezes, O2 sat 83 on room air at  rest placed on 3L [place] Gastrointestinal: Soft and nontender.  Musculoskeletal: Normal range of motion in all extremities. No pitting edema of lower extremities Neurologic: Normal speech and language. No gross focal neurologic deficits are appreciated. Skin: Skin is warm, dry and intact. No rash noted. Psychiatric: Mood and affect are normal. Speech and behavior are normal.  ED Results / Procedures / Treatments   Labs (all labs ordered are listed, but only abnormal results are displayed) Labs Reviewed  RESP PANEL BY RT-PCR (RSV, FLU A&B, COVID)  RVPGX2 - Abnormal; Notable for the following components:       Result Value   Influenza A by PCR POSITIVE (*)    All other components within normal limits  CBC WITH DIFFERENTIAL/PLATELET - Abnormal; Notable for the following components:   Lymphs Abs 0.5 (*)    All other components within normal limits  COMPREHENSIVE METABOLIC PANEL - Abnormal; Notable for the following components:   Glucose, Bld 137 (*)    Creatinine, Ser 1.17 (*)    GFR, Estimated 52 (*)    All other components within normal limits  I-STAT VENOUS BLOOD GAS, ED - Abnormal; Notable for the following components:   pCO2, Ven 41.7 (*)    pO2, Ven 48 (*)    All other components within normal limits  CULTURE, BLOOD (ROUTINE X 2)  CULTURE, BLOOD (ROUTINE X 2)  LACTIC ACID, PLASMA  LACTIC ACID, PLASMA  TSH  T4, FREE  TROPONIN I (HIGH SENSITIVITY)  TROPONIN I (HIGH SENSITIVITY)    EKG EKG Interpretation  Date/Time:  Monday September 28 2022 15:40:48 EST Ventricular Rate:  102 PR Interval:  176 QRS Duration: 86 QT Interval:  350 QTC Calculation: 456 R Axis:   -44 Text Interpretation: Sinus tachycardia Left axis deviation Septal infarct , age undetermined T wave abnormality, consider lateral ischemia Abnormal ECG No previous ECGs available T wave inversions lateral leads I and aVL.  No previous EKGs to compare to Confirmed by Georgina Snell [phone]) on 09/29/2022 9:20:20 AM  Radiology DG Chest 2 View  Result Date: 09/28/2022 CLINICAL DATA:  Cough, shortness of breath. Evaluate for pneumonia. Fever at home. EXAM: CHEST - 2 VIEW COMPARISON:  Chest two views 08/05/2021 and 10/08/2020, CT chest 03/04/2022 FINDINGS: Cardiac silhouette and mediastinal contours are within normal limits. Mild calcification within aortic arch. Flattening of the diaphragms and moderate hyperinflation again suggesting chronic emphysematous change. No acute airspace opacity. No pleural effusion or pneumothorax. No acute skeletal abnormality. IMPRESSION: Chronic emphysematous changes. No acute lung process.  Electronically Signed   By: Yvonne Kendall M.D.   On: 09/28/2022 17:41    Procedures .Critical Care  Performed by: Elgie Congo, MD Authorized by: Elgie Congo, MD   Critical care provider statement:    Critical care time (minutes):  30   Critical care was necessary to treat or prevent imminent or life-threatening deterioration of the following conditions:  Respiratory failure   Critical care was time spent personally by me on the following activities:  Development of treatment plan with patient or surrogate, discussions with consultants, evaluation of patient's response to treatment, examination of patient, ordering and review of laboratory studies, ordering and review of radiographic studies, ordering and performing treatments and interventions, pulse oximetry, re-evaluation of patient's condition, review of old charts and obtaining history from patient or surrogate   Care discussed with: admitting provider      Medications Ordered in ED Medications  acetaminophen (TYLENOL) tablet 1,000 mg (1,000 mg Oral Given 09/28/22  3382)  ipratropium-albuterol (DUONEB) 0.5-2.5 (3) MG/3ML nebulizer solution 9 mL (9 mLs Nebulization Given 09/29/22 1004)  lactated ringers bolus 1,000 mL (1,000 mLs Intravenous New Bag/Given 09/29/22 1038)  methylPREDNISolone sodium succinate (SOLU-MEDROL) 125 mg/2 mL injection 125 mg (125 mg Intravenous Given 09/29/22 1029)  ibuprofen (ADVIL) tablet 600 mg (600 mg Oral Given 09/29/22 1030)  oseltamivir (TAMIFLU) capsule 75 mg (75 mg Oral Given 09/29/22 1100)    ED Course/ Medical Decision Making/ A&P Clinical Course as of 09/29/22 1128  Tue Sep 29, 2022  1110 Discussed case with on-call internal medicine team who will be down to evaluate the patient with plans for likely admission however they will also potentially evaluate for sending patient home with home oxygen and close follow-up. They will alert me if plan changes. [VB]  1127 Patient admitted to  internal medicine team.  Troponin still pending.  Lactate 0.9 and reassuring. [VB]    Clinical Course User Index [VB] Elgie Congo, MD                           Medical Decision Making Zoe Snyder is a 66 y.o. female.  With PMH of HTN, HLD, COPD, smoker presenting with shortness of breath, cough and fever.    Patient on my exam satting 83% on room air at rest with coarse breath sounds mixed with faint wheezes.  Initial temperature 39.4 C with mild tachycardia 102.  Blood pressure stable.  Patient's presentation could be secondary to multiple underlying etiologies including COPD exacerbation secondary to bacterial pneumonia versus viral URI.  No pitting edema or secondary findings on exam suggestive of CHF and no previous echo.  EKG sinus tachycardia with nonspecific T wave inversions lateral leads no previous to compare to.  Patient had some workup obtained in triage notable for viral panel with influenza A positive.  Based on history and presentation suspect influenza driving mild COPD exacerbation and hypoxia.  Chest x-ray obtained which I personally reviewed no consolidation concerning for pneumonia, no pneumothorax, no pleural effusion.  Doubt bacterial sepsis with normal white blood cell count 6.9, no left shift, no productive cough, no focal consolidation but will send blood cultures and lactate.  Creatinine 1.17 slightly elevated likely prerenal from decreased p.o. intake.  Potassium 3.8 within normal limits.  Ordered for DuoNebs x 3, IV Solu-Medrol and oseltamivir with IV fluids.  Admitted to internal medicine team for hypoxic respiratory failure in the setting of influenza A and new O2 requirement.  Amount and/or Complexity of Data Reviewed Labs: ordered.  Risk Prescription drug management. Decision regarding hospitalization.    Final Clinical Impression(s) / ED Diagnoses Final diagnoses:  Hypoxia  Influenza A    Rx / DC Orders ED Discharge Orders     None          Elgie Congo, MD 09/29/22 1128

## 2022-09-30 DIAGNOSIS — J09X2 Influenza due to identified novel influenza A virus with other respiratory manifestations: Secondary | ICD-10-CM | POA: Diagnosis not present

## 2022-09-30 DIAGNOSIS — F17210 Nicotine dependence, cigarettes, uncomplicated: Secondary | ICD-10-CM | POA: Diagnosis not present

## 2022-09-30 DIAGNOSIS — J441 Chronic obstructive pulmonary disease with (acute) exacerbation: Secondary | ICD-10-CM | POA: Diagnosis not present

## 2022-09-30 DIAGNOSIS — J9601 Acute respiratory failure with hypoxia: Secondary | ICD-10-CM | POA: Diagnosis not present

## 2022-09-30 LAB — CBC WITH DIFFERENTIAL/PLATELET
Abs Immature Granulocytes: 0.06 10*3/uL (ref 0.00–0.07)
Basophils Absolute: 0 10*3/uL (ref 0.0–0.1)
Basophils Relative: 0 %
Eosinophils Absolute: 0 10*3/uL (ref 0.0–0.5)
Eosinophils Relative: 0 %
HCT: 38.3 % (ref 36.0–46.0)
Hemoglobin: 12.9 g/dL (ref 12.0–15.0)
Immature Granulocytes: 1 %
Lymphocytes Relative: 6 %
Lymphs Abs: 0.8 10*3/uL (ref 0.7–4.0)
MCH: 30.9 pg (ref 26.0–34.0)
MCHC: 33.7 g/dL (ref 30.0–36.0)
MCV: 91.6 fL (ref 80.0–100.0)
Monocytes Absolute: 0.7 10*3/uL (ref 0.1–1.0)
Monocytes Relative: 6 %
Neutro Abs: 10.4 10*3/uL — ABNORMAL HIGH (ref 1.7–7.7)
Neutrophils Relative %: 87 %
Platelets: 169 10*3/uL (ref 150–400)
RBC: 4.18 MIL/uL (ref 3.87–5.11)
RDW: 13.3 % (ref 11.5–15.5)
WBC: 12 10*3/uL — ABNORMAL HIGH (ref 4.0–10.5)
nRBC: 0 % (ref 0.0–0.2)

## 2022-09-30 LAB — GLUCOSE, CAPILLARY
Glucose-Capillary: 121 mg/dL — ABNORMAL HIGH (ref 70–99)
Glucose-Capillary: 178 mg/dL — ABNORMAL HIGH (ref 70–99)
Glucose-Capillary: 255 mg/dL — ABNORMAL HIGH (ref 70–99)
Glucose-Capillary: 236 mg/dL — ABNORMAL HIGH (ref 70–99)

## 2022-09-30 LAB — HEMOGLOBIN A1C
Hgb A1c MFr Bld: 6.1 % — ABNORMAL HIGH (ref 4.8–5.6)
Mean Plasma Glucose: 128 mg/dL

## 2022-09-30 LAB — BASIC METABOLIC PANEL
Anion gap: 10 (ref 5–15)
BUN: 18 mg/dL (ref 8–23)
CO2: 24 mmol/L (ref 22–32)
Calcium: 8.9 mg/dL (ref 8.9–10.3)
Chloride: 104 mmol/L (ref 98–111)
Creatinine, Ser: 0.77 mg/dL (ref 0.44–1.00)
GFR, Estimated: >60 mL/min (ref >60)
Glucose, Bld: 121 mg/dL — ABNORMAL HIGH (ref 70–99)
Potassium: 3.7 mmol/L (ref 3.5–5.1)
Sodium: 138 mmol/L (ref 135–145)

## 2022-09-30 MED ORDER — INSULIN ASPART 100 UNIT/ML IJ SOLN
0.0000 [IU] | Freq: Three times a day (TID) | INTRAMUSCULAR | Status: DC
  Administered 2022-09-30: 4 [IU] via SUBCUTANEOUS
  Administered 2022-09-30: 11 [IU] via SUBCUTANEOUS
  Administered 2022-10-01: 3 [IU] via SUBCUTANEOUS

## 2022-09-30 MED ORDER — VITAMIN D 25 MCG (1000 UNIT) PO TABS
1000.0000 [IU] | ORAL_TABLET | Freq: Every day | ORAL | Status: DC
  Administered 2022-09-30 – 2022-10-01 (×2): 1000 [IU] via ORAL
  Filled 2022-09-30 (×2): qty 1

## 2022-09-30 MED ORDER — ALBUTEROL SULFATE (2.5 MG/3ML) 0.083% IN NEBU: 2.5000 mg | INHALATION_SOLUTION | RESPIRATORY_TRACT | Status: DC

## 2022-09-30 MED ORDER — FLUTICASONE FUROATE-VILANTEROL 100-25 MCG/ACT IN AEPB
1.0000 | INHALATION_SPRAY | Freq: Every day | RESPIRATORY_TRACT | Status: DC
  Administered 2022-10-01: 1 via RESPIRATORY_TRACT
  Filled 2022-09-30: qty 28

## 2022-09-30 MED ORDER — IPRATROPIUM-ALBUTEROL 0.5-2.5 (3) MG/3ML IN SOLN: 3.0000 mL | RESPIRATORY_TRACT | Status: DC

## 2022-09-30 MED ORDER — ALBUTEROL SULFATE (2.5 MG/3ML) 0.083% IN NEBU: 2.5000 mg | INHALATION_SOLUTION | Freq: Four times a day (QID) | RESPIRATORY_TRACT | Status: DC | PRN

## 2022-09-30 MED ORDER — INSULIN ASPART 100 UNIT/ML IJ SOLN
0.0000 [IU] | Freq: Every day | INTRAMUSCULAR | Status: DC
  Administered 2022-09-30: 2 [IU] via SUBCUTANEOUS

## 2022-09-30 NOTE — Progress Notes (Signed)
Mobility Specialist Progress Note:   09/30/22 1129  Mobility  Activity Ambulated independently in hallway  Level of Assistance Independent  Assistive Device None  Distance Ambulated (ft) 500 ft  Activity Response Tolerated well  Mobility Referral Yes  $Mobility charge 1 Mobility   Pt received in bed and agreeable. C/o wheezing at end of ambulation. Pt left sitting EOB with all needs met and call bell in reach.   Andrey Campanile Mobility Specialist Please contact via SecureChat or  Rehab office at [phone]

## 2022-09-30 NOTE — Care Management Obs Status (Cosign Needed)
[facility] NOTIFICATION   Patient Details  Name: Zoe Snyder MRN: [id number] Date of Birth: 1956/09/12   Medicare Observation Status Notification Given:  Yes    Curlene Labrum, RN 09/30/2022, 1:55 PM

## 2022-09-30 NOTE — Evaluation (Signed)
Occupational Therapy Evaluation Patient Details Name: Zoe Snyder MRN: [id number] DOB: Jul 26, 1956 Today's Date: 09/30/2022   History of Present Illness 67 yo female presents to [facility] on 12/18 with cough, fevers, fatigue. PMH includes COPD (not on any O2 at home), tobacco use disorder (current), obesity, HTN, HLD, prediabetes, subclinical hypothyroidism, and a history of laryngeal malignancy s/p resection.   Clinical Impression   Patient admitted for the diagnosis above.  PTA she lives with her cousin, continues to work, and needed no assist with ADL, iADL or mobility.  Deficits are SOB and O2 saturations.  Otherwise, she is very close to there baseline for function.  OT spent time educating on O2 needs and energy conservation.  Patient verbalizes understanding.  No acute OT needs exist, with no post acute OT anticipated.        Recommendations for follow up therapy are one component of a multi-disciplinary discharge planning process, led by the attending physician.  Recommendations may be updated based on patient status, additional functional criteria and insurance authorization.   Follow Up Recommendations  No OT follow up     Assistance Recommended at Discharge None  Patient can return home with the following      Functional Status Assessment  Patient has not had a recent decline in their functional status  Equipment Recommendations  None recommended by OT    Recommendations for Other Services       Precautions / Restrictions Precautions Precautions: Fall Precaution Comments: watch O2 Restrictions Weight Bearing Restrictions: No      Mobility Bed Mobility Overal bed mobility: Independent               Patient Response: Cooperative  Transfers Overall transfer level: Modified independent                        Balance Overall balance assessment: Mild deficits observed, not formally tested                                          ADL either performed or assessed with clinical judgement   ADL Overall ADL's : At baseline                                             Vision Patient Visual Report: No change from baseline       Perception     Praxis      Pertinent Vitals/Pain Pain Assessment Pain Assessment: No/denies pain     Hand Dominance Right   Extremity/Trunk Assessment Upper Extremity Assessment Upper Extremity Assessment: Overall WFL for tasks assessed   Lower Extremity Assessment Lower Extremity Assessment: Overall WFL for tasks assessed   Cervical / Trunk Assessment Cervical / Trunk Assessment: Normal   Communication Communication Communication: No difficulties   Cognition Arousal/Alertness: Awake/alert Behavior During Therapy: WFL for tasks assessed/performed Overall Cognitive Status: Within Functional Limits for tasks assessed                                       General Comments   O2 sat 84% with O2 off    Exercises     Shoulder Instructions  Home Living Family/patient expects to be discharged to:: Private residence Living Arrangements: Non-relatives/Friends Available Help at Discharge: Friend(s);Available PRN/intermittently Type of Home: House Home Access: Stairs to enter [employer] of Steps: flight   Home Layout: Bed/bath upstairs     Bathroom Shower/Tub: Teacher, early years/pre: Standard Bathroom Accessibility: Yes How Accessible: Accessible via walker Home Equipment: None          Prior Functioning/Environment Prior Level of Function : Independent/Modified Independent;Working/employed                        OT Problem List: Decreased activity tolerance      OT Treatment/Interventions:      OT Goals(Current goals can be found in the care plan section) Acute Rehab OT Goals Patient Stated Goal: Return home OT Goal Formulation: With patient Time For Goal Achievement:  10/05/22 Potential to Achieve Goals: Good  OT Frequency:      Co-evaluation              AM-PAC OT "6 Clicks" Daily Activity     Outcome Measure Help from another person eating meals?: None Help from another person taking care of personal grooming?: None Help from another person toileting, which includes using toliet, bedpan, or urinal?: None Help from another person bathing (including washing, rinsing, drying)?: None Help from another person to put on and taking off regular upper body clothing?: None Help from another person to put on and taking off regular lower body clothing?: None 6 Click Score: 24   End of Session Equipment Utilized During Treatment: Oxygen Nurse Communication: Mobility status  Activity Tolerance: Patient tolerated treatment well Patient left: in bed;with call bell/phone within reach  OT Visit Diagnosis: Unsteadiness on feet (R26.81)                Time: 5852-7782 OT Time Calculation (min): 22 min Charges:  OT General Charges $OT Visit: 1 Visit OT Evaluation $OT Eval Moderate Complexity: 1 Mod  09/30/2022  RP, OTR/L  Acute Rehabilitation Services  Office:  [phone]   Metta Clines 09/30/2022, 8:45 AM

## 2022-09-30 NOTE — TOC Initial Note (Signed)
Transition of Care [facility]) - Initial/Assessment Note    Patient Details  Name: Zoe Snyder MRN: [id number] Date of Birth: June 20, 1956  Transition of Care [facility]) CM/SW Contact:    Curlene Labrum, RN Phone Number: 09/30/2022, 1:48 PM  Clinical Narrative:                 CM met with the patient at the bedside to discuss transitions of care needs.  The patient lives at home with her cousin.  She states she know that she is not supposed to smoke since her throat cancer diagnosis 4 years ago.  She was agreeable to smoking cessation information - to be included in the discharge instructions.  The patient's PCP is [facility] and they provide transportation to/from PCP appointments.  Transportation community resources will be included in the instructions as well.  The patient uses transportation assistance through her Medicare as well.  The patient currently has Medicare insurance but states that her Medicaid is being taken away since she has been working a job.  The patient plans to follow up with the social security office for follow up assistance.  CM will continue to follow the patient for TOC needs.  Patient plans to arrange transportation home with friend when she is discharged.  Planned Disposition: Home Barriers to Discharge: Continued Medical Work up   Patient Goals and CMS Choice Patient states their goals for this hospitalization and ongoing recovery are:: To get better and go home CMS Medicare.gov Compare Post Acute Care list provided to:: Patient Choice offered to / list presented to : Patient [place] ownership interest in [facility].provided to:: Patient    Expected Discharge Plan and Services Planned Disposition: Home   Discharge Planning Services: CM Consult   Living arrangements for the past 2 months: Single Family Home                                      Prior Living Arrangements/Services Living arrangements for the past 2  months: Single Family Home Lives with:: Relatives (LIves with her cousin) Patient language and need for interpreter reviewed:: Yes Do you feel safe going back to the place where you live?: Yes      Need for Family Participation in Patient Care: Yes (Comment) Care giver support system in place?: Yes (comment)   Criminal Activity/Legal Involvement Pertinent to Current Situation/Hospitalization: No - Comment as needed  Activities of Daily Living Home Assistive Devices/Equipment: None ADL Screening (condition at time of admission) Patient's cognitive ability adequate to safely complete daily activities?: Yes Is the patient deaf or have difficulty hearing?: No Does the patient have difficulty seeing, even when wearing glasses/contacts?: No Does the patient have difficulty concentrating, remembering, or making decisions?: No Patient able to express need for assistance with ADLs?: Yes Does the patient have difficulty dressing or bathing?: No Independently performs ADLs?: Yes (appropriate for developmental age) Does the patient have difficulty walking or climbing stairs?: Yes Weakness of Legs: None Weakness of Arms/Hands: None  Permission Sought/Granted         Permission granted to share info w AGENCY: Resources provided in discharge instructions        Emotional Assessment Appearance:: Appears stated age Attitude/Demeanor/Rapport: Gracious Affect (typically observed): Accepting Orientation: : Oriented to Self, Oriented to Place, Oriented to  Time, Oriented to Situation Alcohol / Substance Use: Tobacco Use Psych Involvement: No (comment)  Admission diagnosis:  Influenza A [J10.1] Hypoxia [R09.02] Acute respiratory failure with hypoxia ([place]) [J96.01] Patient Active Problem List   Diagnosis Date Noted   Acute respiratory failure with hypoxia ([place]) 09/29/2022   Influenza due to influenza A virus 09/29/2022   COPD with acute exacerbation ([place]) 12/08/2017   Heavy tobacco smoker >10  cigarettes per day 12/08/2017   Unintended weight loss 12/08/2017   Mixed hyperlipidemia 12/08/2017   Hypertension goal BP (blood pressure) < 130/80 12/08/2017   Prediabetes 12/08/2017   Throat cancer ([place]) 12/08/2017   History of cocaine abuse ([place]) 11/30/2017   History of alcohol abuse 11/30/2017   Refused influenza vaccine 11/29/2017   History of colon polyps    Olecranon bursitis of right elbow 02/03/2017   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   Subclinical hypothyroidism 05/06/2016   Muscle cramps 04/29/2016   Malignant neoplasm of larynx ([place]) 01/09/2016   Dysphonia 03/21/2015   Displaced comminuted fracture of shaft of right fibula, sequela 03/07/2015   Anxiety disorder 01/16/2014   Obesity 10/19/2012   Dry eye syndrome 06/10/2009   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   Vitamin D deficiency 05/28/2009   Sleep apnea 05/07/2008   History of hepatitis C 01/02/2003   Dyspnea 01/02/2003   PCP:  System, Provider Not In Pharmacy:   [id number] [place] [place] [address] [place]: [phone]: [phone] [place] [id number]: [phone]: [phone]    Social Determinants of Health ([place] Social [place]: [place] No Food Insecurity (09/29/2022)  Housing: Low Risk  (09/29/2022)  Transportation Needs: No Transportation Needs (09/29/2022)  Utilities: Not At Risk (09/29/2022)  Depression (PHQ2-9): Low Risk  (06/29/2022)  Tobacco Use: High Risk (09/29/2022)   SDOH Interventions:     Readmission Risk Interventions     No data to display

## 2022-10-01 ENCOUNTER — Other Ambulatory Visit (HOSPITAL_COMMUNITY): Payer: Self-pay

## 2022-10-01 LAB — GLUCOSE, CAPILLARY
Glucose-Capillary: 111 mg/dL — ABNORMAL HIGH (ref 70–99)
Glucose-Capillary: 143 mg/dL — ABNORMAL HIGH (ref 70–99)

## 2022-10-01 MED ORDER — FLUTICASONE FUROATE-VILANTEROL 100-25 MCG/ACT IN AEPB
1.0000 | INHALATION_SPRAY | Freq: Every day | RESPIRATORY_TRACT | 0 refills | Status: DC
  Filled 2022-10-01: qty 60, 30d supply, fill #0
  Filled 2022-10-01: qty 60, fill #0

## 2022-10-01 MED ORDER — UMECLIDINIUM BROMIDE 62.5 MCG/ACT IN AEPB: 1.0000 | INHALATION_SPRAY | Freq: Every day | RESPIRATORY_TRACT | 0 refills | Status: DC

## 2022-10-01 MED ORDER — UMECLIDINIUM BROMIDE 62.5 MCG/ACT IN AEPB
1.0000 | INHALATION_SPRAY | Freq: Every day | RESPIRATORY_TRACT | 0 refills | Status: DC
  Filled 2022-10-01: qty 30, 30d supply, fill #0

## 2022-10-01 MED ORDER — PREDNISONE 20 MG PO TABS
40.0000 mg | ORAL_TABLET | Freq: Every day | ORAL | 0 refills | Status: AC
  Filled 2022-10-01: qty 2, 1d supply, fill #0

## 2022-10-01 MED ORDER — ALBUTEROL SULFATE (2.5 MG/3ML) 0.083% IN NEBU
2.5000 mg | INHALATION_SOLUTION | Freq: Four times a day (QID) | RESPIRATORY_TRACT | 12 refills | Status: DC | PRN
  Filled 2022-10-01: qty 90, 8d supply, fill #0

## 2022-10-01 MED ORDER — OSELTAMIVIR PHOSPHATE 75 MG PO CAPS
75.0000 mg | ORAL_CAPSULE | Freq: Two times a day (BID) | ORAL | 0 refills | Status: AC
  Filled 2022-10-01: qty 4, 2d supply, fill #0

## 2022-10-01 NOTE — Discharge Summary (Signed)
Name: Zoe Snyder MRN: [id number] DOB: 01-29-1956 66 y.o. PCP: System, Provider Not In  Date of Admission: 09/28/2022  3:34 PM Date of Discharge: 10/01/2022 Attending Physician: Velna Ochs, MD  Discharge Diagnosis: Principal Problem:   Acute respiratory failure with hypoxia [facility]) Active Problems:   COPD with acute exacerbation ([place])   Influenza due to influenza A virus     Discharge Medications: Allergies as of 10/01/2022       Reactions   Epinephrine Anaphylaxis   Respiratory problems, e.g., wheezing; Palpitations  Respiratory problems, e.g., wheezing; Palpitations    Novocain [procaine] Palpitations, Other (See Comments)   Heart racing   Theraflu Severe Cold Daytime [dm-phenylephrine-acetaminophen] Palpitations        Medication List     TAKE these medications    albuterol (2.5 MG/3ML) 0.083% nebulizer solution Commonly known as: PROVENTIL Inhale 3 mLs (2.5 mg total) by nebulization every 6 (six) hours as needed for wheezing or shortness of breath.   amLODipine 10 MG tablet Commonly known as: NORVASC Take 1 tablet (10 mg total) by mouth daily. Due for follow up visit   aspirin EC 81 MG tablet Take 1 tablet by mouth daily.   atorvastatin 20 MG tablet Commonly known as: LIPITOR Take 20 mg by mouth at bedtime.   Baclofen 5 MG Tabs Take 1 tablet by mouth 2 (two) times daily.   Baclofen 5 MG Tabs Take 5 mg by mouth 2 (two) times daily as needed.   Breo Ellipta 100-25 MCG/ACT Aepb Generic drug: fluticasone furoate-vilanterol Inhale 1 puff into the lungs daily.   celecoxib 200 MG capsule Commonly known as: CELEBREX Take 200 mg by mouth daily.   cyclobenzaprine 10 MG tablet Commonly known as: FLEXERIL Take 10 mg by mouth 2 (two) times daily.   ipratropium 0.06 % nasal spray Commonly known as: ATROVENT Place 2 sprays into both nostrils 4 (four) times daily as needed for rhinitis.   Levothyroxine Sodium 50 MCG Caps See admin  instructions. TAKE 1/2 TABLET (37.5 MG) BY MOUTH EVERY MORNING ON an EMPTY stomach   meloxicam 7.5 MG tablet Commonly known as: Mobic Take 1 tablet (7.5 mg total) by mouth daily. What changed: how much to take   Multi-Vitamin tablet Take 1 tablet by mouth daily.   omeprazole 20 MG capsule Commonly known as: PRILOSEC Take by mouth.   oseltamivir 75 MG capsule Commonly known as: TAMIFLU Take 1 capsule (75 mg total) by mouth 2 (two) times daily for 2 days.   predniSONE 20 MG tablet Commonly known as: DELTASONE Take 2 tablets (40 mg total) by mouth daily with breakfast for 1 day. Start taking on: October 02, 2022   umeclidinium bromide 62.5 MCG/ACT Aepb Commonly known as: INCRUSE ELLIPTA Inhale 1 puff into the lungs daily. Start taking on: October 02, 2022   valsartan 80 MG tablet Commonly known as: DIOVAN Take 80 mg by mouth daily.   Vitamin D (Ergocalciferol) 1.25 MG (50000 UNIT) Caps capsule Commonly known as: DRISDOL Take 50,000 Units by mouth once a week.   Vitamin D-1000 Max St 25 MCG (1000 UT) tablet Generic drug: Cholecalciferol Take 1 tablet by mouth daily.         Disposition and follow-up:    Zoe Snyder was discharged from [facility] in Stable condition.  At the hospital follow up visit please address:   1.  Confirm that patient has completed her course of oseltamivir 12-23 and prednisone 12-22  2.  Ensure that  patient's symptoms including breath shortness has resolved and she can breath adequately without an oxygen requirement  3. Labs / imaging needed at time of follow-up: TSH and fT4 in 10-2022 to determine efficacy of current levothyroxine dose  4.  Pending labs / tests needing follow-up: none    Follow-up Appointments:  Follow-up [place] PCP office Follow up.   Why: Please schedule an appointment in the next 7-10 days after you are discharged home from the hospital.                 Hospital Course by problem list:  ---Acute hypoxic respiratory failure ---Chronic obstructive pulmonary disease exacerbation ---Influenza A infection ---Current tobacco use Patient has history of COPD without needing supplemental oxygen at home. She presented with upper respiratory symptoms and a new oxygen requirement of 2-3L/min. Upon arrival, vital signs were notable for Temp 103 and RR 26. Laboratory testing positive for influenza A and slightly low pCO2. Treatment with ipratropium-albuterol, umeclidinium, and prednisone was initiated for COPD exacerbation likely precipitated by influenza A infection. Five-day course of oseltamivir was started on 12-19. During ambulation, SpO2 values improved to 96-100 on room air. Outpatient medication regimen will consist of oseltamivir last dose 12-23 and prednisone 12-22.       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 Upon arrival, TSH 7.9 and fT4 0.48. Since euthyroid sick syndrome may explain these abnormalities, further diagnostic workup is warranted in outpatient setting including TSH and fT4 recheck around mid-January.    Discharge Exam:    BP (!) 144/74 (BP Location: Left Arm)   Pulse 66   Temp 98.2 F (36.8 C) (Oral)   Resp 20   Ht '5\' 4"'$  (1.626 m)   Wt 81.6 kg   SpO2 95%   BMI 30.90 kg/m   Subjective: Patient reports good this morning, better than yesterday. Still experiencing some mild symptoms, but ready to return home. Team answered her questions about medications.   General:                       awake and alert, lying in bed breathing oxygen at 2L/min, cooperative, not in acute distress Eyes:                            extraocular movements intact, conjunctivae pink, pupils round Lungs:                          normal respiratory effort, breathing unlabored,  symmetrical chest rise, diffuse crackles, no wheezing Cardiac:                        regular rate and rhythm, normal S1 and S2 Neurologic:                   oriented to person-place-time, moving all extremities, no gross focal deficits Psychiatric:                   euthymic mood with congruent affect, intelligible speech   Pertinent Labs, Studies, and Procedures:   Labs:     Latest Ref Rng & Units 09/30/2022    6:38 AM 09/29/2022   10:45 AM 09/28/2022    4:01 PM  BMP  Glucose 70 - 99 mg/dL 121   137   BUN 8 - 23 mg/dL 18   17   Creatinine 0.44 - 1.00 mg/dL 0.77   1.17   Sodium 135 - 145 mmol/L 138  136  136   Potassium 3.5 - 5.1 mmol/L 3.7  3.7  3.8   Chloride 98 - 111 mmol/L 104   101   CO2 22 - 32 mmol/L 24   25   Calcium 8.9 - 10.3 mg/dL 8.9   9.1       Latest Ref Rng & Units 09/30/2022    6:38 AM 09/29/2022   10:45 AM 09/28/2022    4:01 PM  CBC  WBC 4.0 - 10.5 K/uL 12.0   6.9   Hemoglobin 12.0 - 15.0 g/dL 12.9  14.3  14.4   Hematocrit 36.0 - 46.0 % 38.3  42.0  43.5   Platelets 150 - 400 K/uL 169   210      ______________________  Imaging:  No results found.   ______________________  Procedures:  none  ______________________   Discharge Instructions:  Discharge Instructions     Call MD for:  difficulty breathing, headache or visual disturbances   Complete by: As directed    Call MD for:  extreme fatigue   Complete by: As directed    Call MD for:  hives   Complete by: As directed    Call MD for:  persistant dizziness or light-headedness   Complete by: As directed    Call MD for:  persistant nausea and vomiting   Complete by: As directed    Call MD for:  redness, tenderness, or signs of infection (pain, swelling, redness, odor or green/yellow discharge around incision site)   Complete by: As directed    Call MD for:  severe uncontrolled pain   Complete by: As directed    Call MD for:  temperature >100.4   Complete by: As directed    Diet  - low sodium heart healthy   Complete by: As directed    Increase activity slowly   Complete by: As directed        Ms Winterhalter,  It was a pleasure taking care of you while you were in the hospital. Your breath shortness was caused by a viral infection called influenza A. Since you have underlying COPD, the viral infection worsened your breathing to the point where you needed supplemental oxygen. We gave you oxygen and some medications to help you breath including prednisone, which you will take for a few days at home. Additionally, we gave you oseltamivir to help your body clear the virus quicker. You will continue to take some of these breathing medications at home.  Please continue to take:  - Oseltamivir '75mg'$  twice daily until 12-23 - Prednisone '40mg'$  daily until 12-22 - Umeclidinium bromide 62.5ug daily - Fluticasone-vilanterol 100-25ug daily - Albuterol 2.'5mg'$  every four hours  When you run out of the oseltamivir and prednisone, you can stop taking these medications. You should follow up with your primary care provider who will decide whether or not to continue the breathing medications.  Please follow up with our internal medicine clinic. Your breathing responded well to the medications we gave you and you were breathing comfortably on room air upon discharge. If any of your symptoms including breath shortness return, then return to the emergency department.    Signed: [place] Nickel, MD Internal Medicine PGY-1 Pager [phone]

## 2022-10-01 NOTE — Discharge Instructions (Signed)
Zoe Snyder,  It was a pleasure taking care of you while you were in the hospital. Your breath shortness was caused by a viral infection called influenza A. Since you have underlying COPD, the viral infection worsened your breathing to the point where you needed supplemental oxygen. We gave you oxygen and some medications to help you breath including prednisone, which you will take for a few days at home. Additionally, we gave you oseltamivir to help your body clear the virus quicker. You will continue to take some of these breathing medications at home.  Please continue to take:  - Oseltamivir '75mg'$  twice daily - Umeclidinium bromide 62.5ug daily - Fluticasone-vilanterol 100-25ug daily - Albuterol 2.'5mg'$  every four hours - Prednisone '40mg'$  daily  When you run out of the oseltamivir and prednisone, you can stop taking these medications. You should follow up with your primary care provider who will decide whether or not to continue the breathing medications.  Please follow up with our internal medicine clinic. Your breathing responded well to the medications we gave you and you were breathing comfortably on room air upon discharge. If any of your symptoms including breath shortness return, then return to the emergency department.

## 2022-10-04 LAB — CULTURE, BLOOD (ROUTINE X 2)
Culture: NO GROWTH
Culture: NO GROWTH
Special Requests: ADEQUATE

## 2023-02-14 ENCOUNTER — Ambulatory Visit (HOSPITAL_COMMUNITY)
Admission: EM | Admit: 2023-02-14 | Discharge: 2023-02-14 | Disposition: A | Discharge status: Home / Self Care | Payer: 59 | Attending: Family Medicine | Admitting: Family Medicine

## 2023-02-14 ENCOUNTER — Encounter (HOSPITAL_COMMUNITY): Payer: Self-pay

## 2023-02-14 DIAGNOSIS — N3090 Cystitis, unspecified without hematuria: Secondary | ICD-10-CM | POA: Diagnosis not present

## 2023-02-14 DIAGNOSIS — M62838 Other muscle spasm: Secondary | ICD-10-CM | POA: Diagnosis not present

## 2023-02-14 LAB — BASIC METABOLIC PANEL
Anion gap: 10 (ref 5–15)
BUN: 13 mg/dL (ref 8–23)
CO2: 24 mmol/L (ref 22–32)
Calcium: 9.4 mg/dL (ref 8.9–10.3)
Chloride: 106 mmol/L (ref 98–111)
Creatinine, Ser: 0.71 mg/dL (ref 0.44–1.00)
GFR, Estimated: >60 mL/min (ref >60)
Glucose, Bld: 81 mg/dL (ref 70–99)
Potassium: 4.4 mmol/L (ref 3.5–5.1)
Sodium: 140 mmol/L (ref 135–145)

## 2023-02-14 LAB — POCT URINALYSIS DIP (MANUAL ENTRY)
Bilirubin, UA: NEGATIVE
Glucose, UA: 100 mg/dL — AB
Nitrite, UA: NEGATIVE
Protein Ur, POC: 30 mg/dL — AB
Spec Grav, UA: >=1.03 — AB (ref 1.010–1.025)
Urobilinogen, UA: 0.2 E.U./dL
pH, UA: 5.5 (ref 5.0–8.0)

## 2023-02-14 MED ORDER — CIPROFLOXACIN HCL 250 MG PO TABS: 250.0000 mg | ORAL_TABLET | Freq: Two times a day (BID) | ORAL | 0 refills | Status: AC

## 2023-02-14 NOTE — ED Provider Notes (Signed)
[facility]    CSN: [id number] Arrival date & time: 02/14/23  1008      History   Chief Complaint Chief Complaint  Patient presents with   Urinary Frequency    HPI Zoe Snyder is a 67 y.o. female.    Urinary Frequency   Here for urinary frequency and incomplete bladder emptying.  She is also had some burning and itching of her perineal area.  No fever.  She is also had some low back pain that she wonders if due to the urinary symptoms.  And she mentioned that she has been having a lot of muscle spasms in her back and legs and hands.   Past Medical History:  Diagnosis Date   COPD (chronic obstructive pulmonary disease) (HCC)    Hashimoto's thyroiditis    History of alcohol abuse 11/30/2017   History of cocaine abuse (HCC) 11/30/2017   History of colon polyps    Hyperlipidemia    Hypertension    Prediabetes    Throat cancer (HCC)    Tobacco use disorder     Patient Active Problem List   Diagnosis Date Noted   Acute respiratory failure with hypoxia (HCC) 09/29/2022   Influenza due to influenza A virus 09/29/2022   COPD with acute exacerbation (HCC) 12/08/2017   Heavy tobacco smoker >10 cigarettes per day 12/08/2017   Unintended weight loss 12/08/2017   Mixed hyperlipidemia 12/08/2017   Hypertension goal BP (blood pressure) < 130/80 12/08/2017   Prediabetes 12/08/2017   Throat cancer (HCC) 12/08/2017   History of cocaine abuse (HCC) 11/30/2017   History of alcohol abuse 11/30/2017   Refused influenza vaccine 11/29/2017   History of colon polyps    Olecranon bursitis of right elbow 02/03/2017   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   Subclinical hypothyroidism 05/06/2016   Muscle cramps 04/29/2016   Malignant neoplasm of larynx (HCC) 01/09/2016   Dysphonia 03/21/2015   Displaced comminuted fracture of shaft of right fibula, sequela 03/07/2015   Anxiety disorder 01/16/2014   Obesity 10/19/2012   Dry eye syndrome 06/10/2009   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      Home Medications    Prior to Admission medications   Medication Sig Start Date End Date Taking? Authorizing Provider  atorvastatin (LIPITOR) 20 MG tablet Take 20 mg by mouth at bedtime.   Yes [provider]  ciprofloxacin (CIPRO) 250 MG tablet Take 1 tablet (250 mg total) by mouth 2 (two) times daily for 7 days. 02/14/23 02/21/23 Yes Nalla Purdy, Janace Aris, MD  umeclidinium bromide (INCRUSE ELLIPTA) 62.5 MCG/ACT AEPB Inhale 1 puff into the lungs daily. 10/02/22  Yes Reymundo Poll, MD  valsartan (DIOVAN) 80 MG tablet Take 80 mg by mouth daily. 09/29/22  Yes [provider]  albuterol (PROVENTIL) (2.5 MG/3ML) 0.083% nebulizer solution Inhale 3 mLs (2.5 mg total) by nebulization every 6 (six) hours as needed for wheezing or shortness of breath. 10/01/22   Crissie Sickles, MD  amLODipine (NORVASC) 10 MG tablet Take 1 tablet (10 mg total) by mouth daily. Due for follow up visit 08/05/21   Pricilla Loveless, MD  aspirin EC 81 MG tablet Take 1  tablet by mouth daily. Patient not taking: Reported on 09/30/2022 01/07/17   [provider]  Baclofen 5 MG TABS Take 5 mg by mouth 2 (two) times daily as needed. 05/22/22   Raspet, Noberto Retort, PA-C  Baclofen 5 MG TABS Take 1 tablet by mouth 2 (two) times daily.    [provider]  celecoxib (CELEBREX) 200 MG capsule Take 200 mg by mouth daily. 08/05/22   [provider]  Cholecalciferol (VITAMIN D-1000 MAX ST) 1000 units tablet Take 1 tablet by mouth daily. Patient not taking: Reported on 09/30/2022 01/07/17   [provider]  cyclobenzaprine (FLEXERIL) 10 MG tablet Take 10 mg by mouth 2 (two) times daily.  08/05/22   [provider]  fluticasone furoate-vilanterol (BREO ELLIPTA) 100-25 MCG/ACT AEPB Inhale 1 puff into the lungs daily. 10/01/22   Crissie Sickles, MD  ipratropium (ATROVENT) 0.06 % nasal spray Place 2 sprays into both nostrils 4 (four) times daily as needed for rhinitis. Patient not taking: Reported on 09/30/2022 09/11/20   Georgetta Haber, NP  Levothyroxine Sodium 50 MCG CAPS See admin instructions. TAKE 1/2 TABLET (37.5 MG) BY MOUTH EVERY MORNING ON an EMPTY stomach 02/11/17   [provider]  meloxicam (MOBIC) 7.5 MG tablet Take 1 tablet (7.5 mg total) by mouth daily. Patient taking differently: Take 15 mg by mouth daily. 05/22/22   Raspet, Noberto Retort, PA-C  Multiple Vitamin (MULTI-VITAMIN) tablet Take 1 tablet by mouth daily. 05/08/16   [provider]  omeprazole (PRILOSEC) 20 MG capsule Take by mouth. Patient not taking: Reported on 09/30/2022 01/07/17   [provider]  Vitamin D, Ergocalciferol, (DRISDOL) 1.25 MG (50000 UNIT) CAPS capsule Take 50,000 Units by mouth once a week. Patient not taking: Reported on 09/30/2022 06/23/22   [provider]    Family History Family History  Problem Relation Age of Onset   Hypertension Mother    Diabetes Maternal Grandmother    Colon cancer Maternal Aunt     Social History Social History   Tobacco Use   Smoking status: Every Day    Packs/day: 0.50    Years: 45.00    Additional pack years: 0.00    Total pack years: 22.50    Types: Cigarettes   Smokeless tobacco: Never   Tobacco comments:    currently smoking 0.5 ppd, but she has cut down from over 1 ppd  Vaping Use   Vaping Use: Never used  Substance Use Topics   Alcohol use: Yes   Drug use: Yes    Types: Marijuana     Allergies   Epinephrine, Novocain [procaine], and Theraflu severe cold daytime [dm-phenylephrine-acetaminophen]   Review of Systems Review of Systems  Genitourinary:  Positive for frequency.     Physical  Exam Triage Vital Signs ED Triage Vitals  Enc Vitals Group     BP 02/14/23 1048 (!) 184/81     Pulse Rate 02/14/23 1048 74     Resp 02/14/23 1048 16     Temp 02/14/23 1048 98 F (36.7 C)     Temp Source 02/14/23 1048 Oral     SpO2 02/14/23 1048 94 %     Weight 02/14/23 1048 180 lb (81.6 kg)     Height 02/14/23 1048 5' 3.5" (1.613 m)     Head Circumference --      Peak Flow --      Pain Score 02/14/23 1047 0     Pain Loc --  Pain Edu? --      Excl. in GC? --    No data found.  Updated Vital Signs BP (!) 184/81 (BP Location: Left Arm)   Pulse 74   Temp 98 F (36.7 C) (Oral)   Resp 16   Ht 5' 3.5" (1.613 m)   Wt 81.6 kg   SpO2 94%   BMI 31.39 kg/m   Visual Acuity Right Eye Distance:   Left Eye Distance:   Bilateral Distance:    Right Eye Near:   Left Eye Near:    Bilateral Near:     Physical Exam Vitals reviewed.  HENT:     Mouth/Throat:     Mouth: Mucous membranes are moist.  Eyes:     Extraocular Movements: Extraocular movements intact.     Conjunctiva/sclera: Conjunctivae normal.     Pupils: Pupils are equal, round, and reactive to light.  Cardiovascular:     Rate and Rhythm: Normal rate and regular rhythm.     Heart sounds: No murmur heard. Pulmonary:     Effort: Pulmonary effort is normal.     Breath sounds: Normal breath sounds.  Abdominal:     General: There is no distension.     Palpations: Abdomen is soft.     Tenderness: There is abdominal tenderness (suprapubic). There is no right CVA tenderness or left CVA tenderness.  Musculoskeletal:     Cervical back: Neck supple.  Lymphadenopathy:     Cervical: No cervical adenopathy.  Skin:    Coloration: Skin is not jaundiced or pale.  Neurological:     General: No focal deficit present.     Mental Status: She is oriented to person, place, and time.  Psychiatric:        Behavior: Behavior normal.      UC Treatments / Results  Labs (all labs ordered are listed, but only abnormal results  are displayed) Labs Reviewed  POCT URINALYSIS DIP (MANUAL ENTRY) - Abnormal; Notable for the following components:      Result Value   Glucose, UA =100 (*)    Ketones, POC UA trace (5) (*)    Spec Grav, UA >=1.030 (*)    Blood, UA moderate (*)    Protein Ur, POC =30 (*)    Leukocytes, UA Small (1+) (*)    All other components within normal limits  URINE CULTURE  BASIC METABOLIC PANEL    EKG   Radiology No results found.  Procedures Procedures (including critical care time)  Medications Ordered in UC Medications - No data to display  Initial Impression / Assessment and Plan / UC Course  I have reviewed the triage vital signs and the nursing notes.  Pertinent labs & imaging results that were available during my care of the patient were reviewed by me and considered in my medical decision making (see chart for details).        Her urinalysis shows leukocytes, blood, a trace of ketones and 100 mg percent of sugar.  Going to treat for UTI with cipro and do a urine culture.  Also with all the muscle spasms, I am going to do a BMP, and staff will notify her if there is any significany abnormality, including any electrolyte abnormality. Final Clinical Impressions(s) / UC Diagnoses   Final diagnoses:  Cystitis  Muscle spasm     Discharge Instructions      The urinalysis showed some blood and white blood cells.  This is consistent with a urinary infection.  Take Cipro  250 mg--1 tablet 2 times daily for 7 days  We have sent the urine off for culture, and if it looks like you need a different antibiotic, staff will call you.  We have also drawn blood to check your electrolytes.  If anything is significantly abnormal, staff will call you.     ED Prescriptions     Medication Sig Dispense Auth. Provider   ciprofloxacin (CIPRO) 250 MG tablet Take 1 tablet (250 mg total) by mouth 2 (two) times daily for 7 days. 14 tablet Ahlaya Ende, Janace Aris, MD      PDMP not  reviewed this encounter.   Zenia Resides, MD 02/14/23 1120

## 2023-02-14 NOTE — ED Triage Notes (Signed)
Patient here today with c/o frequent urination X 6 days.She has also had some itching and burning in urination, and some discharge. Also c/o LB pain ache pain and abdominal discomfort. Has not tried anything.   Patient has a h/o LB pain and spasms X 1 month but her back has been bothering her more since she has been having the urinary symptoms.

## 2023-02-14 NOTE — Discharge Instructions (Signed)
The urinalysis showed some blood and white blood cells.  This is consistent with a urinary infection.  Take Cipro 250 mg--1 tablet 2 times daily for 7 days  We have sent the urine off for culture, and if it looks like you need a different antibiotic, staff will call you.  We have also drawn blood to check your electrolytes.  If anything is significantly abnormal, staff will call you.

## 2023-02-16 LAB — URINE CULTURE: Culture: 60000 — AB

## 2023-02-25 ENCOUNTER — Encounter (HOSPITAL_COMMUNITY): Payer: Self-pay | Admitting: Emergency Medicine

## 2023-02-25 ENCOUNTER — Ambulatory Visit (HOSPITAL_COMMUNITY)
Admission: EM | Admit: 2023-02-25 | Discharge: 2023-02-25 | Disposition: A | Discharge status: Home / Self Care | Payer: 59 | Attending: Internal Medicine | Admitting: Internal Medicine

## 2023-02-25 DIAGNOSIS — H60501 Unspecified acute noninfective otitis externa, right ear: Secondary | ICD-10-CM | POA: Diagnosis not present

## 2023-02-25 DIAGNOSIS — H6993 Unspecified Eustachian tube disorder, bilateral: Secondary | ICD-10-CM

## 2023-02-25 DIAGNOSIS — I10 Essential (primary) hypertension: Secondary | ICD-10-CM

## 2023-02-25 MED ORDER — OFLOXACIN 0.3 % OT SOLN: 10.0000 [drp] | Freq: Every day | OTIC | 0 refills | Status: AC

## 2023-02-25 NOTE — Discharge Instructions (Addendum)
You have an ear infection in your ear canal that is likely causing your symptoms of pain and itching.  Take antibiotic drops as prescribed.  Make sure you complete course of treatment.  Take Tylenol as needed for any discomfort.  If symptoms continue after treatment is complete please follow-up for further evaluation.  You would also benefit from using daily Flonase or generic fluticasone as needed and cetirizine daily as needed to help with fluid behind your ears.  Both of these medications can be purchased over-the-counter.  Your blood pressure reading was also elevated today.  Please follow-up with your primary care provider.  You will need to go to the ER should you experience any severe headache, dizziness, chest pain, weakness or slurred speech.

## 2023-02-25 NOTE — ED Triage Notes (Signed)
Pt c/o right ear itching for several days. Reports last night at worse was rubbing ear to help with itching and started having ringing and pain in right ear. Took advil earlier today.  Reports unable to go to work tonight due to pain in ear.  Hx throat cancer and polyp in throat.

## 2023-02-25 NOTE — ED Provider Notes (Signed)
[facility]    CSN: [id number] Arrival date & time: 02/25/23  1614      History   Chief Complaint Chief Complaint  Patient presents with   Otalgia    HPI Zoe Snyder is a 67 y.o. female presents urgent care today with complaints of itching and pain to right ear.  Patient reports chronic nasal congestion.  She denies any recent fever or chills, no cough or sore throat.    Past Medical History:  Diagnosis Date   COPD (chronic obstructive pulmonary disease) (HCC)    Hashimoto's thyroiditis    History of alcohol abuse 11/30/2017   History of cocaine abuse (HCC) 11/30/2017   History of colon polyps    Hyperlipidemia    Hypertension    Prediabetes    Throat cancer (HCC)    Tobacco use disorder     Patient Active Problem List   Diagnosis Date Noted   Acute respiratory failure with hypoxia (HCC) 09/29/2022   Influenza due to influenza A virus 09/29/2022   COPD with acute exacerbation (HCC) 12/08/2017   Heavy tobacco smoker >10 cigarettes per day 12/08/2017   Unintended weight loss 12/08/2017   Mixed hyperlipidemia 12/08/2017   Hypertension goal BP (blood pressure) < 130/80 12/08/2017   Prediabetes 12/08/2017   Throat cancer (HCC) 12/08/2017   History of cocaine abuse (HCC) 11/30/2017   History of alcohol abuse 11/30/2017   Refused influenza vaccine 11/29/2017   History of colon polyps    Olecranon bursitis of right elbow 02/03/2017   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   Subclinical hypothyroidism 05/06/2016   Muscle cramps 04/29/2016   Malignant neoplasm of larynx (HCC) 01/09/2016   Dysphonia 03/21/2015   Displaced comminuted fracture of shaft of right fibula, sequela 03/07/2015   Anxiety disorder 01/16/2014   Obesity 10/19/2012   Dry eye syndrome 06/10/2009   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   Vitamin D deficiency 05/28/2009   Sleep apnea 05/07/2008   History of hepatitis C 01/02/2003   Dyspnea 01/02/2003    Past Surgical History:  Procedure Laterality  Date   COLONOSCOPY W/ POLYPECTOMY  2016   THROAT SURGERY  2018   cancer    OB History   No obstetric history on file.      Home Medications    Prior to Admission medications   Medication Sig Start Date End Date Taking? Authorizing Provider  ofloxacin (FLOXIN) 0.3 % OTIC solution Place 10 drops into the left ear daily for 7 days. 02/25/23 03/04/23 Yes Rolla Etienne, NP  albuterol (PROVENTIL) (2.5 MG/3ML) 0.083% nebulizer solution Inhale 3 mLs (2.5 mg total) by nebulization every 6 (six) hours as needed for wheezing or shortness of breath. 10/01/22   Crissie Sickles, MD  amLODipine (NORVASC) 10 MG tablet Take 1 tablet (10 mg total) by mouth daily. Due for follow up visit 08/05/21   Pricilla Loveless, MD  aspirin EC 81 MG tablet Take 1 tablet by mouth daily. Patient not taking: Reported on 09/30/2022 01/07/17   [provider]  atorvastatin (LIPITOR) 20 MG tablet Take 20 mg by mouth at bedtime.    [provider]  Baclofen 5 MG TABS Take 5 mg by mouth 2 (two) times daily as needed. 05/22/22   Raspet, Noberto Retort, PA-C  Baclofen 5 MG TABS Take 1 tablet by mouth 2 (two) times daily.    [provider]  celecoxib (CELEBREX) 200 MG capsule Take 200 mg by mouth daily. 08/05/22   [provider]  Cholecalciferol (VITAMIN  D-1000 MAX ST) 1000 units tablet Take 1 tablet by mouth daily. Patient not taking: Reported on 09/30/2022 01/07/17   [provider]  cyclobenzaprine (FLEXERIL) 10 MG tablet Take 10 mg by mouth 2 (two) times daily. 08/05/22   [provider]  fluticasone furoate-vilanterol (BREO ELLIPTA) 100-25 MCG/ACT AEPB Inhale 1 puff into the lungs daily. 10/01/22   Crissie Sickles, MD  ipratropium (ATROVENT) 0.06 % nasal spray Place 2 sprays into both nostrils 4 (four) times daily as needed for rhinitis. Patient not taking: Reported on 09/30/2022 09/11/20   Georgetta Haber, NP  Levothyroxine Sodium 50 MCG CAPS See admin instructions. TAKE 1/2 TABLET  (37.5 MG) BY MOUTH EVERY MORNING ON an EMPTY stomach 02/11/17   [provider]  meloxicam (MOBIC) 7.5 MG tablet Take 1 tablet (7.5 mg total) by mouth daily. Patient taking differently: Take 15 mg by mouth daily. 05/22/22   Raspet, Noberto Retort, PA-C  Multiple Vitamin (MULTI-VITAMIN) tablet Take 1 tablet by mouth daily. 05/08/16   [provider]  omeprazole (PRILOSEC) 20 MG capsule Take by mouth. Patient not taking: Reported on 09/30/2022 01/07/17   [provider]  umeclidinium bromide (INCRUSE ELLIPTA) 62.5 MCG/ACT AEPB Inhale 1 puff into the lungs daily. 10/02/22   Reymundo Poll, MD  valsartan (DIOVAN) 80 MG tablet Take 80 mg by mouth daily. 09/29/22   [provider]  Vitamin D, Ergocalciferol, (DRISDOL) 1.25 MG (50000 UNIT) CAPS capsule Take 50,000 Units by mouth once a week. Patient not taking: Reported on 09/30/2022 06/23/22   [provider]    Family History Family History  Problem Relation Age of Onset   Hypertension Mother    Diabetes Maternal Grandmother    Colon cancer Maternal Aunt     Social History Social History   Tobacco Use   Smoking status: Every Day    Packs/day: 0.50    Years: 45.00    Additional pack years: 0.00    Total pack years: 22.50    Types: Cigarettes   Smokeless tobacco: Never   Tobacco comments:    currently smoking 0.5 ppd, but she has cut down from over 1 ppd  Vaping Use   Vaping Use: Never used  Substance Use Topics   Alcohol use: Yes   Drug use: Yes    Types: Marijuana     Allergies   Epinephrine, Novocain [procaine], and Theraflu severe cold daytime [dm-phenylephrine-acetaminophen]   Review of Systems As stated in HPI otherwise negative   Physical Exam Triage Vital Signs ED Triage Vitals  Enc Vitals Group     BP 02/25/23 1757 (!) 162/90     Pulse Rate 02/25/23 1757 77     Resp 02/25/23 1757 19     Temp 02/25/23 1757 98.1 F (36.7 C)     Temp Source 02/25/23 1757 Oral     SpO2  02/25/23 1757 96 %     Weight --      Height --      Head Circumference --      Peak Flow --      Pain Score 02/25/23 1754 10     Pain Loc --      Pain Edu? --      Excl. in GC? --    No data found.  Updated Vital Signs BP (!) 162/90 (BP Location: Left Arm)   Pulse 77   Temp 98.1 F (36.7 C) (Oral)   Resp 19   SpO2 96%   Visual Acuity Right  Eye Distance:   Left Eye Distance:   Bilateral Distance:    Right Eye Near:   Left Eye Near:    Bilateral Near:     Physical Exam Constitutional:      General: She is not in acute distress.    Appearance: Normal appearance. She is not ill-appearing or toxic-appearing.  HENT:     Ears:     Comments: TMs dull with air-fluid levels bilaterally, right ear canal with significant erythema.  No drainage.    Nose: Rhinorrhea present.     Mouth/Throat:     Mouth: Mucous membranes are moist.  Eyes:     Conjunctiva/sclera: Conjunctivae normal.  Neurological:     General: No focal deficit present.     Mental Status: She is alert and oriented to person, place, and time.  Psychiatric:        Mood and Affect: Mood normal.        Behavior: Behavior normal.      UC Treatments / Results  Labs (all labs ordered are listed, but only abnormal results are displayed) Labs Reviewed - No data to display  EKG   Radiology No results found.  Procedures Procedures (including critical care time)  Medications Ordered in UC Medications - No data to display  Initial Impression / Assessment and Plan / UC Course  I have reviewed the triage vital signs and the nursing notes.  Pertinent labs & imaging results that were available during my care of the patient were reviewed by me and considered in my medical decision making (see chart for details).  Acute otitis externa Eustachian tube dysfunction -Ofloxacin daily x 7 days.  Patient likely with chronic rhinitis and would benefit from Flonase and cetirizine as needed -Follow-up for persistent  or worsening symptoms  Patient has longstanding history of uncontrolled hypertension.  No current headache, dizziness, chest pain or weakness.  Continue previously prescribed medications and follow-up with PCP as already scheduled.  Strict ED precautions discussed  Reviewed expections re: course of current medical issues. Questions answered. Outlined signs and symptoms indicating need for more acute intervention. Pt verbalized understanding. AVS given  Final Clinical Impressions(s) / UC Diagnoses   Final diagnoses:  Acute otitis externa of right ear, unspecified type  Dysfunction of both eustachian tubes  Uncontrolled hypertension     Discharge Instructions      You have an ear infection in your ear canal that is likely causing your symptoms of pain and itching.  Take antibiotic drops as prescribed.  Make sure you complete course of treatment.  Take Tylenol as needed for any discomfort.  If symptoms continue after treatment is complete please follow-up for further evaluation.  You would also benefit from using daily Flonase or generic fluticasone as needed and cetirizine daily as needed to help with fluid behind your ears.  Both of these medications can be purchased over-the-counter.  Your blood pressure reading was also elevated today.  Please follow-up with your primary care provider.  You will need to go to the ER should you experience any severe headache, dizziness, chest pain, weakness or slurred speech.     ED Prescriptions     Medication Sig Dispense Auth. Provider   ofloxacin (FLOXIN) 0.3 % OTIC solution Place 10 drops into the left ear daily for 7 days. 10 mL Rolla Etienne, NP      PDMP not reviewed this encounter.   Rolla Etienne, NP 02/25/23 [phone]

## 2023-03-12 ENCOUNTER — Other Ambulatory Visit: Payer: Self-pay | Admitting: Family Medicine

## 2023-03-12 DIAGNOSIS — R2242 Localized swelling, mass and lump, left lower limb: Secondary | ICD-10-CM

## 2023-03-15 ENCOUNTER — Other Ambulatory Visit: Payer: Self-pay | Admitting: Family Medicine

## 2023-03-15 DIAGNOSIS — E2839 Other primary ovarian failure: Secondary | ICD-10-CM

## 2023-03-16 ENCOUNTER — Ambulatory Visit (INDEPENDENT_AMBULATORY_CARE_PROVIDER_SITE_OTHER): Payer: 59

## 2023-03-16 ENCOUNTER — Ambulatory Visit (HOSPITAL_COMMUNITY)
Admission: EM | Admit: 2023-03-16 | Discharge: 2023-03-16 | Disposition: A | Discharge status: Home / Self Care | Payer: 59 | Attending: Family Medicine | Admitting: Family Medicine

## 2023-03-16 ENCOUNTER — Other Ambulatory Visit: Payer: Self-pay

## 2023-03-16 ENCOUNTER — Encounter (HOSPITAL_COMMUNITY): Payer: Self-pay | Admitting: *Deleted

## 2023-03-16 DIAGNOSIS — J441 Chronic obstructive pulmonary disease with (acute) exacerbation: Secondary | ICD-10-CM | POA: Diagnosis not present

## 2023-03-16 DIAGNOSIS — R0602 Shortness of breath: Secondary | ICD-10-CM

## 2023-03-16 DIAGNOSIS — J22 Unspecified acute lower respiratory infection: Secondary | ICD-10-CM

## 2023-03-16 MED ORDER — CEFDINIR 300 MG PO CAPS: 300.0000 mg | ORAL_CAPSULE | Freq: Two times a day (BID) | ORAL | 0 refills | Status: AC

## 2023-03-16 MED ORDER — ALBUTEROL SULFATE (2.5 MG/3ML) 0.083% IN NEBU: 2.5000 mg | INHALATION_SOLUTION | Freq: Four times a day (QID) | RESPIRATORY_TRACT | 0 refills | Status: DC | PRN

## 2023-03-16 MED ORDER — IPRATROPIUM-ALBUTEROL 0.5-2.5 (3) MG/3ML IN SOLN
3.0000 mL | Freq: Once | RESPIRATORY_TRACT | Status: AC
  Administered 2023-03-16: 3 mL via RESPIRATORY_TRACT

## 2023-03-16 MED ORDER — PREDNISONE 20 MG PO TABS: 40.0000 mg | ORAL_TABLET | Freq: Every day | ORAL | 0 refills | Status: DC

## 2023-03-16 MED ORDER — IPRATROPIUM-ALBUTEROL 0.5-2.5 (3) MG/3ML IN SOLN
RESPIRATORY_TRACT | Status: AC
  Filled 2023-03-16: qty 3

## 2023-03-16 MED ORDER — ALBUTEROL SULFATE HFA 108 (90 BASE) MCG/ACT IN AERS: 2.0000 | INHALATION_SPRAY | RESPIRATORY_TRACT | 0 refills | Status: DC | PRN

## 2023-03-16 NOTE — Discharge Instructions (Addendum)
Start prednisone 40 mg once daily tomorrow morning with food.  Avoid taking prednisone in the evening as it will interfere with sleep.  Start your DuoNebs 1 nebulizer treatment every 6 hours.  You received 1 here in clinic today readminister another breathing treatment around 11 PM. I have prescribed an antibiotic for a lower respiratory infection-start cefdinir today 1 tablet twice daily for 10 days. If your shortness of breath worsens despite treatment prescribed go immediately to the emergency department.  X-ray shows no pneumonia only COPD

## 2023-03-16 NOTE — ED Provider Notes (Signed)
[facility]    CSN: [id number] Arrival date & time: 03/16/23  1645      History   Chief Complaint Chief Complaint  Patient presents with   Shortness of Breath    HPI Zoe Snyder is a 67 y.o. female.   HPI patient with a known history of COPD and throat cancer presents today with shortness of breath.  Patient endorses that worsening shortness of breath has been present for the last 4 days.  Patient reports that she does not follow-up with a pulmonologist but has a primary care through [facility].  She reports that she has been using an albuterol inhaler from her last visit here due to ED exacerbation however she has not managed on any chronic preventative inhaler therapies.  She reports that she is coughing up copious amount of sputum.  She denies any fever, chills.  On arrival her oxygen level was 94%.  She denies tightness, chest pain or bilateral lower extremity swelling any other symptoms.  Past Medical History:  Diagnosis Date   COPD (chronic obstructive pulmonary disease) (HCC)    Hashimoto's thyroiditis    History of alcohol abuse 11/30/2017   History of cocaine abuse (HCC) 11/30/2017   History of colon polyps    Hyperlipidemia    Hypertension    Prediabetes    Throat cancer (HCC)    Tobacco use disorder     Patient Active Problem List   Diagnosis Date Noted   Acute respiratory failure with hypoxia (HCC) 09/29/2022   Influenza due to influenza A virus 09/29/2022   COPD with acute exacerbation (HCC) 12/08/2017   Heavy tobacco smoker >10 cigarettes per day 12/08/2017   Unintended weight loss 12/08/2017   Mixed hyperlipidemia 12/08/2017   Hypertension goal BP (blood pressure) < 130/80 12/08/2017   Prediabetes 12/08/2017   Throat cancer (HCC) 12/08/2017   History of cocaine abuse (HCC) 11/30/2017   History of alcohol abuse 11/30/2017   Refused influenza vaccine 11/29/2017   History of colon polyps    Olecranon bursitis of right elbow  02/03/2017   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   Subclinical hypothyroidism 05/06/2016   Muscle cramps 04/29/2016   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      Home Medications    Prior to Admission medications   Medication Sig Start Date End Date Taking? Authorizing Provider  albuterol (VENTOLIN HFA) 108 (90 Base) MCG/ACT inhaler Inhale 2 puffs into the lungs every 4 (four) hours as needed for wheezing or shortness of breath. 03/16/23  Yes Bing Neighbors, NP  amLODipine (NORVASC) 10 MG tablet Take 1 tablet (10 mg total) by mouth daily. Due for follow up visit 08/05/21  Yes Pricilla Loveless, MD  Baclofen 5 MG TABS Take 1 tablet by mouth 2 (two) times daily.   Yes [provider]  cefdinir (OMNICEF) 300 MG capsule Take 1 capsule (300 mg total) by mouth 2 (two) times daily  for 10 days. 03/16/23 03/26/23 Yes Bing Neighbors, NP  Levothyroxine Sodium 50 MCG CAPS See admin instructions. TAKE 1/2 TABLET (37.5 MG) BY MOUTH EVERY MORNING ON an EMPTY stomach 02/11/17  Yes [provider]  predniSONE (DELTASONE) 20 MG tablet Take 2 tablets (40 mg total) by mouth daily with breakfast. 03/16/23  Yes Bing Neighbors, NP  valsartan (DIOVAN) 80 MG tablet Take 80 mg by mouth daily. 09/29/22  Yes [provider]  albuterol (PROVENTIL) (2.5 MG/3ML) 0.083% nebulizer solution Inhale 3 mLs (2.5 mg total) by nebulization every 6 (six) hours as needed for wheezing or shortness of breath.  03/16/23   Bing Neighbors, NP  aspirin EC 81 MG tablet Take 1 tablet by mouth daily. Patient not taking: Reported on 09/30/2022 01/07/17   [provider]  atorvastatin (LIPITOR) 20 MG tablet Take 20 mg by mouth at bedtime.    [provider]  Baclofen 5 MG TABS Take 5 mg by mouth 2 (two) times daily as needed. 05/22/22   Raspet, Noberto Retort, PA-C  celecoxib (CELEBREX) 200 MG capsule Take 200 mg by mouth daily. 08/05/22   [provider]  Cholecalciferol (VITAMIN D-1000 MAX ST) 1000 units tablet Take 1 tablet by mouth daily. Patient not taking: Reported on 09/30/2022 01/07/17   [provider]  cyclobenzaprine (FLEXERIL) 10 MG tablet Take 10 mg by mouth 2 (two) times daily. 08/05/22   [provider]  fluticasone furoate-vilanterol (BREO ELLIPTA) 100-25 MCG/ACT AEPB Inhale 1 puff into the lungs daily. 10/01/22   Crissie Sickles, MD  ipratropium (ATROVENT) 0.06 % nasal spray Place 2 sprays into both nostrils 4 (four) times daily as needed for rhinitis. Patient not taking: Reported on 09/30/2022 09/11/20   Linus Mako B, NP  meloxicam (MOBIC) 7.5 MG tablet Take 1 tablet (7.5 mg total) by mouth daily. Patient taking differently: Take 15 mg by mouth daily. 05/22/22   Raspet, Noberto Retort, PA-C  Multiple Vitamin (MULTI-VITAMIN) tablet Take 1 tablet by mouth daily. 05/08/16   [provider]  omeprazole (PRILOSEC) 20 MG capsule Take by mouth. Patient not taking: Reported on 09/30/2022 01/07/17   [provider]  umeclidinium bromide (INCRUSE ELLIPTA) 62.5 MCG/ACT AEPB Inhale 1 puff into the lungs daily. 10/02/22   Reymundo Poll, MD  Vitamin D, Ergocalciferol, (DRISDOL) 1.25 MG (50000 UNIT) CAPS capsule Take 50,000 Units by mouth once a week. Patient not taking: Reported on 09/30/2022 06/23/22   [provider]    Family History Family History  Problem Relation Age of Onset   Hypertension Mother    Diabetes Maternal Grandmother    Colon  cancer Maternal Aunt     Social History Social History   Tobacco Use   Smoking status: Every Day    Packs/day: 0.50    Years: 45.00    Additional pack years: 0.00    Total pack years: 22.50    Types: Cigarettes   Smokeless tobacco: Never   Tobacco comments:    currently smoking 0.5 ppd, but she has cut down from over 1 ppd  Vaping Use   Vaping Use: Never used  Substance Use Topics   Alcohol use: Yes   Drug use: Yes    Types: Marijuana     Allergies   Epinephrine, Novocain [procaine], and Theraflu severe cold daytime [dm-phenylephrine-acetaminophen]   Review of Systems Review of Systems Pertinent negatives listed in HPI  Physical Exam Triage Vital Signs ED Triage Vitals  Enc Vitals Group  BP 03/16/23 1657 (!) 156/113     Pulse Rate 03/16/23 1657 79     Resp 03/16/23 1657 18     Temp 03/16/23 1657 98.5 F (36.9 C)     Temp src --      SpO2 03/16/23 1657 94 %     Weight --      Height --      Head Circumference --      Peak Flow --      Pain Score 03/16/23 1658 0     Pain Loc --      Pain Edu? --      Excl. in GC? --    No data found.  Updated Vital Signs BP (!) 156/113   Pulse 79   Temp 98.5 F (36.9 C)   Resp 18   SpO2 94%   Visual Acuity Right Eye Distance:   Left Eye Distance:   Bilateral Distance:    Right Eye Near:   Left Eye Near:    Bilateral Near:     Physical Exam Vitals and nursing note reviewed.  Constitutional:      General: She is not in acute distress.    Appearance: She is well-developed. She is obese. She is not toxic-appearing.  HENT:     Head: Normocephalic and atraumatic.     Right Ear: A middle ear effusion is present.     Left Ear: Tympanic membrane, ear canal and external ear normal.     Nose: Nose normal.  Eyes:     Extraocular Movements: Extraocular movements intact.     Pupils: Pupils are equal, round, and reactive to light.  Cardiovascular:     Rate and Rhythm: Normal rate and regular rhythm.  Pulmonary:      Breath sounds: Decreased air movement present. Wheezing and rhonchi present.  Musculoskeletal:        General: Normal range of motion.     Cervical back: Normal range of motion.  Lymphadenopathy:     Cervical: No cervical adenopathy.  Neurological:     General: No focal deficit present.     Mental Status: She is alert.    UC Treatments / Results  Labs (all labs ordered are listed, but only abnormal results are displayed) Labs Reviewed - No data to display  EKG   Radiology DG Chest 2 View  Result Date: 03/16/2023 CLINICAL DATA:  Shortness of breath.  History of pneumonia and COPD. EXAM: CHEST - 2 VIEW COMPARISON:  09/28/2022 FINDINGS: Emphysematous changes in the lungs. Heart size and pulmonary vascularity are normal. No airspace disease or consolidation in the lungs. No pleural effusions. No pneumothorax. Tortuous aorta. IMPRESSION: Emphysematous changes in the lungs. No evidence of active pulmonary disease. Electronically Signed   By: Burman Nieves M.D.   On: 03/16/2023 17:57    Procedures Procedures (including critical care time)  Medications Ordered in UC Medications  ipratropium-albuterol (DUONEB) 0.5-2.5 (3) MG/3ML nebulizer solution 3 mL (3 mLs Nebulization Given 03/16/23 1750)    Initial Impression / Assessment and Plan / UC Course  I have reviewed the triage vital signs and the nursing notes.  Pertinent labs & imaging results that were available during my care of the patient were reviewed by me and considered in my medical decision making (see chart for details).    Shortness of breath secondary to COPD exacerbation.  Concern also for lower respiratory infection chest x-ray is negative for acute pneumonia, cefdinir 300 mg twice daily x 10 days.  Treating with prednisone 40 mg once daily with food.  Patient prescribed a nebulizer machine for home use which was dispensed here from urgent care.  Prescribed ampules of albuterol to be used every 6 hours as needed for  wheezing or shortness of breath.  Patient was also prescribed a new albuterol inhaler. Final Clinical Impressions(s) / UC Diagnoses   Final diagnoses:  Shortness of breath  COPD exacerbation (HCC)  Lower respiratory infection (e.g., bronchitis, pneumonia, pneumonitis, pulmonitis)     Discharge Instructions      Start prednisone 40 mg once daily tomorrow morning with food.  Avoid taking prednisone in the evening as it will interfere with sleep.  Start your DuoNebs 1 nebulizer treatment every 6 hours.  You received 1 here in clinic today readminister another breathing treatment around 11 PM. I have prescribed an antibiotic for a lower respiratory infection-start cefdinir today 1 tablet twice daily for 10 days. If your shortness of breath worsens despite treatment prescribed go immediately to the emergency department.  X-ray shows no pneumonia only COPD     ED Prescriptions     Medication Sig Dispense Auth. Provider   albuterol (PROVENTIL) (2.5 MG/3ML) 0.083% nebulizer solution Inhale 3 mLs (2.5 mg total) by nebulization every 6 (six) hours as needed for wheezing or shortness of breath. 270 mL Bing Neighbors, NP   albuterol (VENTOLIN HFA) 108 (90 Base) MCG/ACT inhaler Inhale 2 puffs into the lungs every 4 (four) hours as needed for wheezing or shortness of breath. 8 g Bing Neighbors, NP   predniSONE (DELTASONE) 20 MG tablet Take 2 tablets (40 mg total) by mouth daily with breakfast. 10 tablet Bing Neighbors, NP   cefdinir (OMNICEF) 300 MG capsule Take 1 capsule (300 mg total) by mouth 2 (two) times daily for 10 days. 20 capsule Bing Neighbors, NP      PDMP not reviewed this encounter.   Bing Neighbors, NP 03/16/23 [phone]

## 2023-03-16 NOTE — ED Notes (Signed)
Encouraged patient to go to pharmacy tonight and get her medicines that have been prescribed by our provider.

## 2023-03-24 ENCOUNTER — Ambulatory Visit
Admission: RE | Admit: 2023-03-24 | Discharge: 2023-03-24 | Disposition: A | Discharge status: Home / Self Care | Payer: 59 | Source: Ambulatory Visit | Attending: Family Medicine | Admitting: Family Medicine

## 2023-03-24 DIAGNOSIS — E2839 Other primary ovarian failure: Secondary | ICD-10-CM

## 2023-03-25 ENCOUNTER — Ambulatory Visit
Admission: RE | Admit: 2023-03-25 | Discharge: 2023-03-25 | Disposition: A | Discharge status: Home / Self Care | Payer: 59 | Source: Ambulatory Visit | Attending: Family Medicine | Admitting: Family Medicine

## 2023-03-25 DIAGNOSIS — R2242 Localized swelling, mass and lump, left lower limb: Secondary | ICD-10-CM

## 2023-08-13 ENCOUNTER — Ambulatory Visit (HOSPITAL_COMMUNITY)
Admission: EM | Admit: 2023-08-13 | Discharge: 2023-08-13 | Disposition: A | Discharge status: Home / Self Care | Payer: 59 | Attending: Physician Assistant | Admitting: Physician Assistant

## 2023-08-13 ENCOUNTER — Encounter (HOSPITAL_COMMUNITY): Payer: Self-pay

## 2023-08-13 DIAGNOSIS — K2900 Acute gastritis without bleeding: Secondary | ICD-10-CM | POA: Insufficient documentation

## 2023-08-13 DIAGNOSIS — R1084 Generalized abdominal pain: Secondary | ICD-10-CM | POA: Diagnosis present

## 2023-08-13 LAB — COMPREHENSIVE METABOLIC PANEL
ALT: 16 U/L (ref 0–44)
AST: 17 U/L (ref 15–41)
Albumin: 3.6 g/dL (ref 3.5–5.0)
Alkaline Phosphatase: 73 U/L (ref 38–126)
Anion gap: 10 (ref 5–15)
BUN: 26 mg/dL — ABNORMAL HIGH (ref 8–23)
CO2: 21 mmol/L — ABNORMAL LOW (ref 22–32)
Calcium: 8.9 mg/dL (ref 8.9–10.3)
Chloride: 106 mmol/L (ref 98–111)
Creatinine, Ser: 0.97 mg/dL (ref 0.44–1.00)
GFR, Estimated: >60 mL/min (ref >60)
Glucose, Bld: 126 mg/dL — ABNORMAL HIGH (ref 70–99)
Potassium: 4.8 mmol/L (ref 3.5–5.1)
Sodium: 137 mmol/L (ref 135–145)
Total Bilirubin: 0.7 mg/dL (ref 0.3–1.2)
Total Protein: 6.4 g/dL — ABNORMAL LOW (ref 6.5–8.1)

## 2023-08-13 LAB — CBC WITH DIFFERENTIAL/PLATELET
Abs Immature Granulocytes: 0.04 10*3/uL (ref 0.00–0.07)
Basophils Absolute: 0.1 10*3/uL (ref 0.0–0.1)
Basophils Relative: 1 %
Eosinophils Absolute: 0.3 10*3/uL (ref 0.0–0.5)
Eosinophils Relative: 3 %
HCT: 40.6 % (ref 36.0–46.0)
Hemoglobin: 13.5 g/dL (ref 12.0–15.0)
Immature Granulocytes: 0 %
Lymphocytes Relative: 19 %
Lymphs Abs: 1.8 10*3/uL (ref 0.7–4.0)
MCH: 31.1 pg (ref 26.0–34.0)
MCHC: 33.3 g/dL (ref 30.0–36.0)
MCV: 93.5 fL (ref 80.0–100.0)
Monocytes Absolute: 0.8 10*3/uL (ref 0.1–1.0)
Monocytes Relative: 9 %
Neutro Abs: 6.1 10*3/uL (ref 1.7–7.7)
Neutrophils Relative %: 68 %
Platelets: 256 10*3/uL (ref 150–400)
RBC: 4.34 MIL/uL (ref 3.87–5.11)
RDW: 13.4 % (ref 11.5–15.5)
WBC: 9 10*3/uL (ref 4.0–10.5)
nRBC: 0 % (ref 0.0–0.2)

## 2023-08-13 LAB — LIPASE, BLOOD: Lipase: 30 U/L (ref 11–51)

## 2023-08-13 MED ORDER — PANTOPRAZOLE SODIUM 20 MG PO TBEC: 20.0000 mg | DELAYED_RELEASE_TABLET | Freq: Every day | ORAL | 1 refills | Status: DC

## 2023-08-13 MED ORDER — VITAMIN D-1000 MAX ST 25 MCG (1000 UT) PO TABS: 1000.0000 [IU] | ORAL_TABLET | Freq: Every day | ORAL | 0 refills | Status: AC

## 2023-08-13 NOTE — ED Triage Notes (Signed)
Pt c/o LUQ pain intermittent for the past 3 months.  Pt c/o diarrhea for months. States when she coughs she is incontinent with stool. States ate a chocolate cupcake yesterday and stools were black today.  Pt c/o congestion and COPD. States using her neb tx's with some relief.

## 2023-08-13 NOTE — ED Provider Notes (Signed)
[facility]    CSN: [id number] Arrival date & time: 08/13/23  2952      History   Chief Complaint Chief Complaint  Patient presents with   Abdominal Pain    HPI Zoe Snyder is a 67 y.o. female.   Patient complains of left upper quadrant abdominal pain patient reports she frequently has bloating and she feels like she has a bubble under her ribs on her left side.  Patient reports that she has not had any chest pain.  Patient reports that she frequently has diarrhea.  Patient reports that she has high blood pressure and elevated cholesterol.  Patient has been diagnosed with degenerative disc disease in her low back.  Patient is currently struggling with symptoms of sciatica.  Patient also has a past medical history of throat cancer.  Patient denies any fever or chills she has not had any nausea or vomiting.  The abdominal pain comes and goes.  Patient is currently followed by [facility]  Patient is concerned that she could have coronary artery disease she is concerned that she could have peripheral vascular disease.  The history is provided by the patient. No language interpreter was used.  Abdominal Pain   Past Medical History:  Diagnosis Date   COPD (chronic obstructive pulmonary disease) (HCC)    Hashimoto's thyroiditis    History of alcohol abuse 11/30/2017   History of cocaine abuse (HCC) 11/30/2017   History of colon polyps    Hyperlipidemia    Hypertension    Prediabetes    Throat cancer (HCC)    Tobacco use disorder     Patient Active Problem List   Diagnosis Date Noted   Acute respiratory failure with hypoxia (HCC) 09/29/2022   Influenza due to influenza A virus 09/29/2022   COPD with acute exacerbation (HCC) 12/08/2017   Heavy tobacco smoker >10 cigarettes per day 12/08/2017   Unintended weight loss 12/08/2017   Mixed hyperlipidemia 12/08/2017   Hypertension goal BP (blood pressure) < 130/80 12/08/2017   Prediabetes 12/08/2017   Throat  cancer (HCC) 12/08/2017   History of cocaine abuse (HCC) 11/30/2017   History of alcohol abuse 11/30/2017   Refused influenza vaccine 11/29/2017   History of colon polyps    Olecranon bursitis of right elbow 02/03/2017   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   Subclinical hypothyroidism 05/06/2016   Muscle cramps 04/29/2016   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      Home Medications    Prior to Admission medications   Medication Sig Start Date End Date Taking? Authorizing Provider  pantoprazole (PROTONIX) 20 MG tablet Take 1 tablet (20 mg total) by mouth daily. 08/13/23 08/12/24 Yes Cheron Schaumann K, PA-C  albuterol (PROVENTIL) (2.5 MG/3ML) 0.083% nebulizer solution Inhale 3 mLs (2.5 mg total) by nebulization every 6 (six) hours as needed for wheezing or shortness of breath. 03/16/23   Bing Neighbors, NP  albuterol (VENTOLIN HFA)  108 (90 Base) MCG/ACT inhaler Inhale 2 puffs into the lungs every 4 (four) hours as needed for wheezing or shortness of breath. 03/16/23   Bing Neighbors, NP  amLODipine (NORVASC) 10 MG tablet Take 1 tablet (10 mg total) by mouth daily. Due for follow up visit 08/05/21   Pricilla Loveless, MD  aspirin EC 81 MG tablet Take 1 tablet by mouth daily. Patient not taking: Reported on 09/30/2022 01/07/17   [provider]  atorvastatin (LIPITOR) 20 MG tablet Take 20 mg by mouth at bedtime.    [provider]  Baclofen 5 MG TABS Take 5 mg by mouth 2  (two) times daily as needed. 05/22/22   Raspet, Noberto Retort, PA-C  Baclofen 5 MG TABS Take 1 tablet by mouth 2 (two) times daily.    [provider]  celecoxib (CELEBREX) 200 MG capsule Take 200 mg by mouth daily. 08/05/22   [provider]  Cholecalciferol (VITAMIN D-1000 MAX ST) 1000 units tablet Take 1 tablet by mouth daily. Patient not taking: Reported on 09/30/2022 01/07/17   [provider]  cyclobenzaprine (FLEXERIL) 10 MG tablet Take 10 mg by mouth 2 (two) times daily. 08/05/22   [provider]  fluticasone furoate-vilanterol (BREO ELLIPTA) 100-25 MCG/ACT AEPB Inhale 1 puff into the lungs daily. 10/01/22   Crissie Sickles, MD  ipratropium (ATROVENT) 0.06 % nasal spray Place 2 sprays into both nostrils 4 (four) times daily as needed for rhinitis. Patient not taking: Reported on 09/30/2022 09/11/20   Georgetta Haber, NP  Levothyroxine Sodium 50 MCG CAPS See admin instructions. TAKE 1/2 TABLET (37.5 MG) BY MOUTH EVERY MORNING ON an EMPTY stomach 02/11/17   [provider]  meloxicam (MOBIC) 7.5 MG tablet Take 1 tablet (7.5 mg total) by mouth daily. Patient taking differently: Take 15 mg by mouth daily. 05/22/22   Raspet, Noberto Retort, PA-C  Multiple Vitamin (MULTI-VITAMIN) tablet Take 1 tablet by mouth daily. 05/08/16   [provider]  predniSONE (DELTASONE) 20 MG tablet Take 2 tablets (40 mg total) by mouth daily with breakfast. 03/16/23   Bing Neighbors, NP  umeclidinium bromide (INCRUSE ELLIPTA) 62.5 MCG/ACT AEPB Inhale 1 puff into the lungs daily. 10/02/22   Reymundo Poll, MD  valsartan (DIOVAN) 80 MG tablet Take 80 mg by mouth daily. 09/29/22   [provider]  Vitamin D, Ergocalciferol, (DRISDOL) 1.25 MG (50000 UNIT) CAPS capsule Take 50,000 Units by mouth once a week. Patient not taking: Reported on 09/30/2022 06/23/22   [provider]    Family History Family History  Problem Relation Age of Onset   Hypertension Mother     Diabetes Maternal Grandmother    Colon cancer Maternal Aunt     Social History Social History   Tobacco Use   Smoking status: Every Day    Current packs/day: 0.50    Average packs/day: 0.5 packs/day for 45.0 years (22.5 ttl pk-yrs)    Types: Cigarettes   Smokeless tobacco: Never   Tobacco comments:    currently smoking 0.5 ppd, but she has cut down from over 1 ppd  Vaping Use   Vaping status: Never Used  Substance Use Topics   Alcohol use: Not Currently   Drug use: Not Currently    Types: Marijuana     Allergies   Epinephrine, Novocain [procaine], and Theraflu severe cold daytime [dm-phenylephrine-acetaminophen]   Review of Systems Review of Systems  Gastrointestinal:  Positive for abdominal pain.  All other systems reviewed and are  negative.    Physical Exam Triage Vital Signs ED Triage Vitals [08/13/23 0834]  Encounter Vitals Group     BP (!) 185/123     Systolic BP Percentile      Diastolic BP Percentile      Pulse Rate 79     Resp 20     Temp 98.2 F (36.8 C)     Temp Source Oral     SpO2 96 %     Weight      Height      Head Circumference      Peak Flow      Pain Score 5     Pain Loc      Pain Education      Exclude from Growth Chart    No data found.  Updated Vital Signs BP (!) 185/123 (BP Location: Right Arm)   Pulse 79   Temp 98.2 F (36.8 C) (Oral)   Resp 20   SpO2 96%   Visual Acuity Right Eye Distance:   Left Eye Distance:   Bilateral Distance:    Right Eye Near:   Left Eye Near:    Bilateral Near:     Physical Exam Vitals and nursing note reviewed.  Constitutional:      Appearance: She is well-developed.  HENT:     Head: Normocephalic.  Cardiovascular:     Rate and Rhythm: Normal rate.  Pulmonary:     Effort: Pulmonary effort is normal.  Abdominal:     General: Abdomen is flat. Bowel sounds are normal. There is no distension.     Palpations: Abdomen is soft.     Tenderness: There is no abdominal tenderness.   Musculoskeletal:        General: Normal range of motion.     Cervical back: Normal range of motion.  Skin:    General: Skin is warm.  Neurological:     General: No focal deficit present.     Mental Status: She is alert and oriented to person, place, and time.      UC Treatments / Results  Labs (all labs ordered are listed, but only abnormal results are displayed) Labs Reviewed  CBC WITH DIFFERENTIAL/PLATELET  COMPREHENSIVE METABOLIC PANEL  LIPASE, BLOOD    EKG   Radiology No results found.  Procedures Procedures (including critical care time)  Medications Ordered in UC Medications - No data to display  Initial Impression / Assessment and Plan / UC Course  I have reviewed the triage vital signs and the nursing notes.  Pertinent labs & imaging results that were available during my care of the patient were reviewed by me and considered in my medical decision making (see chart for details).     Medical decision making: Patient has multiple areas of concern.  The primary reason that she is coming today is due to abdominal bloating sensation and feeling like she has a bubble in her left upper quadrant.  I will send CBC c-Met and lipase.  EKG is obtained and shows a normal sinus rhythm acute ST changes [place] 408. I will prescribe the patient Protonix to see if this helps with her symptoms.  Patient is counseled on diet management. Patient expresses concern that she could have a abdominal or a thoracic aneurysm.  I was able to review a CT scan from 22 of patient's abdomen and a CT scan from 23 of patient's chest there were no obvious aneurysms.  I discussed with patient her concerns about coronary artery  disease she is advised to discuss this with her primary care physician.  He can decide if she should be referred to cardiology or have a coronary CT.  I suspect patient is at high risk for coronary artery disease.  Patient is advised to continue her blood pressure medicine and her  cholesterol medicine. Patient asked if I would prescribe her vitamin D as she has run out of her vitamin D and cannot get her clinic to prescribe Final Clinical Impressions(s) / UC Diagnoses   Final diagnoses:  Generalized abdominal pain  Acute gastritis without hemorrhage, unspecified gastritis type     Discharge Instructions      Follow up with your Physician for recheck. Try medications prescribed for symptoms    ED Prescriptions     Medication Sig Dispense Auth. Provider   pantoprazole (PROTONIX) 20 MG tablet Take 1 tablet (20 mg total) by mouth daily. 30 tablet Elson Areas, [place]      PDMP not reviewed this encounter.  An After Visit Summary was printed and given to the patient.       Elson Areas, [place] 08/13/23 1646

## 2023-08-13 NOTE — Discharge Instructions (Addendum)
Follow up with your Physician for recheck. Try medications prescribed for symptoms

## 2023-08-25 IMAGING — CT CT CHEST LUNG CANCER SCREENING LOW DOSE W/O CM
1 series · 10 of 10 positions shown, 13 images · non-contrast
Comparison: CT abdomen and pelvis dated January 18, 2021

CLINICAL DATA: Current smoker with 49 pack-year history



[ct lung segmentation data · axial · 0.78mm/px · z∈[-368,-368]mm · 10 of 332 frames shown]
[frame 1/332  mediastinal]
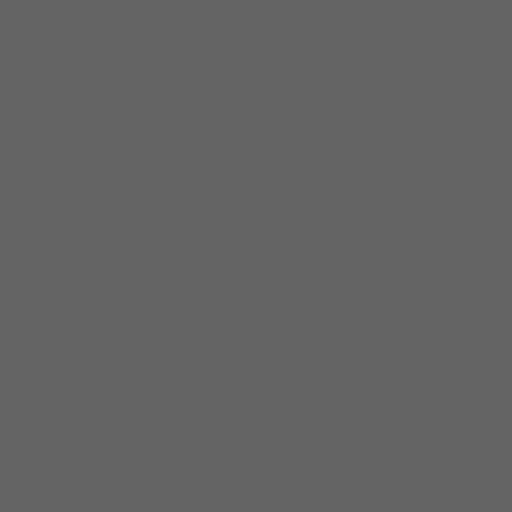
[frame 1/332  lung]
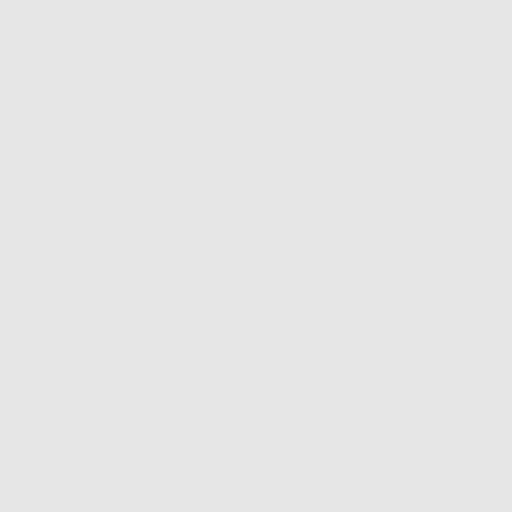
[frame 37/332  lung]
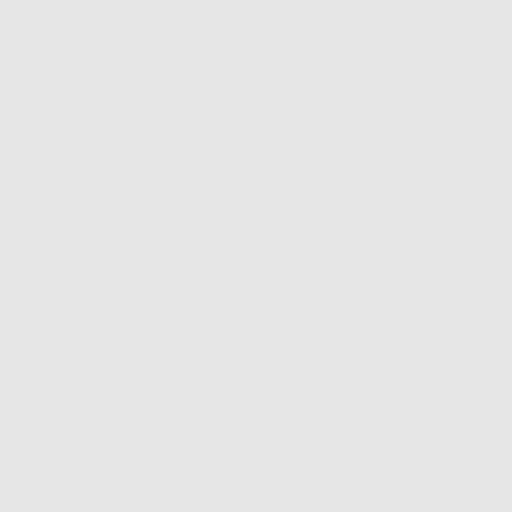
[frame 74/332  lung]
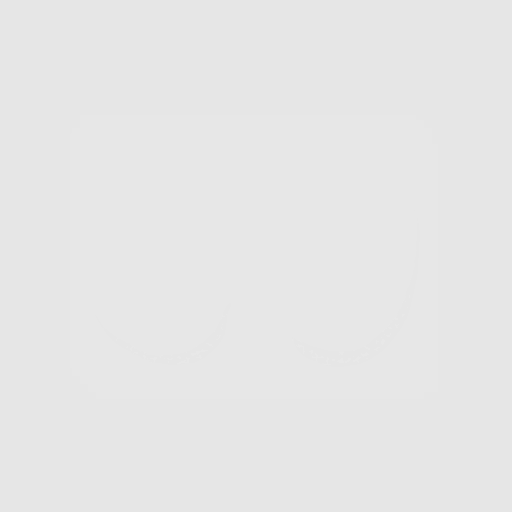
[frame 111/332  lung]
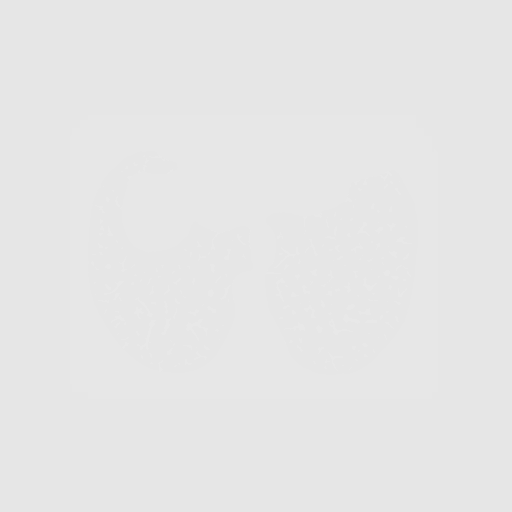
[frame 148/332  mediastinal]
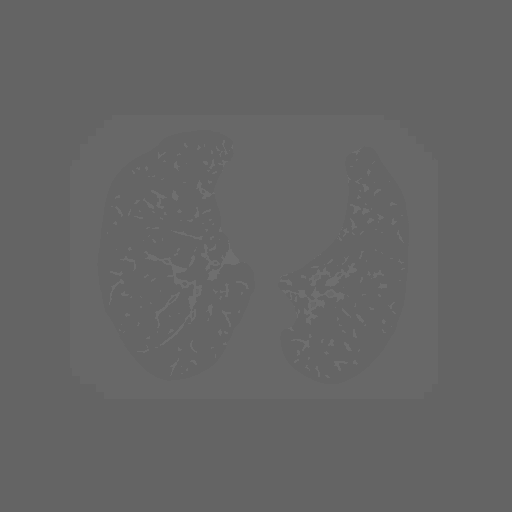
[frame 148/332  lung]
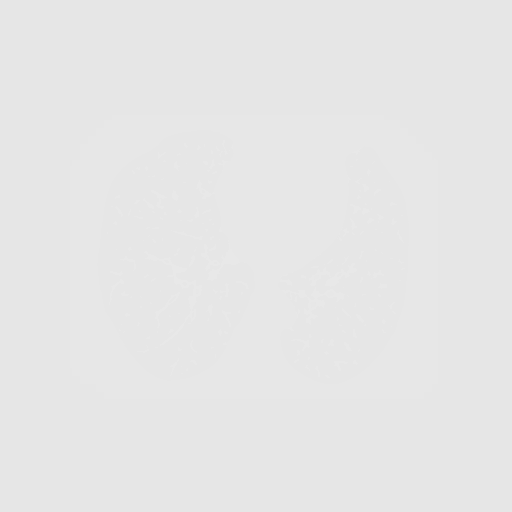
[frame 184/332  lung]
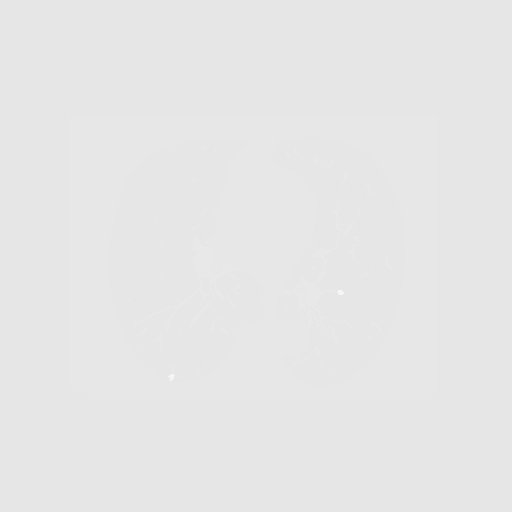
[frame 221/332  lung]
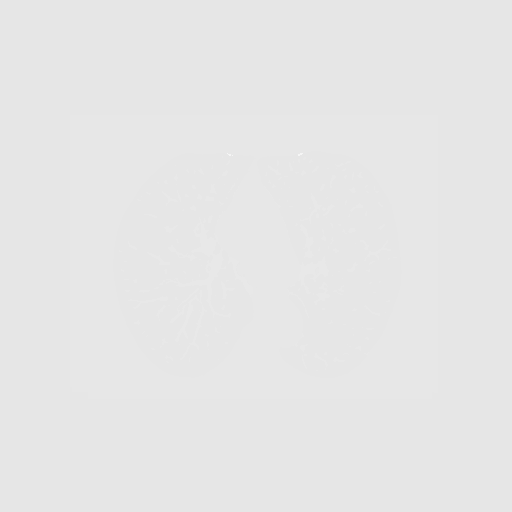
[frame 258/332  lung]
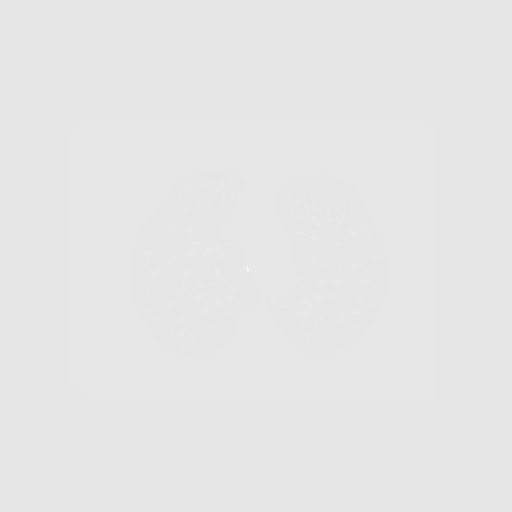
[frame 295/332  mediastinal]
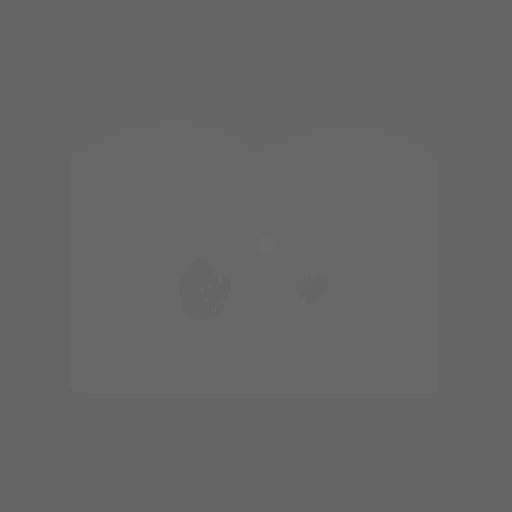
[frame 295/332  lung]
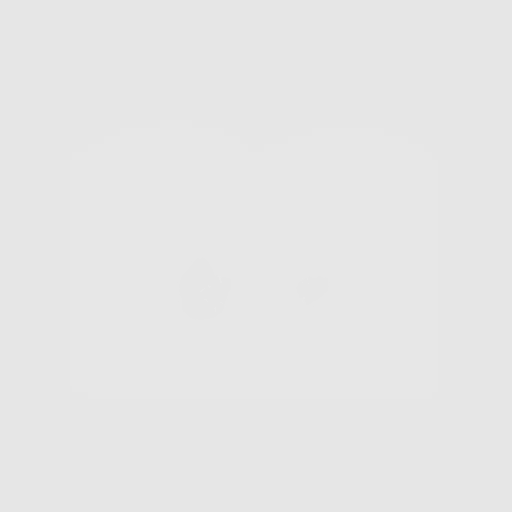
[frame 332/332  lung]
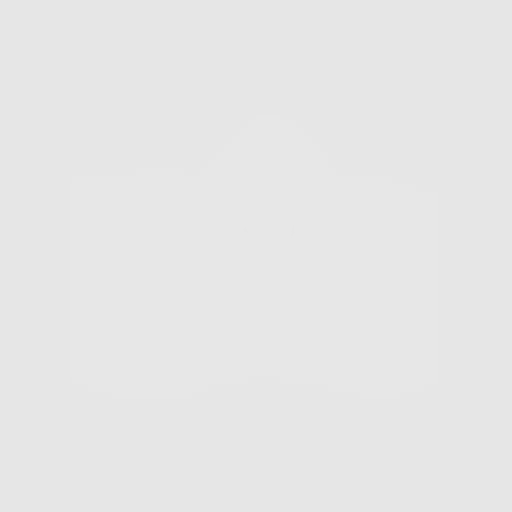

[10 of 10 positions shown; findings below may reference images not displayed]

FINDINGS: Cardiovascular: Normal heart size. No pericardial effusion. Mild
coronary artery calcifications of the left main and LAD. Mitral
annular calcifications. Normal caliber thoracic aorta with moderate
atherosclerotic disease.

Mediastinum/Nodes: Small to moderate hiatal hernia. Thyroid is
unremarkable. No enlarged lymph nodes seen in the chest.

Lungs/Pleura: Central airways are patent. Centrilobular emphysema.
Innumerable small ground-glass pulmonary nodules are seen throughout
the lungs, but most pronounced in the lower lungs. Reference nodule
of the right lower lobe measures 4.8 mm in mean diameter on image
117. A larger ground-glass nodule is also seen in the right lower
lobe measuring 13 mm in mean diameter on image 157. Small bilateral
solid pulmonary nodules are seen, reference nodule of the left upper
lobe measuring 2.4 mm in mean diameter on image 73.

Upper Abdomen: No acute abnormality.

Musculoskeletal: No chest wall mass or suspicious bone lesions
identified.
IMPRESSION: 1. Lung-RADS 2, benign appearance or behavior. Continue annual
screening with low-dose chest CT without contrast in 12 months.
2. Innumerable small ground-glass pulmonary nodules which are most
pronounced in the lower lungs, possibly sequela of prior infection,
similar findings were seen on prior CT dated January 18, 2021.
3. Aortic Atherosclerosis (LSREV-9AI.I) and Emphysema (LSREV-CP3.1).

## 2023-09-15 ENCOUNTER — Encounter (HOSPITAL_COMMUNITY): Payer: Self-pay

## 2023-09-15 ENCOUNTER — Ambulatory Visit (HOSPITAL_COMMUNITY)
Admission: EM | Admit: 2023-09-15 | Discharge: 2023-09-15 | Disposition: A | Discharge status: Home / Self Care | Payer: 59 | Attending: Emergency Medicine | Admitting: Emergency Medicine

## 2023-09-15 DIAGNOSIS — R197 Diarrhea, unspecified: Secondary | ICD-10-CM | POA: Insufficient documentation

## 2023-09-15 DIAGNOSIS — R1012 Left upper quadrant pain: Secondary | ICD-10-CM | POA: Insufficient documentation

## 2023-09-15 DIAGNOSIS — R1084 Generalized abdominal pain: Secondary | ICD-10-CM | POA: Diagnosis not present

## 2023-09-15 DIAGNOSIS — R112 Nausea with vomiting, unspecified: Secondary | ICD-10-CM | POA: Insufficient documentation

## 2023-09-15 LAB — POCT URINALYSIS DIP (MANUAL ENTRY)
Bilirubin, UA: NEGATIVE
Glucose, UA: NEGATIVE mg/dL
Ketones, POC UA: NEGATIVE mg/dL
Nitrite, UA: NEGATIVE
Protein Ur, POC: NEGATIVE mg/dL
Spec Grav, UA: 1.02 (ref 1.010–1.025)
Urobilinogen, UA: 0.2 U/dL
pH, UA: 5.5 (ref 5.0–8.0)

## 2023-09-15 LAB — POCT FASTING CBG KUC MANUAL ENTRY: POCT Glucose (KUC): 135 mg/dL — AB (ref 70–99)

## 2023-09-15 MED ORDER — ONDANSETRON 8 MG PO TBDP: 8.0000 mg | ORAL_TABLET | Freq: Three times a day (TID) | ORAL | 0 refills | Status: DC | PRN

## 2023-09-15 MED ORDER — FAMOTIDINE 20 MG PO TABS: 20.0000 mg | ORAL_TABLET | Freq: Two times a day (BID) | ORAL | 0 refills | Status: DC

## 2023-09-15 MED ORDER — ONDANSETRON 4 MG PO TBDP
4.0000 mg | ORAL_TABLET | Freq: Once | ORAL | Status: AC
  Administered 2023-09-15: 4 mg via ORAL

## 2023-09-15 MED ORDER — ONDANSETRON 4 MG PO TBDP
ORAL_TABLET | ORAL | Status: AC
  Filled 2023-09-15: qty 1

## 2023-09-15 NOTE — ED Triage Notes (Signed)
Patient here today with c/o abd pain, fever, nausea, vomiting, headache, and weakness X 1 week. No sick contacts.

## 2023-09-15 NOTE — ED Provider Notes (Signed)
[facility]    CSN: [id number] Arrival date & time: 09/15/23  4098      History   Chief Complaint Chief Complaint  Patient presents with   Abdominal Pain    HPI Zoe Snyder is a 67 y.o. female.   Patient presents to clinic for multiple complaints.  She is having generalized abdominal pain, worse in the left upper quadrant.  Had a fever last week.  She has been having nausea and vomiting since last Friday as well as a headache.  She is also felt fatigued and off for the past week. Non-productive cough as well, this is ongoing.  She has chronic back pain and this is hurting her.  She also has sciatica, reports this is flared up.  She is having ongoing issue with having diarrhea every time she coughs, she does have a colonoscopy scheduled soon to examine this.  Last week when she had her subjective fevers she did take a Tylenol.  Over the past week she has been in bed and has been drinking soup.  Reports she has been unable to hold any food or fluids down for the past week, she did vomit prior to arrival.  Describes it as watery. Every time she eats she gags. Was taking the Protonix from UC visit last month but this hurt her stomach so she stopped.   Discusses that she has issues with transportation.  The person the normally takes her to work is out of town and she has not felt well so she has missed work for the past few days.         The history is provided by the patient and medical records.  Abdominal Pain   Past Medical History:  Diagnosis Date   COPD (chronic obstructive pulmonary disease) (HCC)    Hashimoto's thyroiditis    History of alcohol abuse 11/30/2017   History of cocaine abuse (HCC) 11/30/2017   History of colon polyps    Hyperlipidemia    Hypertension    Prediabetes    Throat cancer (HCC)    Tobacco use disorder     Patient Active Problem List   Diagnosis Date Noted   Acute respiratory failure with hypoxia (HCC) 09/29/2022    Influenza due to influenza A virus 09/29/2022   COPD with acute exacerbation (HCC) 12/08/2017   Heavy tobacco smoker >10 cigarettes per day 12/08/2017   Unintended weight loss 12/08/2017   Mixed hyperlipidemia 12/08/2017   Hypertension goal BP (blood pressure) < 130/80 12/08/2017   Prediabetes 12/08/2017   Throat cancer (HCC) 12/08/2017   History of cocaine abuse (HCC) 11/30/2017   History of alcohol abuse 11/30/2017   Refused influenza vaccine 11/29/2017   History of colon polyps    Olecranon bursitis of right elbow 02/03/2017   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   Subclinical hypothyroidism 05/06/2016   Muscle cramps 04/29/2016   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  Home Medications    Prior to Admission medications   Medication Sig Start Date End Date Taking? Authorizing Provider  famotidine (PEPCID) 20 MG tablet Take 1 tablet (20 mg total) by mouth 2 (two) times daily. 09/15/23  Yes Rinaldo Ratel, [place] N, FNP  levothyroxine (SYNTHROID) 75 MCG tablet Take 75 mcg by mouth every morning. 06/21/23  Yes [provider]  ondansetron (ZOFRAN-ODT) 8 MG disintegrating tablet Take 1 tablet (8 mg total) by mouth every 8 (eight) hours as needed. 09/15/23  Yes Rinaldo Ratel, [place] N, FNP  albuterol (PROVENTIL) (2.5 MG/3ML) 0.083% nebulizer solution Inhale 3 mLs (2.5 mg total) by nebulization every 6 (six) hours as needed for wheezing or shortness of breath. 03/16/23   Bing Neighbors,  NP  albuterol (VENTOLIN HFA) 108 (90 Base) MCG/ACT inhaler Inhale 2 puffs into the lungs every 4 (four) hours as needed for wheezing or shortness of breath. 03/16/23   Bing Neighbors, NP  amLODipine (NORVASC) 10 MG tablet Take 1 tablet (10 mg total) by mouth daily. Due for follow up visit 08/05/21   Pricilla Loveless, MD  atorvastatin (LIPITOR) 20 MG tablet Take 20 mg by mouth at bedtime.    [provider]  Baclofen 5 MG TABS Take 1 tablet by mouth 2 (two) times daily.    [provider]  celecoxib (CELEBREX) 200 MG capsule Take 200 mg by mouth daily. 08/05/22   [provider]  Cholecalciferol (VITAMIN D-1000 MAX ST) 25 MCG (1000 UT) tablet Take 1 tablet (1,000 Units total) by mouth daily. 08/13/23   Elson Areas, PA-C  cyclobenzaprine (FLEXERIL) 10 MG tablet Take 10 mg by mouth 2 (two) times daily. 08/05/22   [provider]  fluticasone furoate-vilanterol (BREO ELLIPTA) 100-25 MCG/ACT AEPB Inhale 1 puff into the lungs daily. 10/01/22   Crissie Sickles, MD  meloxicam (MOBIC) 7.5 MG tablet Take 1 tablet (7.5 mg total) by mouth daily. Patient taking differently: Take 15 mg by mouth daily. 05/22/22   Raspet, Noberto Retort, PA-C  Multiple Vitamin (MULTI-VITAMIN) tablet Take 1 tablet by mouth daily. 05/08/16   [provider]  umeclidinium bromide (INCRUSE ELLIPTA) 62.5 MCG/ACT AEPB Inhale 1 puff into the lungs daily. 10/02/22   Reymundo Poll, MD  valsartan (DIOVAN) 80 MG tablet Take 80 mg by mouth daily. 09/29/22   [provider]    Family History Family History  Problem Relation Age of Onset   Hypertension Mother    Diabetes Maternal Grandmother    Colon cancer Maternal Aunt     Social History Social History   Tobacco Use   Smoking status: Every Day    Current packs/day: 0.50    Average packs/day: 0.5 packs/day for 45.0 years (22.5 ttl pk-yrs)    Types: Cigarettes   Smokeless tobacco: Never   Tobacco comments:    currently smoking  0.5 ppd, but she has cut down from over 1 ppd  Vaping Use   Vaping status: Never Used  Substance Use Topics   Alcohol use: Not Currently   Drug use: Not Currently    Types: Marijuana     Allergies   Epinephrine, Novocain [procaine], and Theraflu severe cold daytime [dm-phenylephrine-acetaminophen]   Review of Systems Review of Systems  Per HPI   Physical Exam Triage Vital Signs ED Triage Vitals  Encounter Vitals Group     BP 09/15/23 0832 (!) 178/108     Systolic BP Percentile --      Diastolic BP Percentile --  Pulse Rate 09/15/23 0832 83     Resp 09/15/23 0832 16     Temp 09/15/23 0832 99.1 F (37.3 C)     Temp Source 09/15/23 0832 Oral     SpO2 09/15/23 0832 95 %     Weight 09/15/23 0831 190 lb (86.2 kg)     Height 09/15/23 0831 5\' 3"  (1.6 m)     Head Circumference --      Peak Flow --      Pain Score 09/15/23 0831 9     Pain Loc --      Pain Education --      Exclude from Growth Chart --    No data found.  Updated Vital Signs BP (!) 178/108 (BP Location: Right Arm)   Pulse 83   Temp 99.1 F (37.3 C) (Oral)   Resp 16   Ht 5\' 3"  (1.6 m)   Wt 190 lb (86.2 kg)   SpO2 95%   BMI 33.66 kg/m   Visual Acuity Right Eye Distance:   Left Eye Distance:   Bilateral Distance:    Right Eye Near:   Left Eye Near:    Bilateral Near:     Physical Exam Vitals and nursing note reviewed.  Constitutional:      Appearance: She is well-developed.  HENT:     Head: Normocephalic and atraumatic.     Mouth/Throat:     Mouth: Mucous membranes are moist.  Cardiovascular:     Rate and Rhythm: Normal rate and regular rhythm.     Heart sounds: Normal heart sounds. No murmur heard. Pulmonary:     Effort: Pulmonary effort is normal. No respiratory distress.  Abdominal:     General: Abdomen is flat. Bowel sounds are normal.     Palpations: Abdomen is soft.     Tenderness: There is generalized abdominal tenderness and tenderness in the left upper quadrant.      Hernia: No hernia is present.  Skin:    General: Skin is warm and dry.  Neurological:     General: No focal deficit present.     Mental Status: She is alert.  Psychiatric:        Mood and Affect: Mood normal.      UC Treatments / Results  Labs (all labs ordered are listed, but only abnormal results are displayed) Labs Reviewed  POCT FASTING CBG [facility] MANUAL ENTRY - Abnormal; Notable for the following components:      Result Value   POCT Glucose ([facility]) 135 (*)    All other components within normal limits  POCT URINALYSIS DIP (MANUAL ENTRY) - Abnormal; Notable for the following components:   Blood, UA trace-intact (*)    Leukocytes, UA Small (1+) (*)    All other components within normal limits  URINE CULTURE    EKG   Radiology No results found.  Procedures Procedures (including critical care time)  Medications Ordered in UC Medications  ondansetron (ZOFRAN-ODT) disintegrating tablet 4 mg (4 mg Oral Given 09/15/23 0906)    Initial Impression / Assessment and Plan / UC Course  I have reviewed the triage vital signs and the nursing notes.  Pertinent labs & imaging results that were available during my care of the patient were reviewed by me and considered in my medical decision making (see chart for details).  Vitals and triage reviewed, patient is hemodynamically stable.  Lungs are vesicular, heart with regular rate and rhythm.  Abdomen is soft with active bowel sounds, generalized tenderness,  worse in left upper quadrant.  UA shows some red blood cells and leukocytes, denies any dysuria.  Does have low back pain, this is chronic.  Will send UA off for culture to ensure no urinary tract infection.  Nausea improved with ODT Zofran.  UA does not show any signs or symptoms of dehydration, appears patient has been able to keep some fluids down.  Encouraged oral rehydration.  Left upper quadrant pain may be due to acid reflux, she did not do well on the PPI, will trial H2 blocker.   Plan of care, follow-up care and return precautions given, no questions at this time.  Work note given.      Final Clinical Impressions(s) / UC Diagnoses   Final diagnoses:  Generalized abdominal pain  Nausea vomiting and diarrhea     Discharge Instructions      Take the nausea medication as needed.  Encourage oral rehydration with fluids like water and ginger ale.  Your urine have some abnormalities, we are sending this off for culture and we will contact you if antibiotics are needed.  Start the Pepcid twice daily with meals.  If your symptoms are due to acid reflux I suggest decreasing intake of acidic foods like tomatoes, eating smaller more frequent meals and sitting upright afterwards.  As Urgent Care providers, we only only can evaluate you for an episodic event; and this cannot be substituted for the continued care and monitoring by your primary care provider and/or specialist. It is not unusual that a medical condition can present itself in one way, then progress or change and lead to another impression of your medical condition. If you should have any new or worsening symptoms, please go to the closest emergency department and contact your primary physician as soon as possible for further evaluation and testing.       ED Prescriptions     Medication Sig Dispense Auth. Provider   famotidine (PEPCID) 20 MG tablet Take 1 tablet (20 mg total) by mouth 2 (two) times daily. 30 tablet Rinaldo Ratel, [place] N, FNP   ondansetron (ZOFRAN-ODT) 8 MG disintegrating tablet Take 1 tablet (8 mg total) by mouth every 8 (eight) hours as needed. 20 tablet Javontae Marlette, [place] N, [place]      PDMP not reviewed this encounter.   Rinaldo Ratel [place] N, [place] 09/15/23 [phone]

## 2023-09-15 NOTE — Discharge Instructions (Addendum)
Take the nausea medication as needed.  Encourage oral rehydration with fluids like water and ginger ale.  Your urine have some abnormalities, we are sending this off for culture and we will contact you if antibiotics are needed.  Start the Pepcid twice daily with meals.  If your symptoms are due to acid reflux I suggest decreasing intake of acidic foods like tomatoes, eating smaller more frequent meals and sitting upright afterwards.  As Urgent Care providers, we only only can evaluate you for an episodic event; and this cannot be substituted for the continued care and monitoring by your primary care provider and/or specialist. It is not unusual that a medical condition can present itself in one way, then progress or change and lead to another impression of your medical condition. If you should have any new or worsening symptoms, please go to the closest emergency department and contact your primary physician as soon as possible for further evaluation and testing.

## 2023-09-16 LAB — URINE CULTURE: Culture: <10000 — AB

## 2023-10-22 ENCOUNTER — Encounter (HOSPITAL_COMMUNITY): Payer: Self-pay

## 2023-10-22 ENCOUNTER — Ambulatory Visit (INDEPENDENT_AMBULATORY_CARE_PROVIDER_SITE_OTHER): Payer: 59

## 2023-10-22 ENCOUNTER — Ambulatory Visit (HOSPITAL_COMMUNITY)
Admission: EM | Admit: 2023-10-22 | Discharge: 2023-10-22 | Disposition: A | Discharge status: Home / Self Care | Payer: 59 | Attending: Emergency Medicine | Admitting: Emergency Medicine

## 2023-10-22 DIAGNOSIS — J441 Chronic obstructive pulmonary disease with (acute) exacerbation: Secondary | ICD-10-CM

## 2023-10-22 DIAGNOSIS — Z8709 Personal history of other diseases of the respiratory system: Secondary | ICD-10-CM

## 2023-10-22 DIAGNOSIS — R0602 Shortness of breath: Secondary | ICD-10-CM | POA: Diagnosis not present

## 2023-10-22 LAB — POCT INFLUENZA A/B
Influenza A, POC: NEGATIVE
Influenza B, POC: NEGATIVE

## 2023-10-22 LAB — POC RSV: RSV Antigen, POC: NEGATIVE

## 2023-10-22 MED ORDER — IPRATROPIUM-ALBUTEROL 0.5-2.5 (3) MG/3ML IN SOLN: 3.0000 mL | RESPIRATORY_TRACT | 1 refills | Status: DC | PRN

## 2023-10-22 MED ORDER — AZITHROMYCIN 250 MG PO TABS: ORAL_TABLET | ORAL | 0 refills | Status: AC

## 2023-10-22 MED ORDER — IPRATROPIUM-ALBUTEROL 0.5-2.5 (3) MG/3ML IN SOLN
RESPIRATORY_TRACT | Status: AC
  Filled 2023-10-22: qty 3

## 2023-10-22 MED ORDER — METHYLPREDNISOLONE 4 MG PO TBPK: ORAL_TABLET | ORAL | 0 refills | Status: DC

## 2023-10-22 MED ORDER — PROMETHAZINE-DM 6.25-15 MG/5ML PO SYRP: 5.0000 mL | ORAL_SOLUTION | Freq: Every evening | ORAL | 0 refills | Status: DC | PRN

## 2023-10-22 MED ORDER — IPRATROPIUM-ALBUTEROL 0.5-2.5 (3) MG/3ML IN SOLN
3.0000 mL | Freq: Once | RESPIRATORY_TRACT | Status: AC
  Administered 2023-10-22: 3 mL via RESPIRATORY_TRACT

## 2023-10-22 NOTE — Discharge Instructions (Addendum)
 Your rapid influenza and RSV antigen test today was negative.  No further influenza testing is indicated.   Please read below to learn more about the medications, dosages and frequencies that I recommend to help alleviate your symptoms and to get you feeling better soon:   Z-Pak (azithromycin ):  Please take two (2) tablets on day one and one tablet daily thereafter until the prescription is complete.This antibiotic can cause upset stomach, this will resolve once antibiotics are complete.  You are welcome to take a probiotic, eat yogurt, take Imodium while taking this medication.  Please avoid other systemic medications such as Maalox, Pepto-Bismol or milk of magnesia as they can interfere with the body's ability to absorb the antibiotics.       DuoNeb (ipratropium and albuterol ): This inhaled medication contains a short acting beta agonist bronchodilator and a muscarinic bronchodilator.  This medication works on the smooth muscle that opens and constricts of your airways by relaxing the muscle.  The result of relaxation of the smooth muscle is increased air movement and improved work of breathing.  This is a short acting medication that can be used every 4-6 hours as needed for increased work of breathing, shortness of breath, wheezing and excessive coughing.  For the next 2 weeks, please use this medication instead of your albuterol .   Promethazine  DM: Promethazine  is both a nasal decongestant that dries up mucous membranes and an antinausea medication.  Promethazine  often makes most patients feel fairly sleepy.  DM is dextromethorphan, a single symptom reliever which is a cough suppressant found in many over-the-counter cough medications and combination cold preparations.  Please take 5 mL before bedtime to minimize your cough which will help you sleep better.  I have sent a prescription for this medication to your pharmacy because it cannot be purchased over-the-counter.   If symptoms have not  meaningfully improved in the next 5 to 7 days, please return for repeat evaluation or follow-up with your regular provider.  If symptoms have worsened in the next 3 to 5 days, please return for repeat evaluation or follow-up with your regular provider.    Thank you for visiting urgent care today.  We appreciate the opportunity to participate in your care.

## 2023-10-22 NOTE — ED Provider Notes (Addendum)
 [facility]    CSN: [id number] Arrival date & time: 10/22/23  9176    HISTORY   Chief Complaint  Patient presents with   Cough   HPI Zoe Snyder is a pleasant, 68 y.o. female who presents to urgent care today. Patient complains of a 3-day history of shortness of breath and nonproductive cough.  Patient has a history of COPD, pneumonia and laryngeal polyp concerning for cancer.  Patient states she continues to smoke cigarettes but much less than she used to.  Patient has a history ARDS as well.  Patient states she has been using her albuterol  nebs at home without meaningful relief.  Patient states she uses Breo and Incruse every day as prescribed.  Patient has a mildly elevated temperature on arrival, is mildly tachypneic and has an SpO2 of 91%.  Patient appears to be in no acute distress, speaking in complete sentences like about her medical history during our visit.  The history is provided by the patient.   Past Medical History:  Diagnosis Date   COPD (chronic obstructive pulmonary disease) (HCC)    Hashimoto's thyroiditis    History of alcohol abuse 11/30/2017   History of cocaine abuse (HCC) 11/30/2017   History of colon polyps    Hyperlipidemia    Hypertension    Prediabetes    Throat cancer (HCC)    Tobacco use disorder    Patient Active Problem List   Diagnosis Date Noted   Acute respiratory failure with hypoxia (HCC) 09/29/2022   Influenza due to influenza A virus 09/29/2022   COPD with acute exacerbation (HCC) 12/08/2017   Heavy tobacco smoker >10 cigarettes per day 12/08/2017   Unintended weight loss 12/08/2017   Mixed hyperlipidemia 12/08/2017   Hypertension goal BP (blood pressure) < 130/80 12/08/2017   Prediabetes 12/08/2017   Throat cancer (HCC) 12/08/2017   History of cocaine abuse (HCC) 11/30/2017   History of alcohol abuse 11/30/2017   Refused influenza vaccine 11/29/2017   History of colon polyps    Olecranon bursitis of right elbow  02/03/2017   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   Subclinical hypothyroidism 05/06/2016   Muscle cramps 04/29/2016   Malignant neoplasm of larynx (HCC) 01/09/2016   Dysphonia 03/21/2015   Displaced comminuted fracture of shaft of right fibula, sequela 03/07/2015   Anxiety disorder 01/16/2014   Obesity 10/19/2012   Dry eye syndrome 06/10/2009   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    Home Medications    Prior to Admission medications   Medication Sig Start Date End Date Taking? Authorizing Provider  albuterol  (PROVENTIL ) (2.5 MG/3ML) 0.083% nebulizer solution Inhale 3 mLs (2.5 mg total) by nebulization every 6 (six) hours as needed for wheezing or shortness of breath. 03/16/23   Arloa Suzen RAMAN, NP  albuterol  (VENTOLIN  HFA) 108 (90 Base) MCG/ACT inhaler Inhale 2 puffs into the lungs every 4 (four) hours as needed for wheezing or shortness of breath. 03/16/23   Arloa Suzen RAMAN, NP  amLODipine  (NORVASC ) 10 MG tablet Take 1 tablet (10 mg total) by mouth daily. Due for follow up visit 08/05/21   Freddi Hamilton, MD  atorvastatin  (LIPITOR) 20 MG tablet Take 20 mg  by mouth at bedtime.    [provider]  Baclofen  5 MG TABS Take 1 tablet by mouth 2 (two) times daily.    [provider]  celecoxib (CELEBREX) 200 MG capsule Take 200 mg by mouth daily. 08/05/22   [provider]  Cholecalciferol  (VITAMIN D -1000 MAX ST) 25 MCG (1000 UT) tablet Take 1 tablet (1,000 Units total) by mouth daily. 08/13/23   Sofia, Leslie K, PA-C  cyclobenzaprine (FLEXERIL) 10 MG tablet Take 10 mg by mouth 2 (two) times daily. 08/05/22   [provider]  famotidine  (PEPCID ) 20 MG tablet Take 1 tablet (20 mg total) by  mouth 2 (two) times daily. 09/15/23   Dreama, [place]  N, FNP  fluticasone  furoate-vilanterol (BREO ELLIPTA ) 100-25 MCG/ACT AEPB Inhale 1 puff into the lungs daily. 10/01/22   Rudy Sieving, MD  levothyroxine  (SYNTHROID ) 75 MCG tablet Take 75 mcg by mouth every morning. 06/21/23   [provider]  meloxicam  (MOBIC ) 7.5 MG tablet Take 1 tablet (7.5 mg total) by mouth daily. Patient taking differently: Take 15 mg by mouth daily. 05/22/22   Raspet, Erin K, PA-C  Multiple Vitamin (MULTI-VITAMIN) tablet Take 1 tablet by mouth daily. 05/08/16   [provider]  ondansetron  (ZOFRAN -ODT) 8 MG disintegrating tablet Take 1 tablet (8 mg total) by mouth every 8 (eight) hours as needed. 09/15/23   Dreama, [place]  N, FNP  umeclidinium bromide  (INCRUSE ELLIPTA ) 62.5 MCG/ACT AEPB Inhale 1 puff into the lungs daily. 10/02/22   Guilloud, Carolyn, MD  valsartan (DIOVAN) 80 MG tablet Take 80 mg by mouth daily. 09/29/22   [provider]    Family History Family History  Problem Relation Age of Onset   Hypertension Mother    Diabetes Maternal Grandmother    Colon cancer Maternal Aunt    Social History Social History   Tobacco Use   Smoking status: Every Day    Current packs/day: 0.50    Average packs/day: 0.5 packs/day for 45.0 years (22.5 ttl pk-yrs)    Types: Cigarettes   Smokeless tobacco: Never   Tobacco comments:    currently smoking 0.5 ppd, but she has cut down from over 1 ppd  Vaping Use   Vaping status: Never Used  Substance Use Topics   Alcohol use: Not Currently   Drug use: Not Currently    Types: Marijuana   Allergies   Epinephrine, Novocain [procaine], and Theraflu severe cold daytime [dm-phenylephrine -acetaminophen ]  Review of Systems Review of Systems Pertinent findings revealed after performing a 14 point review of systems has been noted in the history of present illness.  Physical Exam Vital Signs BP (!) 153/84 (BP Location: Left Arm)   Pulse 91    Temp 99.3 F (37.4 C) (Oral)   Resp (!) 24   Ht 5' 3 (1.6 m)   Wt 190 lb 0.6 oz (86.2 kg)   SpO2 91%   BMI 33.66 kg/m   No data found.  Physical Exam Vitals and nursing note reviewed.  Constitutional:      General: She is not in acute distress.    Appearance: Normal appearance. She is not ill-appearing.  HENT:     Head: Normocephalic and atraumatic.     Salivary Glands: Right salivary gland is not diffusely enlarged or tender. Left salivary gland is not diffusely enlarged or tender.     Right Ear: Tympanic membrane, ear canal and external ear normal. No drainage. No middle ear effusion. There is no impacted cerumen. Tympanic membrane is not erythematous  or bulging.     Left Ear: Tympanic membrane, ear canal and external ear normal. No drainage.  No middle ear effusion. There is no impacted cerumen. Tympanic membrane is not erythematous or bulging.     Nose: Nose normal. No nasal deformity, septal deviation, mucosal edema, congestion or rhinorrhea.     Right Turbinates: Not enlarged, swollen or pale.     Left Turbinates: Not enlarged, swollen or pale.     Right Sinus: No maxillary sinus tenderness or frontal sinus tenderness.     Left Sinus: No maxillary sinus tenderness or frontal sinus tenderness.     Mouth/Throat:     Lips: Pink. No lesions.     Mouth: Mucous membranes are moist. No oral lesions.     Pharynx: Oropharynx is clear. Uvula midline. No posterior oropharyngeal erythema or uvula swelling.     Tonsils: No tonsillar exudate. 0 on the right. 0 on the left.  Eyes:     General: Lids are normal.        Right eye: No discharge.        Left eye: No discharge.     Extraocular Movements: Extraocular movements intact.     Conjunctiva/sclera: Conjunctivae normal.     Right eye: Right conjunctiva is not injected.     Left eye: Left conjunctiva is not injected.  Neck:     Trachea: Trachea and phonation normal.  Cardiovascular:     Rate and Rhythm: Normal rate and regular  rhythm.     Pulses: Normal pulses.     Heart sounds: Normal heart sounds. No murmur heard.    No friction rub. No gallop.  Pulmonary:     Effort: Pulmonary effort is normal. No accessory muscle usage, prolonged expiration or respiratory distress.     Breath sounds: No stridor, decreased air movement or transmitted upper airway sounds. Examination of the right-upper field reveals decreased breath sounds. Examination of the left-upper field reveals decreased breath sounds. Examination of the right-middle field reveals decreased breath sounds. Examination of the left-middle field reveals decreased breath sounds. Examination of the right-lower field reveals decreased breath sounds. Examination of the left-lower field reveals decreased breath sounds. Decreased breath sounds present. No wheezing, rhonchi or rales.  Chest:     Chest wall: No tenderness.  Musculoskeletal:        General: Normal range of motion.     Cervical back: Normal range of motion and neck supple. Normal range of motion.  Lymphadenopathy:     Cervical: No cervical adenopathy.  Skin:    General: Skin is warm and dry.     Findings: No erythema or rash.  Neurological:     General: No focal deficit present.     Mental Status: She is alert and oriented to person, place, and time.  Psychiatric:        Mood and Affect: Mood normal.        Behavior: Behavior normal.     Visual Acuity Right Eye Distance:   Left Eye Distance:   Bilateral Distance:    Right Eye Near:   Left Eye Near:    Bilateral Near:     [facility] Couse / Diagnostics / Procedures:     Radiology DG Chest 2 View Result Date: 10/22/2023 CLINICAL DATA:  COPD exacerbation, SOB, Hx ARDS EXAM: CHEST - 2 VIEW COMPARISON:  Chest x-ray 03/16/2023 FINDINGS: The heart and mediastinal contours are within normal limits. Atherosclerotic plaque. No focal consolidation. Chronic coarsened interstitial markings with no overt  pulmonary edema. No pleural effusion. No pneumothorax.  No acute osseous abnormality. IMPRESSION: 1. No active cardiopulmonary disease. 2. Aortic Atherosclerosis (ICD10-I70.0) and Emphysema (ICD10-J43.9). Electronically Signed   By: Morgane  Naveau M.D.   On: 10/22/2023 10:03    Procedures Procedures (including critical care time) EKG  Pending results:  Labs Reviewed  POC RSV  POCT INFLUENZA A/B    Medications Ordered in UC: Medications  ipratropium-albuterol  (DUONEB) 0.5-2.5 (3) MG/3ML nebulizer solution 3 mL (3 mLs Nebulization Given 10/22/23 0939)    UC Diagnoses / Final Clinical Impressions(s)   I have reviewed the triage vital signs and the nursing notes.  Pertinent labs & imaging results that were available during my care of the patient were reviewed by me and considered in my medical decision making (see chart for details).    Final diagnoses:  COPD exacerbation (HCC)  SOB (shortness of breath)  History of acute respiratory distress syndrome (ARDS)   Patient advised that if radiology interpretation of chest x-ray, no acute findings.  Patient advised of negative influenza and RSV tests.  COVID-19 testing not indicated due to duration of symptoms.  Patient provided with pneumonia prophylaxis, azithromycin .  Patient provided prescription for DuoNeb treatment as this works better than her regular albuterol  nebs.  Patient provided with a tapering dose of methylprednisolone  and nighttime cough medicine.  Close follow-up with PCP recommended.  Return precautions advised.  Please see discharge instructions below for details of plan of care as provided to patient. ED Prescriptions     Medication Sig Dispense Auth. Provider   azithromycin  (ZITHROMAX ) 250 MG tablet Take 2 tablets (500 mg total) by mouth daily for 1 day, THEN 1 tablet (250 mg total) daily for 4 days. 6 tablet Joesph Shaver Scales, PA-C   methylPREDNISolone  (MEDROL  DOSEPAK) 4 MG TBPK tablet Take 24 mg on day 1, 20 mg on day 2, 16 mg on day 3, 12 mg on day 4, 8 mg on day 5,  4 mg on day 6.  Take all tablets in each row at once, do not spread tablets out throughout the day. 21 tablet Joesph Shaver Scales, PA-C   promethazine -dextromethorphan (PROMETHAZINE -DM) 6.25-15 MG/5ML syrup Take 5 mLs by mouth at bedtime as needed for cough. 60 mL Joesph Shaver Scales, PA-C   ipratropium-albuterol  (DUONEB) 0.5-2.5 (3) MG/3ML SOLN Take 3 mLs by nebulization every 4 (four) hours as needed. 360 mL Joesph Shaver Scales, PA-C      PDMP not reviewed this encounter.  Pending results:  Labs Reviewed  POC RSV  POCT INFLUENZA A/B      Discharge Instructions      Your rapid influenza and RSV antigen test today was negative.  No further influenza testing is indicated.   Please read below to learn more about the medications, dosages and frequencies that I recommend to help alleviate your symptoms and to get you feeling better soon:   Z-Pak (azithromycin ):  Please take two (2) tablets on day one and one tablet daily thereafter until the prescription is complete.This antibiotic can cause upset stomach, this will resolve once antibiotics are complete.  You are welcome to take a probiotic, eat yogurt, take Imodium while taking this medication.  Please avoid other systemic medications such as Maalox, Pepto-Bismol or milk of magnesia as they can interfere with the body's ability to absorb the antibiotics.       DuoNeb (ipratropium and albuterol ): This inhaled medication contains a short acting beta agonist bronchodilator and a muscarinic bronchodilator.  This medication works  on the smooth muscle that opens and constricts of your airways by relaxing the muscle.  The result of relaxation of the smooth muscle is increased air movement and improved work of breathing.  This is a short acting medication that can be used every 4-6 hours as needed for increased work of breathing, shortness of breath, wheezing and excessive coughing.  For the next 2 weeks, please use this medication instead of  your albuterol .   Promethazine  DM: Promethazine  is both a nasal decongestant that dries up mucous membranes and an antinausea medication.  Promethazine  often makes most patients feel fairly sleepy.  DM is dextromethorphan, a single symptom reliever which is a cough suppressant found in many over-the-counter cough medications and combination cold preparations.  Please take 5 mL before bedtime to minimize your cough which will help you sleep better.  I have sent a prescription for this medication to your pharmacy because it cannot be purchased over-the-counter.   If symptoms have not meaningfully improved in the next 5 to 7 days, please return for repeat evaluation or follow-up with your regular provider.  If symptoms have worsened in the next 3 to 5 days, please return for repeat evaluation or follow-up with your regular provider.    Thank you for visiting urgent care today.  We appreciate the opportunity to participate in your care.       Disposition Upon Discharge:  Condition: stable for discharge home  Patient presented with an acute illness with associated systemic symptoms and significant discomfort requiring urgent management. In my opinion, this is a condition that a prudent lay person (someone who possesses an average knowledge of health and medicine) may potentially expect to result in complications if not addressed urgently such as respiratory distress, impairment of bodily function or dysfunction of bodily organs.   Routine symptom specific, illness specific and/or disease specific instructions were discussed with the patient and/or caregiver at length.   As such, the patient has been evaluated and assessed, work-up was performed and treatment was provided in alignment with urgent care protocols and evidence based medicine.  Patient/parent/caregiver has been advised that the patient may require follow up for further testing and treatment if the symptoms continue in spite of treatment,  as clinically indicated and appropriate.  Patient/parent/caregiver has been advised to return to the [facility] or PCP if no better; to PCP or the Emergency Department if new signs and symptoms develop, or if the current signs or symptoms continue to change or worsen for further workup, evaluation and treatment as clinically indicated and appropriate  The patient will follow up with their current PCP if and as advised. If the patient does not currently have a PCP we will assist them in obtaining one.   The patient may need specialty follow up if the symptoms continue, in spite of conservative treatment and management, for further workup, evaluation, consultation and treatment as clinically indicated and appropriate.  Patient/parent/caregiver verbalized understanding and agreement of plan as discussed.  All questions were addressed during visit.  Please see discharge instructions below for further details of plan.  This office note has been dictated using Teaching laboratory technician.  Unfortunately, this method of dictation can sometimes lead to typographical or grammatical errors.  I apologize for your inconvenience in advance if this occurs.  Please do not hesitate to reach out to me if clarification is needed.      Joesph Shaver Scales, PA-C 10/22/23 1021    Joesph Shaver [place], [place] 10/22/23 1023

## 2023-10-22 NOTE — ED Triage Notes (Signed)
 SOB, cough, sore throat x 3 days. No known sick exposure. Patient has a history of COPD, throat cancer.   Patient has not taken any meds for her symptoms.

## 2023-11-06 ENCOUNTER — Ambulatory Visit (HOSPITAL_COMMUNITY)
Admission: EM | Admit: 2023-11-06 | Discharge: 2023-11-06 | Disposition: A | Discharge status: Home / Self Care | Payer: 59 | Attending: Family Medicine | Admitting: Family Medicine

## 2023-11-06 DIAGNOSIS — M79671 Pain in right foot: Secondary | ICD-10-CM | POA: Insufficient documentation

## 2023-11-06 DIAGNOSIS — M79672 Pain in left foot: Secondary | ICD-10-CM | POA: Insufficient documentation

## 2023-11-06 LAB — BASIC METABOLIC PANEL WITH GFR
Anion gap: 13 (ref 5–15)
BUN: 11 mg/dL (ref 8–23)
CO2: 25 mmol/L (ref 22–32)
Calcium: 9.2 mg/dL (ref 8.9–10.3)
Chloride: 100 mmol/L (ref 98–111)
Creatinine, Ser: 0.83 mg/dL (ref 0.44–1.00)
GFR, Estimated: >60 mL/min
Glucose, Bld: 91 mg/dL (ref 70–99)
Potassium: 4 mmol/L (ref 3.5–5.1)
Sodium: 138 mmol/L (ref 135–145)

## 2023-11-06 LAB — CBC
HCT: 39.1 % (ref 36.0–46.0)
Hemoglobin: 13.1 g/dL (ref 12.0–15.0)
MCH: 31 pg (ref 26.0–34.0)
MCHC: 33.5 g/dL (ref 30.0–36.0)
MCV: 92.4 fL (ref 80.0–100.0)
Platelets: 192 10*3/uL (ref 150–400)
RBC: 4.23 MIL/uL (ref 3.87–5.11)
RDW: 13.8 % (ref 11.5–15.5)
WBC: 9.2 10*3/uL (ref 4.0–10.5)
nRBC: 0 % (ref 0.0–0.2)

## 2023-11-06 LAB — URIC ACID: Uric Acid, Serum: 5.5 mg/dL (ref 2.5–7.1)

## 2023-11-06 MED ORDER — PREDNISONE 20 MG PO TABS: 40.0000 mg | ORAL_TABLET | Freq: Every day | ORAL | 0 refills | Status: AC

## 2023-11-06 NOTE — ED Provider Notes (Signed)
[facility]    CSN: [id number] Arrival date & time: 11/06/23  1013      History   Chief Complaint Chief Complaint  Patient presents with   Foot Pain    HPI Zoe Snyder is a 68 y.o. female.    Foot Pain  Here for pain in both sets of toes.  Began bothering her on January 22.  The  right toes bother her more than the left.  She feels that they are red compared to usual.  Her friends told her it might be gout  No trauma or fall.  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  She expresses concern that this might be frostbite  Her allergy list states she is allergic to epinephrine  She has meloxicam on her list but is not taking it currently.  Last EGFR was normal  Past Medical History:  Diagnosis Date   COPD (chronic obstructive pulmonary disease) (HCC)    Hashimoto's thyroiditis    History of alcohol abuse 11/30/2017   History of cocaine abuse (HCC) 11/30/2017   History of colon polyps    Hyperlipidemia    Hypertension    Prediabetes    Throat cancer (HCC)    Tobacco use disorder     Patient Active Problem List   Diagnosis Date Noted   Acute respiratory failure with hypoxia (HCC) 09/29/2022   Influenza due to influenza A virus 09/29/2022   COPD with acute exacerbation (HCC) 12/08/2017   Heavy tobacco smoker >10 cigarettes per day 12/08/2017   Unintended weight loss 12/08/2017   Mixed hyperlipidemia 12/08/2017   Hypertension goal BP (blood pressure) < 130/80 12/08/2017   Prediabetes 12/08/2017   Throat cancer (HCC) 12/08/2017   History of cocaine abuse (HCC) 11/30/2017   History of alcohol abuse 11/30/2017   Refused influenza vaccine 11/29/2017   History of colon polyps    Olecranon bursitis of right elbow 02/03/2017   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   Subclinical hypothyroidism 05/06/2016   Muscle cramps 04/29/2016   Malignant neoplasm of larynx  (HCC) 01/09/2016   Dysphonia 03/21/2015   Displaced comminuted fracture of shaft of right fibula, sequela 03/07/2015   Anxiety disorder 01/16/2014   Obesity 10/19/2012   Dry eye syndrome 06/10/2009   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      Home Medications    Prior to Admission medications   Medication Sig Start Date End Date Taking? Authorizing Provider  amLODipine (NORVASC) 10 MG tablet Take 1 tablet (10 mg total) by mouth daily. Due for follow up visit 08/05/21  Yes Pricilla Loveless, MD  atorvastatin (LIPITOR) 20 MG tablet Take 20 mg by mouth at bedtime.   Yes [provider]  Cholecalciferol (VITAMIN D-1000 MAX ST) 25 MCG (1000 UT) tablet Take 1 tablet (1,000 Units total) by mouth daily. 08/13/23  Yes Elson Areas, PA-C  levothyroxine (SYNTHROID) 75 MCG tablet Take 75 mcg by mouth every morning. 06/21/23  Yes [provider]  Multiple Vitamin (MULTI-VITAMIN) tablet Take 1 tablet by mouth daily.  05/08/16  Yes [provider]  predniSONE (DELTASONE) 20 MG tablet Take 2 tablets (40 mg total) by mouth daily with breakfast for 5 days. 11/06/23 11/11/23 Yes Zenia Resides, MD  valsartan (DIOVAN) 80 MG tablet Take 80 mg by mouth daily. 09/29/22  Yes [provider]  albuterol (PROVENTIL) (2.5 MG/3ML) 0.083% nebulizer solution Inhale 3 mLs (2.5 mg total) by nebulization every 6 (six) hours as needed for wheezing or shortness of breath. 03/16/23   Bing Neighbors, NP  albuterol (VENTOLIN HFA) 108 (90 Base) MCG/ACT inhaler Inhale 2 puffs into the lungs every 4 (four) hours as needed for wheezing or shortness of breath. 03/16/23   Bing Neighbors, NP  fluticasone furoate-vilanterol  (BREO ELLIPTA) 100-25 MCG/ACT AEPB Inhale 1 puff into the lungs daily. 10/01/22   Crissie Sickles, MD  ipratropium-albuterol (DUONEB) 0.5-2.5 (3) MG/3ML SOLN Take 3 mLs by nebulization every 4 (four) hours as needed. 10/22/23   Theadora Rama Scales, PA-C  meloxicam (MOBIC) 7.5 MG tablet Take 1 tablet (7.5 mg total) by mouth daily. Patient taking differently: Take 15 mg by mouth daily. 05/22/22   Raspet, Noberto Retort, PA-C  umeclidinium bromide (INCRUSE ELLIPTA) 62.5 MCG/ACT AEPB Inhale 1 puff into the lungs daily. 10/02/22   Reymundo Poll, MD    Family History Family History  Problem Relation Age of Onset   Hypertension Mother    Diabetes Maternal Grandmother    Colon cancer Maternal Aunt     Social History Social History   Tobacco Use   Smoking status: Every Day    Current packs/day: 0.50    Average packs/day: 0.5 packs/day for 45.0 years (22.5 ttl pk-yrs)    Types: Cigarettes   Smokeless tobacco: Never   Tobacco comments:    currently smoking 0.5 ppd, but she has cut down from over 1 ppd  Vaping Use   Vaping status: Never Used  Substance Use Topics   Alcohol use: Not Currently   Drug use: Not Currently    Types: Marijuana     Allergies   Epinephrine, Novocain [procaine], and Theraflu severe cold daytime [dm-phenylephrine-acetaminophen]   Review of Systems Review of Systems   Physical Exam Triage Vital Signs ED Triage Vitals  Encounter Vitals Group     BP 11/06/23 1101 129/89     Systolic BP Percentile --      Diastolic BP Percentile --      Pulse Rate 11/06/23 1101 79     Resp 11/06/23 1101 16     Temp 11/06/23 1101 98.9 F (37.2 C)     Temp Source 11/06/23 1101 Oral     SpO2 11/06/23 1101 95 %     Weight --      Height --      Head Circumference --      Peak Flow --      Pain Score 11/06/23 1103 9     Pain Loc --      Pain Education --      Exclude from Growth Chart --    No data found.  Updated Vital Signs BP 129/89 (BP Location: Left Arm)   Pulse  79   Temp 98.9 F (37.2 C) (Oral)   Resp 16   SpO2 95%   Visual Acuity Right Eye Distance:   Left Eye Distance:   Bilateral Distance:    Right Eye Near:   Left Eye Near:    Bilateral Near:     Physical Exam Vitals reviewed.  Constitutional:  General: She is not in acute distress.    Appearance: She is not toxic-appearing.  Musculoskeletal:     Comments: There is no obvious swelling of the toes.  There is very mild erythema at most of the toes.  Minimal touch causes discomfort.  Sensation intact  Skin:    Coloration: Skin is not jaundiced or pale.  Neurological:     General: No focal deficit present.     Mental Status: She is alert and oriented to person, place, and time.      UC Treatments / Results  Labs (all labs ordered are listed, but only abnormal results are displayed) Labs Reviewed  CBC  BASIC METABOLIC PANEL  URIC ACID    EKG   Radiology No results found.  Procedures Procedures (including critical care time)  Medications Ordered in UC Medications - No data to display  Initial Impression / Assessment and Plan / UC Course  I have reviewed the triage vital signs and the nursing notes.  Pertinent labs & imaging results that were available during my care of the patient were reviewed by me and considered in my medical decision making (see chart for details).     Prednisone burst sent to the pharmacy as that would help gout or a flare of osteoarthritis.  Uric acid and BMP and CBC are drawn and we will notify her if anything significantly abnormal.  I do not think this is frostbite. Final Clinical Impressions(s) / UC Diagnoses   Final diagnoses:  Bilateral foot pain     Discharge Instructions      Take prednisone 20 mg--2 daily for 5 days  We have drawn blood to check your blood counts, electrolytes and kidney function and sugar, and uric acid.  Our staff will call you if there is anything significantly abnormal       ED  Prescriptions     Medication Sig Dispense Auth. Provider   predniSONE (DELTASONE) 20 MG tablet Take 2 tablets (40 mg total) by mouth daily with breakfast for 5 days. 10 tablet Marlinda Mike Janace Aris, MD      I have reviewed the PDMP during this encounter.   Zenia Resides, MD 11/06/23 1131

## 2023-11-06 NOTE — ED Triage Notes (Signed)
Patient states she has bilateral foot pain.Patient stands for 12 hours and wears steel toe boots.

## 2023-11-06 NOTE — Discharge Instructions (Signed)
Take prednisone 20 mg--2 daily for 5 days  We have drawn blood to check your blood counts, electrolytes and kidney function and sugar, and uric acid.  Our staff will call you if there is anything significantly abnormal

## 2023-11-17 ENCOUNTER — Ambulatory Visit (HOSPITAL_COMMUNITY)
Admission: EM | Admit: 2023-11-17 | Discharge: 2023-11-17 | Disposition: A | Discharge status: Home / Self Care | Payer: 59

## 2023-11-17 ENCOUNTER — Encounter (HOSPITAL_COMMUNITY): Payer: Self-pay

## 2023-11-17 DIAGNOSIS — M79605 Pain in left leg: Secondary | ICD-10-CM

## 2023-11-17 NOTE — ED Triage Notes (Signed)
Pt c/o of sweats, numbness in her foot, tingling/burning in her toes, color change in her toes and ankle, Pt states "it feels cold and painful from my foot up my leg into my side, hip, and butt."  Start Date: 1 week

## 2023-11-17 NOTE — Discharge Instructions (Signed)
 Please head to the nearest emergency department for advanced evaluation of your diminished capillary refill and left leg pain.  If you choose not to go to the emergency department, please note that you may have a life-threatening condition that could kill you.  Issues of diminished perfusion to include amputation, limb loss and death.

## 2023-11-17 NOTE — ED Provider Notes (Signed)
 [facility]    CSN: [id number] Arrival date & time: 11/17/23  1501      History   Chief Complaint Chief Complaint  Patient presents with   Cold Extremity   Leg Pain    HPI Zoe Snyder is a 68 y.o. female.   Patient presents to clinic with left leg pain, complaints of her left leg feeling cool to touch, numbness in her foot and a tingling burning sensation in her toes.  Endorses delayed capillary refill.  She did feel little bit better with the prednisone . Is concerned that her great toe and second toe of her left foot are very painful and are turning black.  The history is provided by the patient and medical records.  Leg Pain   Past Medical History:  Diagnosis Date   COPD (chronic obstructive pulmonary disease) (HCC)    Hashimoto's thyroiditis    History of alcohol abuse 11/30/2017   History of cocaine abuse (HCC) 11/30/2017   History of colon polyps    Hyperlipidemia    Hypertension    Prediabetes    Throat cancer (HCC)    Tobacco use disorder     Patient Active Problem List   Diagnosis Date Noted   Acute respiratory failure with hypoxia (HCC) 09/29/2022   Influenza due to influenza A virus 09/29/2022   COPD with acute exacerbation (HCC) 12/08/2017   Heavy tobacco smoker >10 cigarettes per day 12/08/2017   Unintended weight loss 12/08/2017   Mixed hyperlipidemia 12/08/2017   Hypertension goal BP (blood pressure) < 130/80 12/08/2017   Prediabetes 12/08/2017   Throat cancer (HCC) 12/08/2017   History of cocaine abuse (HCC) 11/30/2017   History of alcohol abuse 11/30/2017   Refused influenza vaccine 11/29/2017   History of colon polyps    Olecranon bursitis of right elbow 02/03/2017   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   Subclinical hypothyroidism 05/06/2016   Muscle cramps 04/29/2016   Malignant neoplasm of larynx (HCC) 01/09/2016   Dysphonia 03/21/2015   Displaced comminuted fracture of shaft of right fibula, sequela 03/07/2015   Anxiety  disorder 01/16/2014   Obesity 10/19/2012   Dry eye syndrome 06/10/2009   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      Home Medications    Prior to Admission medications   Medication Sig Start Date End Date Taking? Authorizing Provider  amLODipine  (NORVASC ) 10 MG tablet Take 1 tablet (10 mg total) by mouth daily. Due for follow up visit 08/05/21  Yes Freddi Hamilton, MD  atorvastatin  (LIPITOR) 20 MG tablet Take 20 mg by mouth at bedtime.   Yes [provider]  levothyroxine  (SYNTHROID ) 75 MCG tablet Take 75 mcg by mouth every morning. 06/21/23  Yes [provider]  methylPREDNISolone  (MEDROL ) 4 MG tablet Take by mouth. 10/22/23  Yes [provider]  valsartan (DIOVAN) 80 MG tablet Take 80 mg by mouth daily. 09/29/22  Yes [provider]  albuterol  (PROVENTIL ) (2.5 MG/3ML) 0.083% nebulizer solution Inhale 3 mLs (2.5 mg total) by nebulization every 6 (six) hours as needed for wheezing or shortness of breath. 03/16/23   Arloa Suzen RAMAN, NP  albuterol  (VENTOLIN  HFA) [phone] Base) MCG/ACT  inhaler Inhale 2 puffs into the lungs every 4 (four) hours as needed for wheezing or shortness of breath. 03/16/23   Arloa Suzen RAMAN, NP  Cholecalciferol  (VITAMIN D -1000 MAX ST) 25 MCG (1000 UT) tablet Take 1 tablet (1,000 Units total) by mouth daily. 08/13/23   Flint Sonny POUR, PA-C  fluticasone  furoate-vilanterol (BREO ELLIPTA ) 100-25 MCG/ACT AEPB Inhale 1 puff into the lungs daily. 10/01/22   Rudy Sieving, MD  ipratropium-albuterol  (DUONEB) 0.5-2.5 (3) MG/3ML SOLN Take 3 mLs by nebulization every 4 (four) hours as needed. 10/22/23   Joesph Shaver Scales, PA-C  meloxicam  (MOBIC ) 7.5 MG tablet Take 1  tablet (7.5 mg total) by mouth daily. Patient taking differently: Take 15 mg by mouth daily. 05/22/22   Raspet, Erin K, PA-C  Multiple Vitamin (MULTI-VITAMIN) tablet Take 1 tablet by mouth daily. 05/08/16   [provider]  umeclidinium bromide  (INCRUSE ELLIPTA ) 62.5 MCG/ACT AEPB Inhale 1 puff into the lungs daily. 10/02/22   Forest Coy, MD    Family History Family History  Problem Relation Age of Onset   Hypertension Mother    Diabetes Maternal Grandmother    Colon cancer Maternal Aunt     Social History Social History   Tobacco Use   Smoking status: Every Day    Current packs/day: 0.50    Average packs/day: 0.5 packs/day for 45.0 years (22.5 ttl pk-yrs)    Types: Cigarettes   Smokeless tobacco: Never   Tobacco comments:    currently smoking 0.5 ppd, but she has cut down from over 1 ppd  Vaping Use   Vaping status: Never Used  Substance Use Topics   Alcohol use: Not Currently   Drug use: Not Currently    Types: Marijuana     Allergies   Epinephrine, Novocain [procaine], and Theraflu severe cold daytime [dm-phenylephrine -acetaminophen ]   Review of Systems Review of Systems  Per HPI   Physical Exam Triage Vital Signs ED Triage Vitals  Encounter Vitals Group     BP 11/17/23 1710 (!) 149/92     Systolic BP Percentile --      Diastolic BP Percentile --      Pulse Rate 11/17/23 1710 84     Resp 11/17/23 1710 18     Temp 11/17/23 1710 99.4 F (37.4 C)     Temp Source 11/17/23 1710 Oral     SpO2 11/17/23 1710 94 %     Weight --      Height --      Head Circumference --      Peak Flow --      Pain Score 11/17/23 1704 10     Pain Loc --      Pain Education --      Exclude from Growth Chart --    No data found.  Updated Vital Signs BP (!) 149/92 (BP Location: Right Arm)   Pulse 84   Temp 99.4 F (37.4 C) (Oral)   Resp 18   SpO2 94%   Visual Acuity Right Eye Distance:   Left Eye Distance:   Bilateral Distance:    Right Eye Near:    Left Eye Near:    Bilateral Near:     Physical Exam Vitals and nursing note reviewed.  Constitutional:      Appearance: Normal appearance.  HENT:     Head: Normocephalic and atraumatic.     Right Ear: External ear normal.     Left Ear: External ear normal.     Nose: Nose normal.  Mouth/Throat:     Mouth: Mucous membranes are moist.  Eyes:     Conjunctiva/sclera: Conjunctivae normal.  Cardiovascular:     Rate and Rhythm: Normal rate.     Pulses: Normal pulses.     Heart sounds: No murmur heard. Pulmonary:     Effort: Pulmonary effort is normal. No respiratory distress.  Musculoskeletal:        General: Normal range of motion.       Feet:  Feet:     Comments: Erythema at the distal tip of the left great toe and throughout the Overall left foot is cold to touch with entire second toe.  Diminished capillary refill.  Left-sided posterior tibialis pulse is faint. Skin:    General: Skin is warm and dry.     Capillary Refill: Capillary refill takes more than 3 seconds.     Findings: Erythema present.  Neurological:     General: No focal deficit present.     Mental Status: She is alert.  Psychiatric:        Mood and Affect: Mood normal.        Behavior: Behavior is cooperative.      UC Treatments / Results  Labs (all labs ordered are listed, but only abnormal results are displayed) Labs Reviewed - No data to display  EKG   Radiology No results found.  Procedures Procedures (including critical care time)  Medications Ordered in UC Medications - No data to display  Initial Impression / Assessment and Plan / UC Course  I have reviewed the triage vital signs and the nursing notes.  Pertinent labs & imaging results that were available during my care of the patient were reviewed by me and considered in my medical decision making (see chart for details).  Vitals and triage reviewed, presentation concerning for diminished capillary refill of the left lower  extremity and left lower extremity is cool to touch.  Discussed patient would benefit from further examination at the nearest emergency department and advanced imaging such as ultrasound.  Unable to order this outpatient, as ultrasound clinics are closed at this time.  Unclear etiology to discoloration of toes, without erythema or warmth.  Concern for circulatory compromise.  No shortness of breath, chest pain or coughing up blood.  Encourage patient to head to the nearest emergency department.  Patient reports she has no one to pick her up or take her home so she will go tomorrow.  Risk for not seeking emergent care discussed such as death and limb loss.  Patient verbalized understanding.     Final Clinical Impressions(s) / UC Diagnoses   Final diagnoses:  Left leg pain     Discharge Instructions      Please head to the nearest emergency department for advanced evaluation of your diminished capillary refill and left leg pain.  If you choose not to go to the emergency department, please note that you may have a life-threatening condition that could kill you.  Issues of diminished perfusion to include amputation, limb loss and death.    ED Prescriptions   None    PDMP not reviewed this encounter.   Dreama, Woodruff Skirvin  N, FNP 11/17/23 1734

## 2023-11-18 ENCOUNTER — Emergency Department (HOSPITAL_COMMUNITY): Payer: 59

## 2023-11-18 ENCOUNTER — Inpatient Hospital Stay (HOSPITAL_COMMUNITY)
Admission: EM | Admit: 2023-11-18 | Discharge: 2023-11-19 | DRG: 253 | Disposition: A | Discharge status: Home / Self Care | Payer: 59 | Attending: Vascular Surgery | Admitting: Vascular Surgery

## 2023-11-18 ENCOUNTER — Encounter (HOSPITAL_COMMUNITY)
Admission: EM | Disposition: A | Discharge status: Home / Self Care | Payer: Self-pay | Source: Home / Self Care | Attending: Vascular Surgery

## 2023-11-18 ENCOUNTER — Emergency Department (HOSPITAL_COMMUNITY): Payer: 59 | Admitting: Anesthesiology

## 2023-11-18 ENCOUNTER — Telehealth (HOSPITAL_COMMUNITY): Payer: Self-pay | Admitting: Pharmacy Technician

## 2023-11-18 ENCOUNTER — Other Ambulatory Visit: Payer: Self-pay

## 2023-11-18 ENCOUNTER — Other Ambulatory Visit (HOSPITAL_COMMUNITY): Payer: Self-pay

## 2023-11-18 ENCOUNTER — Encounter (HOSPITAL_COMMUNITY): Payer: Self-pay

## 2023-11-18 ENCOUNTER — Inpatient Hospital Stay (HOSPITAL_COMMUNITY): Payer: 59

## 2023-11-18 DIAGNOSIS — I10 Essential (primary) hypertension: Secondary | ICD-10-CM | POA: Diagnosis present

## 2023-11-18 DIAGNOSIS — Z79890 Hormone replacement therapy (postmenopausal): Secondary | ICD-10-CM

## 2023-11-18 DIAGNOSIS — I82412 Acute embolism and thrombosis of left femoral vein: Secondary | ICD-10-CM | POA: Diagnosis present

## 2023-11-18 DIAGNOSIS — F17210 Nicotine dependence, cigarettes, uncomplicated: Secondary | ICD-10-CM

## 2023-11-18 DIAGNOSIS — E785 Hyperlipidemia, unspecified: Secondary | ICD-10-CM | POA: Diagnosis present

## 2023-11-18 DIAGNOSIS — J449 Chronic obstructive pulmonary disease, unspecified: Secondary | ICD-10-CM

## 2023-11-18 DIAGNOSIS — I70222 Atherosclerosis of native arteries of extremities with rest pain, left leg: Secondary | ICD-10-CM

## 2023-11-18 DIAGNOSIS — Z860100 Personal history of colon polyps, unspecified: Secondary | ICD-10-CM | POA: Diagnosis not present

## 2023-11-18 DIAGNOSIS — M79662 Pain in left lower leg: Secondary | ICD-10-CM | POA: Diagnosis not present

## 2023-11-18 DIAGNOSIS — Z8249 Family history of ischemic heart disease and other diseases of the circulatory system: Secondary | ICD-10-CM

## 2023-11-18 DIAGNOSIS — Z79899 Other long term (current) drug therapy: Secondary | ICD-10-CM | POA: Diagnosis not present

## 2023-11-18 DIAGNOSIS — I743 Embolism and thrombosis of arteries of the lower extremities: Principal | ICD-10-CM

## 2023-11-18 DIAGNOSIS — Z884 Allergy status to anesthetic agent status: Secondary | ICD-10-CM | POA: Diagnosis not present

## 2023-11-18 DIAGNOSIS — Z7951 Long term (current) use of inhaled steroids: Secondary | ICD-10-CM

## 2023-11-18 DIAGNOSIS — Z7952 Long term (current) use of systemic steroids: Secondary | ICD-10-CM

## 2023-11-18 DIAGNOSIS — Z85819 Personal history of malignant neoplasm of unspecified site of lip, oral cavity, and pharynx: Secondary | ICD-10-CM | POA: Diagnosis not present

## 2023-11-18 DIAGNOSIS — Z888 Allergy status to other drugs, medicaments and biological substances status: Secondary | ICD-10-CM

## 2023-11-18 DIAGNOSIS — Z833 Family history of diabetes mellitus: Secondary | ICD-10-CM | POA: Diagnosis not present

## 2023-11-18 DIAGNOSIS — I70229 Atherosclerosis of native arteries of extremities with rest pain, unspecified extremity: Secondary | ICD-10-CM | POA: Diagnosis present

## 2023-11-18 DIAGNOSIS — Z800 Family history of malignant neoplasm of digestive organs: Secondary | ICD-10-CM | POA: Diagnosis not present

## 2023-11-18 DIAGNOSIS — E063 Autoimmune thyroiditis: Secondary | ICD-10-CM | POA: Diagnosis present

## 2023-11-18 DIAGNOSIS — R7303 Prediabetes: Secondary | ICD-10-CM | POA: Diagnosis present

## 2023-11-18 DIAGNOSIS — Z791 Long term (current) use of non-steroidal anti-inflammatories (NSAID): Secondary | ICD-10-CM | POA: Diagnosis not present

## 2023-11-18 DIAGNOSIS — Z7901 Long term (current) use of anticoagulants: Secondary | ICD-10-CM

## 2023-11-18 DIAGNOSIS — I82432 Acute embolism and thrombosis of left popliteal vein: Secondary | ICD-10-CM | POA: Diagnosis present

## 2023-11-18 DIAGNOSIS — I745 Embolism and thrombosis of iliac artery: Secondary | ICD-10-CM | POA: Diagnosis present

## 2023-11-18 HISTORY — PX: ANGIOPLASTY: SHX39

## 2023-11-18 HISTORY — PX: GROIN EXPOSURE: SHX7357

## 2023-11-18 HISTORY — PX: AORTOGRAM: SHX6300

## 2023-11-18 HISTORY — PX: ULTRASOUND GUIDANCE FOR VASCULAR ACCESS: SHX6516

## 2023-11-18 LAB — POCT I-STAT, CHEM 8
BUN: 16 mg/dL (ref 8–23)
Calcium, Ion: 1.21 mmol/L (ref 1.15–1.40)
Chloride: 104 mmol/L (ref 98–111)
Creatinine, Ser: 1 mg/dL (ref 0.44–1.00)
Glucose, Bld: 142 mg/dL — ABNORMAL HIGH (ref 70–99)
HCT: 35 % — ABNORMAL LOW (ref 36.0–46.0)
Hemoglobin: 11.9 g/dL — ABNORMAL LOW (ref 12.0–15.0)
Potassium: 4.2 mmol/L (ref 3.5–5.1)
Sodium: 138 mmol/L (ref 135–145)
TCO2: 25 mmol/L (ref 22–32)

## 2023-11-18 LAB — COMPREHENSIVE METABOLIC PANEL
ALT: 18 U/L (ref 0–44)
AST: 14 U/L — ABNORMAL LOW (ref 15–41)
Albumin: 3.5 g/dL (ref 3.5–5.0)
Alkaline Phosphatase: 76 U/L (ref 38–126)
Anion gap: 11 (ref 5–15)
BUN: 19 mg/dL (ref 8–23)
CO2: 23 mmol/L (ref 22–32)
Calcium: 9.5 mg/dL (ref 8.9–10.3)
Chloride: 103 mmol/L (ref 98–111)
Creatinine, Ser: 1 mg/dL (ref 0.44–1.00)
GFR, Estimated: >60 mL/min (ref >60)
Glucose, Bld: 114 mg/dL — ABNORMAL HIGH (ref 70–99)
Potassium: 4.2 mmol/L (ref 3.5–5.1)
Sodium: 137 mmol/L (ref 135–145)
Total Bilirubin: 0.6 mg/dL (ref 0.0–1.2)
Total Protein: 6.7 g/dL (ref 6.5–8.1)

## 2023-11-18 LAB — POCT I-STAT 7, (LYTES, BLD GAS, ICA,H+H)
Acid-base deficit: 1 mmol/L (ref 0.0–2.0)
Bicarbonate: 25.5 mmol/L (ref 20.0–28.0)
Calcium, Ion: 1.22 mmol/L (ref 1.15–1.40)
HCT: 36 % (ref 36.0–46.0)
Hemoglobin: 12.2 g/dL (ref 12.0–15.0)
O2 Saturation: 98 %
Patient temperature: 36.5
Potassium: 4.3 mmol/L (ref 3.5–5.1)
Sodium: 137 mmol/L (ref 135–145)
TCO2: 27 mmol/L (ref 22–32)
pCO2 arterial: 45.4 mm[Hg] (ref 32–48)
pH, Arterial: 7.355 (ref 7.35–7.45)
pO2, Arterial: 117 mm[Hg] — ABNORMAL HIGH (ref 83–108)

## 2023-11-18 LAB — ABO/RH: ABO/RH(D): B POS

## 2023-11-18 LAB — CBC WITH DIFFERENTIAL/PLATELET
Abs Immature Granulocytes: 0.12 10*3/uL — ABNORMAL HIGH (ref 0.00–0.07)
Basophils Absolute: 0.1 10*3/uL (ref 0.0–0.1)
Basophils Relative: 1 %
Eosinophils Absolute: 0.3 10*3/uL (ref 0.0–0.5)
Eosinophils Relative: 3 %
HCT: 43 % (ref 36.0–46.0)
Hemoglobin: 14.3 g/dL (ref 12.0–15.0)
Immature Granulocytes: 1 %
Lymphocytes Relative: 19 %
Lymphs Abs: 2.3 10*3/uL (ref 0.7–4.0)
MCH: 31 pg (ref 26.0–34.0)
MCHC: 33.3 g/dL (ref 30.0–36.0)
MCV: 93.1 fL (ref 80.0–100.0)
Monocytes Absolute: 1.2 10*3/uL — ABNORMAL HIGH (ref 0.1–1.0)
Monocytes Relative: 10 %
Neutro Abs: 8.3 10*3/uL — ABNORMAL HIGH (ref 1.7–7.7)
Neutrophils Relative %: 66 %
Platelets: 272 10*3/uL (ref 150–400)
RBC: 4.62 MIL/uL (ref 3.87–5.11)
RDW: 14.4 % (ref 11.5–15.5)
WBC: 12.3 10*3/uL — ABNORMAL HIGH (ref 4.0–10.5)
nRBC: 0 % (ref 0.0–0.2)

## 2023-11-18 LAB — POCT ACTIVATED CLOTTING TIME
Activated Clotting Time: 227 s
Activated Clotting Time: 245 s
Activated Clotting Time: 268 s
Activated Clotting Time: 291 s

## 2023-11-18 LAB — TYPE AND SCREEN
ABO/RH(D): B POS
Antibody Screen: NEGATIVE

## 2023-11-18 LAB — TSH: TSH: 7.019 u[IU]/mL — ABNORMAL HIGH (ref 0.350–4.500)

## 2023-11-18 LAB — SURGICAL PCR SCREEN
MRSA, PCR: NEGATIVE
Staphylococcus aureus: NEGATIVE

## 2023-11-18 LAB — T4, FREE: Free T4: 0.76 ng/dL (ref 0.61–1.12)

## 2023-11-18 SURGERY — AORTOGRAM
Anesthesia: General | Site: Groin | Laterality: Right

## 2023-11-18 MED ORDER — ONDANSETRON HCL 4 MG/2ML IJ SOLN
INTRAMUSCULAR | Status: AC
  Filled 2023-11-18: qty 2

## 2023-11-18 MED ORDER — ROCURONIUM BROMIDE 10 MG/ML (PF) SYRINGE
PREFILLED_SYRINGE | INTRAVENOUS | Status: AC
  Filled 2023-11-18: qty 10

## 2023-11-18 MED ORDER — FENTANYL CITRATE (PF) 100 MCG/2ML IJ SOLN
25.0000 ug | INTRAMUSCULAR | Status: DC | PRN
  Administered 2023-11-18 (×3): 25 ug via INTRAVENOUS

## 2023-11-18 MED ORDER — CLOPIDOGREL BISULFATE 75 MG PO TABS
300.0000 mg | ORAL_TABLET | Freq: Once | ORAL | Status: AC
  Administered 2023-11-18: 300 mg via ORAL
  Filled 2023-11-18: qty 4

## 2023-11-18 MED ORDER — PROTAMINE SULFATE 10 MG/ML IV SOLN
INTRAVENOUS | Status: DC | PRN
  Administered 2023-11-18: 50 mg via INTRAVENOUS

## 2023-11-18 MED ORDER — ROCURONIUM BROMIDE 10 MG/ML (PF) SYRINGE
PREFILLED_SYRINGE | INTRAVENOUS | Status: DC | PRN
  Administered 2023-11-18: 10 mg via INTRAVENOUS
  Administered 2023-11-18: 60 mg via INTRAVENOUS
  Administered 2023-11-18: 20 mg via INTRAVENOUS
  Administered 2023-11-18: 10 mg via INTRAVENOUS

## 2023-11-18 MED ORDER — DEXAMETHASONE SODIUM PHOSPHATE 10 MG/ML IJ SOLN
INTRAMUSCULAR | Status: AC
  Filled 2023-11-18: qty 1

## 2023-11-18 MED ORDER — MAGNESIUM SULFATE 2 GM/50ML IV SOLN: 2.0000 g | Freq: Every day | INTRAVENOUS | Status: DC | PRN

## 2023-11-18 MED ORDER — METOPROLOL TARTRATE 5 MG/5ML IV SOLN
2.0000 mg | INTRAVENOUS | Status: DC | PRN
Start: 2023-11-18 — End: 2023-11-20

## 2023-11-18 MED ORDER — HEPARIN BOLUS VIA INFUSION
4000.0000 [IU] | Freq: Once | INTRAVENOUS | Status: AC
  Administered 2023-11-18: 4000 [IU] via INTRAVENOUS
  Filled 2023-11-18: qty 4000

## 2023-11-18 MED ORDER — HYDROMORPHONE HCL 1 MG/ML IJ SOLN
0.5000 mg | INTRAMUSCULAR | Status: DC | PRN
  Administered 2023-11-19: 0.5 mg via INTRAVENOUS
  Filled 2023-11-18: qty 0.5

## 2023-11-18 MED ORDER — SODIUM CHLORIDE 0.9 % IV SOLN: INTRAVENOUS | Status: DC | PRN

## 2023-11-18 MED ORDER — LEVOTHYROXINE SODIUM 75 MCG PO TABS
75.0000 ug | ORAL_TABLET | Freq: Every morning | ORAL | Status: DC
  Administered 2023-11-19: 75 ug via ORAL
  Filled 2023-11-18: qty 1

## 2023-11-18 MED ORDER — DEXMEDETOMIDINE HCL IN NACL 80 MCG/20ML IV SOLN
INTRAVENOUS | Status: DC | PRN
  Administered 2023-11-18: 8 ug via INTRAVENOUS

## 2023-11-18 MED ORDER — ATORVASTATIN CALCIUM 10 MG PO TABS
20.0000 mg | ORAL_TABLET | Freq: Every day | ORAL | Status: DC
  Administered 2023-11-18: 20 mg via ORAL
  Filled 2023-11-18: qty 2

## 2023-11-18 MED ORDER — IOHEXOL 350 MG/ML SOLN
100.0000 mL | Freq: Once | INTRAVENOUS | Status: AC | PRN
  Administered 2023-11-18: 100 mL via INTRAVENOUS

## 2023-11-18 MED ORDER — ONDANSETRON HCL 4 MG/2ML IJ SOLN: 4.0000 mg | Freq: Four times a day (QID) | INTRAMUSCULAR | Status: DC | PRN

## 2023-11-18 MED ORDER — ASPIRIN 81 MG PO TBEC
81.0000 mg | DELAYED_RELEASE_TABLET | Freq: Every day | ORAL | Status: DC
  Administered 2023-11-19: 81 mg via ORAL
  Filled 2023-11-18: qty 1

## 2023-11-18 MED ORDER — DEXAMETHASONE SODIUM PHOSPHATE 10 MG/ML IJ SOLN
INTRAMUSCULAR | Status: DC | PRN
  Administered 2023-11-18: 5 mg via INTRAVENOUS

## 2023-11-18 MED ORDER — IODIXANOL 320 MG/ML IV SOLN
INTRAVENOUS | Status: DC | PRN
  Administered 2023-11-18: 95 mL via INTRA_ARTERIAL

## 2023-11-18 MED ORDER — PROPOFOL 10 MG/ML IV BOLUS
INTRAVENOUS | Status: AC
  Filled 2023-11-18: qty 20

## 2023-11-18 MED ORDER — LIDOCAINE 2% (20 MG/ML) 5 ML SYRINGE
INTRAMUSCULAR | Status: AC
  Filled 2023-11-18: qty 5

## 2023-11-18 MED ORDER — ORAL CARE MOUTH RINSE: 15.0000 mL | Freq: Once | OROMUCOSAL | Status: AC

## 2023-11-18 MED ORDER — LACTATED RINGERS IV SOLN: INTRAVENOUS | Status: DC

## 2023-11-18 MED ORDER — SODIUM CHLORIDE 0.9 % IV SOLN: 500.0000 mL | Freq: Once | INTRAVENOUS | Status: DC | PRN

## 2023-11-18 MED ORDER — PROPOFOL 10 MG/ML IV BOLUS
INTRAVENOUS | Status: DC | PRN
  Administered 2023-11-18: 50 mg via INTRAVENOUS
  Administered 2023-11-18: 100 mg via INTRAVENOUS
  Administered 2023-11-18 (×2): 10 mg via INTRAVENOUS

## 2023-11-18 MED ORDER — AMLODIPINE BESYLATE 10 MG PO TABS
10.0000 mg | ORAL_TABLET | Freq: Every day | ORAL | Status: DC
  Administered 2023-11-19: 10 mg via ORAL
  Filled 2023-11-18: qty 1

## 2023-11-18 MED ORDER — PANTOPRAZOLE SODIUM 40 MG PO TBEC
40.0000 mg | DELAYED_RELEASE_TABLET | Freq: Every day | ORAL | Status: DC
  Administered 2023-11-18 – 2023-11-19 (×2): 40 mg via ORAL
  Filled 2023-11-18 (×2): qty 1

## 2023-11-18 MED ORDER — LABETALOL HCL 5 MG/ML IV SOLN
10.0000 mg | INTRAVENOUS | Status: DC | PRN
  Administered 2023-11-19: 10 mg via INTRAVENOUS
  Filled 2023-11-18: qty 4

## 2023-11-18 MED ORDER — LABETALOL HCL 5 MG/ML IV SOLN
INTRAVENOUS | Status: DC | PRN
  Administered 2023-11-18: 5 mg via INTRAVENOUS

## 2023-11-18 MED ORDER — HEPARIN SODIUM (PORCINE) 1000 UNIT/ML IJ SOLN
INTRAMUSCULAR | Status: DC | PRN
  Administered 2023-11-18 (×2): 2000 [IU] via INTRAVENOUS
  Administered 2023-11-18: 8000 [IU] via INTRAVENOUS

## 2023-11-18 MED ORDER — HEPARIN (PORCINE) 25000 UT/250ML-% IV SOLN
1250.0000 [IU]/h | INTRAVENOUS | Status: DC
  Administered 2023-11-18: 1250 [IU]/h via INTRAVENOUS
  Filled 2023-11-18: qty 250

## 2023-11-18 MED ORDER — ONDANSETRON HCL 4 MG/2ML IJ SOLN
INTRAMUSCULAR | Status: DC | PRN
  Administered 2023-11-18: 4 mg via INTRAVENOUS

## 2023-11-18 MED ORDER — ALUM & MAG HYDROXIDE-SIMETH 200-200-20 MG/5ML PO SUSP: 15.0000 mL | ORAL | Status: DC | PRN

## 2023-11-18 MED ORDER — CLOPIDOGREL BISULFATE 75 MG PO TABS
75.0000 mg | ORAL_TABLET | Freq: Every day | ORAL | Status: DC
  Administered 2023-11-19: 75 mg via ORAL
  Filled 2023-11-18: qty 1

## 2023-11-18 MED ORDER — HEPARIN 6000 UNIT IRRIGATION SOLUTION
Status: DC | PRN
  Administered 2023-11-18: 1

## 2023-11-18 MED ORDER — FENTANYL CITRATE (PF) 250 MCG/5ML IJ SOLN
INTRAMUSCULAR | Status: AC
  Filled 2023-11-18: qty 5

## 2023-11-18 MED ORDER — DOCUSATE SODIUM 100 MG PO CAPS
100.0000 mg | ORAL_CAPSULE | Freq: Every day | ORAL | Status: DC
  Administered 2023-11-19: 100 mg via ORAL
  Filled 2023-11-18: qty 1

## 2023-11-18 MED ORDER — POTASSIUM CHLORIDE CRYS ER 20 MEQ PO TBCR
20.0000 meq | EXTENDED_RELEASE_TABLET | Freq: Every day | ORAL | Status: DC | PRN
Start: 2023-11-18 — End: 2023-11-20

## 2023-11-18 MED ORDER — DROPERIDOL 2.5 MG/ML IJ SOLN: 0.6250 mg | Freq: Once | INTRAMUSCULAR | Status: DC | PRN

## 2023-11-18 MED ORDER — CEFAZOLIN SODIUM-DEXTROSE 2-3 GM-%(50ML) IV SOLR
INTRAVENOUS | Status: DC | PRN
  Administered 2023-11-18: 2 g via INTRAVENOUS

## 2023-11-18 MED ORDER — LIDOCAINE 2% (20 MG/ML) 5 ML SYRINGE
INTRAMUSCULAR | Status: DC | PRN
  Administered 2023-11-18: 80 mg via INTRAVENOUS

## 2023-11-18 MED ORDER — 0.9 % SODIUM CHLORIDE (POUR BTL) OPTIME
TOPICAL | Status: DC | PRN
  Administered 2023-11-18: 2000 mL

## 2023-11-18 MED ORDER — FENTANYL CITRATE (PF) 250 MCG/5ML IJ SOLN
INTRAMUSCULAR | Status: DC | PRN
  Administered 2023-11-18 (×5): 50 ug via INTRAVENOUS

## 2023-11-18 MED ORDER — FENTANYL CITRATE (PF) 100 MCG/2ML IJ SOLN
INTRAMUSCULAR | Status: AC
  Filled 2023-11-18: qty 2

## 2023-11-18 MED ORDER — ACETAMINOPHEN 325 MG PO TABS: 325.0000 mg | ORAL_TABLET | ORAL | Status: DC | PRN

## 2023-11-18 MED ORDER — CEFAZOLIN SODIUM-DEXTROSE 2-4 GM/100ML-% IV SOLN
2.0000 g | Freq: Three times a day (TID) | INTRAVENOUS | Status: AC
  Administered 2023-11-18 – 2023-11-19 (×2): 2 g via INTRAVENOUS
  Filled 2023-11-18 (×2): qty 100

## 2023-11-18 MED ORDER — OXYCODONE HCL 5 MG PO TABS
5.0000 mg | ORAL_TABLET | ORAL | Status: DC | PRN
Start: 2023-11-18 — End: 2023-11-20
  Administered 2023-11-18 – 2023-11-19 (×2): 5 mg via ORAL
  Filled 2023-11-18 (×2): qty 1

## 2023-11-18 MED ORDER — SODIUM CHLORIDE 0.9 % IV SOLN: INTRAVENOUS | Status: DC

## 2023-11-18 MED ORDER — PHENOL 1.4 % MT LIQD: 1.0000 | OROMUCOSAL | Status: DC | PRN

## 2023-11-18 MED ORDER — SUGAMMADEX SODIUM 200 MG/2ML IV SOLN
INTRAVENOUS | Status: DC | PRN
  Administered 2023-11-18: 400 mg via INTRAVENOUS

## 2023-11-18 MED ORDER — HYDRALAZINE HCL 20 MG/ML IJ SOLN: 5.0000 mg | INTRAMUSCULAR | Status: DC | PRN

## 2023-11-18 MED ORDER — ACETAMINOPHEN 650 MG RE SUPP: 325.0000 mg | RECTAL | Status: DC | PRN

## 2023-11-18 MED ORDER — CHLORHEXIDINE GLUCONATE 0.12 % MT SOLN
15.0000 mL | Freq: Once | OROMUCOSAL | Status: AC
  Administered 2023-11-18: 15 mL via OROMUCOSAL
  Filled 2023-11-18: qty 15

## 2023-11-18 MED ORDER — MIDAZOLAM HCL 2 MG/2ML IJ SOLN
INTRAMUSCULAR | Status: DC | PRN
  Administered 2023-11-18: 2 mg via INTRAVENOUS

## 2023-11-18 MED ORDER — HEPARIN 6000 UNIT IRRIGATION SOLUTION
Status: AC
  Filled 2023-11-18: qty 500

## 2023-11-18 MED ORDER — MIDAZOLAM HCL 2 MG/2ML IJ SOLN
INTRAMUSCULAR | Status: AC
  Filled 2023-11-18: qty 2

## 2023-11-18 MED ORDER — ALBUTEROL SULFATE (2.5 MG/3ML) 0.083% IN NEBU: 2.5000 mg | INHALATION_SOLUTION | Freq: Four times a day (QID) | RESPIRATORY_TRACT | Status: DC | PRN

## 2023-11-18 MED ORDER — PHENYLEPHRINE 80 MCG/ML (10ML) SYRINGE FOR IV PUSH (FOR BLOOD PRESSURE SUPPORT)
PREFILLED_SYRINGE | INTRAVENOUS | Status: DC | PRN
  Administered 2023-11-18 (×2): 80 ug via INTRAVENOUS

## 2023-11-18 MED ORDER — PHENYLEPHRINE 80 MCG/ML (10ML) SYRINGE FOR IV PUSH (FOR BLOOD PRESSURE SUPPORT)
PREFILLED_SYRINGE | INTRAVENOUS | Status: AC
  Filled 2023-11-18: qty 10

## 2023-11-18 MED ORDER — PHENYLEPHRINE HCL-NACL 20-0.9 MG/250ML-% IV SOLN
INTRAVENOUS | Status: DC | PRN
  Administered 2023-11-18: 20 ug/min via INTRAVENOUS

## 2023-11-18 MED ORDER — OXYCODONE HCL 5 MG PO TABS: 5.0000 mg | ORAL_TABLET | Freq: Once | ORAL | Status: DC | PRN

## 2023-11-18 MED ORDER — OXYCODONE HCL 5 MG/5ML PO SOLN
5.0000 mg | Freq: Once | ORAL | Status: DC | PRN
Start: 2023-11-18 — End: 2023-11-18

## 2023-11-18 SURGICAL SUPPLY — 73 items
BAG COUNTER SPONGE SURGICOUNT (BAG) ×4 IMPLANT
BAG SNAP BAND KOVER 36X36 (MISCELLANEOUS) ×1 IMPLANT
BANDAGE ESMARK 6X9 LF (GAUZE/BANDAGES/DRESSINGS) IMPLANT
BENZOIN TINCTURE PRP APPL 2/3 (GAUZE/BANDAGES/DRESSINGS) ×1 IMPLANT
BNDG ESMARK 6X9 LF (GAUZE/BANDAGES/DRESSINGS) IMPLANT
CANISTER SUCT 3000ML PPV (MISCELLANEOUS) ×4 IMPLANT
CANNULA VESSEL 3MM 2 BLNT TIP (CANNULA) ×8 IMPLANT
CATH EMB 4FR 40 (CATHETERS) ×1 IMPLANT
CATH OMNI FLUSH .035X70CM (CATHETERS) ×1 IMPLANT
CATH QUICKCROSS SUPP .035X90CM (MICROCATHETER) ×2 IMPLANT
CHLORAPREP W/TINT 26 (MISCELLANEOUS) ×8 IMPLANT
CLOSURE PERCLOSE PROSTYLE (VASCULAR PRODUCTS) ×2 IMPLANT
COVER DOME SNAP 22 D (MISCELLANEOUS) ×1 IMPLANT
COVER PROBE CYLINDRICAL 5X96 (MISCELLANEOUS) ×1 IMPLANT
CUFF TOURN SGL QUICK 42 (TOURNIQUET CUFF) IMPLANT
CUFF TRNQT CYL 24X4X16.5-23 (TOURNIQUET CUFF) IMPLANT
CUFF TRNQT CYL 34X4.125X (TOURNIQUET CUFF) IMPLANT
DRAIN CHANNEL 15F RND FF W/TCR (WOUND CARE) IMPLANT
DRAIN PENROSE 12X.25 LTX STRL (MISCELLANEOUS) IMPLANT
DRAPE C-ARM 42X72 X-RAY (DRAPES) IMPLANT
DRAPE HALF SHEET 40X57 (DRAPES) IMPLANT
DRAPE X-RAY CASS 24X20 (DRAPES) IMPLANT
DRSG COVADERM 4X6 (GAUZE/BANDAGES/DRESSINGS) ×1 IMPLANT
DRSG TEGADERM 2-3/8X2-3/4 SM (GAUZE/BANDAGES/DRESSINGS) ×1 IMPLANT
ELECT REM PT RETURN 9FT ADLT (ELECTROSURGICAL) ×4 IMPLANT
ELECTRODE REM PT RTRN 9FT ADLT (ELECTROSURGICAL) ×4 IMPLANT
EVACUATOR SILICONE 100CC (DRAIN) IMPLANT
GAUZE SPONGE 2X2 STRL 8-PLY (GAUZE/BANDAGES/DRESSINGS) ×1 IMPLANT
GAUZE SPONGE 4X4 12PLY STRL (GAUZE/BANDAGES/DRESSINGS) ×4 IMPLANT
GLIDEWIRE ADV .035X180CM (WIRE) ×1 IMPLANT
GLOVE BIO SURGEON STRL SZ8 (GLOVE) ×4 IMPLANT
GOWN STRL REUS W/ TWL LRG LVL3 (GOWN DISPOSABLE) ×8 IMPLANT
GOWN STRL REUS W/ TWL XL LVL3 (GOWN DISPOSABLE) ×4 IMPLANT
HEAD CUTTING VALVULOTOME LEMTR (VASCULAR PRODUCTS) IMPLANT
INSERT FOGARTY SM (MISCELLANEOUS) IMPLANT
KIT BASIN OR (CUSTOM PROCEDURE TRAY) ×4 IMPLANT
KIT ENCORE 26 ADVANTAGE (KITS) ×1 IMPLANT
KIT TURNOVER KIT B (KITS) ×4 IMPLANT
MARKER GRAFT CORONARY BYPASS (MISCELLANEOUS) IMPLANT
NS IRRIG 1000ML POUR BTL (IV SOLUTION) ×8 IMPLANT
PACK PERIPHERAL VASCULAR (CUSTOM PROCEDURE TRAY) ×4 IMPLANT
PAD ARMBOARD 7.5X6 YLW CONV (MISCELLANEOUS) ×8 IMPLANT
SET COLLECT BLD 21X3/4 12 (NEEDLE) IMPLANT
SET MICROPUNCTURE 5F STIFF (MISCELLANEOUS) ×1 IMPLANT
SET WALTER ACTIVATION W/DRAPE (SET/KITS/TRAYS/PACK) IMPLANT
SHEATH GUIDING 7F 55X73X9MM TD (SHEATH) ×1 IMPLANT
SHEATH PINNACLE 5F 10CM (SHEATH) ×1 IMPLANT
SHEATH PINNACLE 6F 10CM (SHEATH) ×1 IMPLANT
STENT VIABAHN VBX 7X59X135 (Permanent Stent) ×1 IMPLANT
STENT VIABAHNBX 7X29X135 (Permanent Stent) ×1 IMPLANT
STOPCOCK 4 WAY LG BORE MALE ST (IV SETS) IMPLANT
STRIP CLOSURE SKIN 1/2X4 (GAUZE/BANDAGES/DRESSINGS) ×1 IMPLANT
SUT ETHILON 3 0 PS 1 (SUTURE) IMPLANT
SUT MNCRL AB 4-0 PS2 18 (SUTURE) ×8 IMPLANT
SUT PROLENE 5 0 C 1 24 (SUTURE) ×4 IMPLANT
SUT PROLENE 6 0 BV (SUTURE) ×4 IMPLANT
SUT SILK 2 0 SH (SUTURE) ×4 IMPLANT
SUT SILK 3-0 18XBRD TIE 12 (SUTURE) IMPLANT
SUT VIC AB 2-0 CT1 TAPERPNT 27 (SUTURE) ×8 IMPLANT
SUT VIC AB 3-0 SH 27X BRD (SUTURE) ×8 IMPLANT
SYR 20ML LL LF (SYRINGE) ×2 IMPLANT
SYR 3ML LL SCALE MARK (SYRINGE) ×1 IMPLANT
SYR MEDRAD MARK V 150ML (SYRINGE) ×1 IMPLANT
TAPE UMBILICAL 1/8X30 (MISCELLANEOUS) IMPLANT
TOWEL GREEN STERILE (TOWEL DISPOSABLE) ×4 IMPLANT
TRAY FOLEY MTR SLVR 16FR STAT (SET/KITS/TRAYS/PACK) ×4 IMPLANT
TUBING INJECTOR 48 (MISCELLANEOUS) ×1 IMPLANT
UNDERPAD 30X36 HEAVY ABSORB (UNDERPADS AND DIAPERS) ×4 IMPLANT
VALVULOTOME HEAD CUTTING LEMTR (VASCULAR PRODUCTS) IMPLANT
VALVULOTOME LEMAITRE (VASCULAR PRODUCTS) IMPLANT
WATER STERILE IRR 1000ML POUR (IV SOLUTION) ×4 IMPLANT
WIRE BENTSON .035X145CM (WIRE) ×1 IMPLANT
WIRE ROSEN-J .035X260CM (WIRE) ×1 IMPLANT

## 2023-11-18 NOTE — ED Provider Notes (Signed)
 [facility] Provider Note  CSN: [id number] Arrival date & time: 11/18/23 9144  Chief Complaint(s) Leg Pain, Foot Pain, and decrease circulation in foot  HPI Zoe Snyder is a 68 y.o. female here today for a cold the left leg.  Patient says that this has been ongoing for the last couple of weeks where she has noticed increased pain in her leg, worse with ambulation.  She says that over the last couple of days she has noticed that her foot has seemed cold, and become darker in color.  She went to her PCP who put her on prednisone .  Patient has a past medical history significant for COPD, tobacco use, hypothyroidism.   Past Medical History Past Medical History:  Diagnosis Date   COPD (chronic obstructive pulmonary disease) (HCC)    Hashimoto's thyroiditis    History of alcohol abuse 11/30/2017   History of cocaine abuse (HCC) 11/30/2017   History of colon polyps    Hyperlipidemia    Hypertension    Prediabetes    Throat cancer (HCC)    Tobacco use disorder    Patient Active Problem List   Diagnosis Date Noted   Acute respiratory failure with hypoxia (HCC) 09/29/2022   Influenza due to influenza A virus 09/29/2022   COPD with acute exacerbation (HCC) 12/08/2017   Heavy tobacco smoker >10 cigarettes per day 12/08/2017   Unintended weight loss 12/08/2017   Mixed hyperlipidemia 12/08/2017   Hypertension goal BP (blood pressure) < 130/80 12/08/2017   Prediabetes 12/08/2017   Throat cancer (HCC) 12/08/2017   History of cocaine abuse (HCC) 11/30/2017   History of alcohol abuse 11/30/2017   Refused influenza vaccine 11/29/2017   History of colon polyps    Olecranon bursitis of right elbow 02/03/2017   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   Subclinical hypothyroidism 05/06/2016   Muscle cramps 04/29/2016   Malignant neoplasm of larynx (HCC) 01/09/2016   Dysphonia 03/21/2015   Displaced comminuted fracture of shaft of right fibula,  sequela 03/07/2015   Anxiety disorder 01/16/2014   Obesity 10/19/2012   Dry eye syndrome 06/10/2009   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 Authorizing Provider  albuterol  (PROVENTIL ) (2.5 MG/3ML) 0.083% nebulizer solution Inhale 3 mLs (2.5 mg total) by nebulization every 6 (six) hours as needed for wheezing or shortness of breath. 03/16/23   Arloa Suzen RAMAN, NP  albuterol  (VENTOLIN  HFA) 108 (90 Base) MCG/ACT inhaler Inhale 2 puffs into the lungs every 4 (four) hours as needed for wheezing or shortness of breath. 03/16/23   Arloa Suzen RAMAN, NP  amLODipine  (NORVASC ) 10 MG tablet Take 1 tablet (10 mg total) by mouth daily. Due for follow up visit 08/05/21   Freddi Hamilton, MD  atorvastatin  (LIPITOR) 20 MG tablet Take 20 mg by mouth at bedtime.    [provider]  Cholecalciferol  (VITAMIN D -1000 MAX ST) 25 MCG (1000 UT) tablet Take 1 tablet (1,000 Units total) by mouth daily. 08/13/23   Flint Sonny POUR, PA-C  fluticasone  furoate-vilanterol (BREO ELLIPTA ) 100-25 MCG/ACT AEPB Inhale 1 puff into the lungs daily. 10/01/22   Rudy Sieving, MD  ipratropium-albuterol  (DUONEB) 0.5-2.5 (3) MG/3ML SOLN Take 3 mLs by nebulization every 4 (four) hours as needed. 10/22/23   Joesph Shaver Scales, PA-C  levothyroxine  (SYNTHROID )  75 MCG tablet Take 75 mcg by mouth every morning. 06/21/23   [provider]  meloxicam  (MOBIC ) 7.5 MG tablet Take 1 tablet (7.5 mg total) by mouth daily. Patient taking differently: Take 15 mg by mouth daily. 05/22/22   Raspet, Erin K, PA-C  methylPREDNISolone  (MEDROL ) 4 MG tablet Take by mouth. 10/22/23   [provider]  Multiple Vitamin (MULTI-VITAMIN) tablet Take 1 tablet by mouth daily. 05/08/16   [provider]  umeclidinium bromide  (INCRUSE  ELLIPTA) 62.5 MCG/ACT AEPB Inhale 1 puff into the lungs daily. 10/02/22   Guilloud, Carolyn, MD  valsartan (DIOVAN) 80 MG tablet Take 80 mg by mouth daily. 09/29/22   [provider]                                                                                                                                    Past Surgical History Past Surgical History:  Procedure Laterality Date   COLONOSCOPY W/ POLYPECTOMY  2016   THROAT SURGERY  2018   cancer   Family History Family History  Problem Relation Age of Onset   Hypertension Mother    Diabetes Maternal Grandmother    Colon cancer Maternal Aunt     Social History Social History   Tobacco Use   Smoking status: Every Day    Current packs/day: 0.50    Average packs/day: 0.5 packs/day for 45.0 years (22.5 ttl pk-yrs)    Types: Cigarettes   Smokeless tobacco: Never   Tobacco comments:    currently smoking 0.5 ppd, but she has cut down from over 1 ppd  Vaping Use   Vaping status: Never Used  Substance Use Topics   Alcohol use: Not Currently   Drug use: Not Currently    Types: Marijuana   Allergies Epinephrine, Novocain [procaine], and Theraflu severe cold daytime [dm-phenylephrine -acetaminophen ]  Review of Systems Review of Systems  Physical Exam Vital Signs  I have reviewed the triage vital signs BP (!) 156/89   Pulse 87   Temp 98.7 F (37.1 C)   Resp (!) 23   Ht 5' 3 (1.6 m)   Wt 83.9 kg   SpO2 97%   BMI 32.77 kg/m   Physical Exam Vitals reviewed.  Eyes:     Pupils: Pupils are equal, round, and reactive to light.  Cardiovascular:     Rate and Rhythm: Normal rate.     Comments: Dopplerable PT pulse in the left foot Musculoskeletal:     Cervical back: Normal range of motion.  Skin:    Comments: Cool, discoloration of the first and second toe on the left foot  Neurological:     Mental Status: She is alert.     ED Results and Treatments Labs (all labs ordered are listed, but only abnormal  results are displayed) Labs Reviewed  CBC WITH DIFFERENTIAL/PLATELET - Abnormal; Notable for the following components:      Result Value  WBC 12.3 (*)    Neutro Abs 8.3 (*)    Monocytes Absolute 1.2 (*)    Abs Immature Granulocytes 0.12 (*)    All other components within normal limits  COMPREHENSIVE METABOLIC PANEL - Abnormal; Notable for the following components:   Glucose, Bld 114 (*)    AST 14 (*)    All other components within normal limits  TSH  T4, FREE  HEPARIN  LEVEL (UNFRACTIONATED)                                                                                                                          Radiology VAS US  LOWER EXTREMITY VENOUS (DVT) (7a-7p) Result Date: 11/18/2023  Lower Venous DVT Study Patient Name:  NAYANNA SEABORN [facility] [place]  Date of Exam:   11/18/2023 Medical Rec #: [id number]           Accession #:    [id number] Date of Birth: Nov 16, 1955           Patient Gender: F Patient Age:   68 years Exam Location:  [facility] Procedure:      VAS US  LOWER EXTREMITY VENOUS (DVT) Referring Phys: FAIRY Kemisha Bonnette --------------------------------------------------------------------------------  Indications: Pain, and Cold.  Comparison Study: No prior exam. Performing Technologist: Edilia Elden Appl  Examination Guidelines: A complete evaluation includes B-mode imaging, spectral Doppler, color Doppler, and power Doppler as needed of all accessible portions of each vessel. Bilateral testing is considered an integral part of a complete examination. Limited examinations for reoccurring indications may be performed as noted. The reflux portion of the exam is performed with the patient in reverse Trendelenburg.  +-----+---------------+---------+-----------+----------+--------------+ RIGHTCompressibilityPhasicitySpontaneityPropertiesThrombus Aging +-----+---------------+---------+-----------+----------+--------------+ CFV  Full           Yes      Yes                                  +-----+---------------+---------+-----------+----------+--------------+ SFJ  Full           Yes      Yes                                 +-----+---------------+---------+-----------+----------+--------------+   +---------+---------------+---------+-----------+----------+--------------+ LEFT     CompressibilityPhasicitySpontaneityPropertiesThrombus Aging +---------+---------------+---------+-----------+----------+--------------+ CFV      Full           Yes      Yes                                 +---------+---------------+---------+-----------+----------+--------------+ SFJ      Full           Yes      Yes                                 +---------+---------------+---------+-----------+----------+--------------+  FV Prox  Full                                                        +---------+---------------+---------+-----------+----------+--------------+ FV Mid   Partial        Yes      Yes                                 +---------+---------------+---------+-----------+----------+--------------+ FV DistalPartial        Yes      Yes                                 +---------+---------------+---------+-----------+----------+--------------+ PFV      None           Yes      Yes                                 +---------+---------------+---------+-----------+----------+--------------+ POP      Partial                                                     +---------+---------------+---------+-----------+----------+--------------+ PTV      Full                                                        +---------+---------------+---------+-----------+----------+--------------+ PERO     Full                                                        +---------+---------------+---------+-----------+----------+--------------+     Summary: RIGHT: - No evidence of common femoral vein obstruction.   LEFT: - Findings consistent with acute deep vein thrombosis  involving the left femoral vein, left proximal profunda vein, and left popliteal vein.  - No cystic structure found in the popliteal fossa.  *See table(s) above for measurements and observations.    Preliminary    CT ANGIO LOWER EXT BILAT W &/OR WO CONTRAST Result Date: 11/18/2023 CLINICAL DATA:  Claudication or leg ischemia. EXAM: CT ANGIOGRAPHY OF ABDOMINAL AORTA WITH ILIOFEMORAL RUNOFF TECHNIQUE: Multidetector CT imaging of the abdomen, pelvis and lower extremities was performed using the standard protocol during bolus administration of intravenous contrast. Multiplanar CT image reconstructions and MIPs were obtained to evaluate the vascular anatomy. RADIATION DOSE REDUCTION: This exam was performed according to the departmental dose-optimization program which includes automated exposure control, adjustment of the mA and/or kV according to patient size and/or use of iterative reconstruction technique. CONTRAST:  OMNIPAQUE  IOHEXOL  350 MG/ML SOLN COMPARISON:  CT of the abdomen pelvis dated 01/18/2021. FINDINGS: VASCULAR Aorta: There is atherosclerotic calcification of the visualized distal aorta. Celiac: Not included in the images.  SMA: Not included in the images. Renals: Not included in the images. IMA: The origin of the IMA appears patent. There is however very diminished flow within the IMA distally. RIGHT Lower Extremity Inflow: There is advanced atherosclerotic calcification of the iliac arteries with luminal narrowing. The iliac arteries remain patent. Outflow: The common femoral artery is patent. The deep femoral artery is patent. There is moderate atherosclerotic calcification of the superficial femoral artery with luminal narrowing. The SFA remains patent. The popliteal artery is patent. Runoff: There is atherosclerotic calcification of the calf arteries. The anterior and posterior tibial arteries appear patent to the level of the ankle. The fibular artery appears patent proximally. The distal  fibular artery is poorly opacified. LEFT Lower Extremity Inflow: There is atherosclerotic calcification of the left common femoral artery. There is occlusive clot in the left common femoral artery. There is reconstitution of flow in the distal left common iliac artery just above the bifurcation. There is advanced atherosclerotic calcification of the external and internal iliac arteries with luminal narrowing. The internal and external iliac arteries appear patent but with very diminished flow. Outflow: There is diffuse atherosclerotic calcification and luminal narrowing. The left common femoral artery and deep femoral artery appear patent. The SFA and the popliteal artery are patent. Runoff: Evaluation of the calf arteries is limited due to suboptimal opacification and contrast washout. The anterior and posterior tibial arteries appear patent to the level of the calf. The fibular artery appears patent proximally but with very diminished flow. Veins: No obvious venous abnormality within the limitations of this arterial phase study. Review of the MIP images confirms the above findings. NON-VASCULAR Adrenals/Urinary Tract: The urinary bladder is minimally distended and grossly unremarkable. Stomach/Bowel: There is sigmoid diverticulosis. No bowel dilatation in the pelvis. Lymphatic: No pelvic adenopathy. Reproductive: The uterus is anteverted and grossly unremarkable. No suspicious adnexal masses. Other: None Musculoskeletal: No acute osseous pathology. IMPRESSION: 1. Occlusive clot in the left common femoral artery with reconstitution of flow in the distal left common iliac artery just above the bifurcation. 2. Diffuse atherosclerotic calcification and luminal narrowing of the bilateral lower extremity arteries. 3. Sigmoid diverticulosis. 4.  Aortic Atherosclerosis (ICD10-I70.0). Electronically Signed   By: Vanetta Chou M.D.   On: 11/18/2023 11:30    Pertinent labs & imaging results that were available during  my care of the patient were reviewed by me and considered in my medical decision making (see MDM for details).  Medications Ordered in ED Medications  heparin  bolus via infusion 4,000 Units (has no administration in time range)  heparin  ADULT infusion 100 units/mL (25000 units/250mL) (has no administration in time range)  iohexol  (OMNIPAQUE ) 350 MG/ML injection 100 mL (100 mLs Intravenous Contrast Given 11/18/23 1039)  Procedures .Critical Care  Performed by: Mannie Fairy DASEN, DO Authorized by: Mannie Fairy DASEN, DO   Critical care provider statement:    Critical care time (minutes):  30   Critical care was necessary to treat or prevent imminent or life-threatening deterioration of the following conditions:  Circulatory failure   Critical care was time spent personally by me on the following activities:  Development of treatment plan with patient or surrogate, discussions with consultants, evaluation of patient's response to treatment, examination of patient, ordering and review of laboratory studies, ordering and review of radiographic studies, ordering and performing treatments and interventions, pulse oximetry, re-evaluation of patient's condition and review of old charts   (including critical care time)  Medical Decision Making / ED Course   This patient presents to the ED for concern of cool left leg, this involves an extensive number of treatment options, and is a complaint that carries with it a high risk of complications and morbidity.  The differential diagnosis includes claudication, peripheral vascular disease, DVT.  MDM: Patient certainly has some degree of vascular insufficiency in the left lower extremity.  Was able to identify pulses on bedside ultrasound, however foot cool, some mild discoloration.  Ultrasound and CTA ordered.  Anticipate  vascular surgery consultation for what I believe would likely be a high degree of claudication.  Reassessment 12:15 PM-patient's CT angiography did show an occlusive clot in the common femoral artery.  Was not contacted regarding this finding.  Ordered heparin  for the patient, placed vascular surgery consult.  Patient's leg exam remains unchanged.  She is not having worsening pain.  Ultrasound tech was leaving the room and reported that she identified sporadic clotting throughout the patient's vasculature in the veins of the left leg.  Formal report pending, images not yet available.  12:45 PM-spoke with vascular surgery, they agree with heparin , and they are preparing to take her to the OR for thrombectomy.  They have evaluated the patient.    Additional history obtained: -Additional history obtained from  -External records from outside source obtained and reviewed including: Chart review including previous notes, labs, imaging, consultation notes   Lab Tests: -I ordered, reviewed, and interpreted labs.   The pertinent results include:   Labs Reviewed  CBC WITH DIFFERENTIAL/PLATELET - Abnormal; Notable for the following components:      Result Value   WBC 12.3 (*)    Neutro Abs 8.3 (*)    Monocytes Absolute 1.2 (*)    Abs Immature Granulocytes 0.12 (*)    All other components within normal limits  COMPREHENSIVE METABOLIC PANEL - Abnormal; Notable for the following components:   Glucose, Bld 114 (*)    AST 14 (*)    All other components within normal limits  TSH  T4, FREE  HEPARIN  LEVEL (UNFRACTIONATED)      EKG   EKG Interpretation Date/Time:    Ventricular Rate:    PR Interval:    QRS Duration:    QT Interval:    QTC Calculation:   R Axis:      Text Interpretation:           Imaging Studies ordered: I ordered imaging studies including ultrasound, CT imaging I independently visualized and interpreted imaging. I agree with the radiologist  interpretation   Medicines ordered and prescription drug management: Meds ordered this encounter  Medications   iohexol  (OMNIPAQUE ) 350 MG/ML injection 100 mL   heparin  bolus via infusion 4,000 Units   heparin  ADULT infusion 100 units/mL (25000  units/271mL)    -I have reviewed the patients home medicines and have made adjustments as needed  Critical interventions Management of limb ischemia  Consultations Obtained: I requested consultation with the vascular surgery team Dr. Vonzell,  and discussed lab and imaging findings as well as pertinent plan - they recommend: Admission   Cardiac Monitoring: The patient was maintained on a cardiac monitor.  I personally viewed and interpreted the cardiac monitored which showed an underlying rhythm of: Sinus rhythm  Social Determinants of Health:  Factors impacting patients care include: Chronic tobacco use   Reevaluation: After the interventions noted above, I reevaluated the patient and found that they have :improved  Co morbidities that complicate the patient evaluation  Past Medical History:  Diagnosis Date   COPD (chronic obstructive pulmonary disease) (HCC)    Hashimoto's thyroiditis    History of alcohol abuse 11/30/2017   History of cocaine abuse (HCC) 11/30/2017   History of colon polyps    Hyperlipidemia    Hypertension    Prediabetes    Throat cancer (HCC)    Tobacco use disorder       Dispostion: Admission to vascular surgery    Final Clinical Impression(s) / ED Diagnoses Final diagnoses:  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

## 2023-11-18 NOTE — ED Notes (Signed)
 CT called, able to come to CT 2

## 2023-11-18 NOTE — Progress Notes (Signed)
 PHARMACY - ANTICOAGULATION CONSULT NOTE  Pharmacy Consult for heparin  Indication: DVT  Allergies  Allergen Reactions   Epinephrine Anaphylaxis    Respiratory problems, e.g., wheezing; Palpitations  Respiratory problems, e.g., wheezing; Palpitations    Novocain [Procaine] Palpitations and Other (See Comments)    Heart racing   Theraflu Severe Cold Daytime [Dm-Phenylephrine -Acetaminophen ] Palpitations    Patient Measurements: Height: 5' 3 (160 cm) Weight: 83.9 kg (185 lb) IBW/kg (Calculated) : 52.4 Heparin  Dosing Weight: 71 Kg  Vital Signs: Temp: 98 F (36.7 C) (02/06 2034) Temp Source: Oral (02/06 2034) BP: 138/103 (02/06 2034) Pulse Rate: 69 (02/06 2034)  Labs: Recent Labs    11/18/23 0932  HGB 14.3  HCT 43.0  PLT 272  CREATININE 1.00    Estimated Creatinine Clearance: 56 mL/min (by C-G formula based on SCr of 1 mg/dL).   Medical History: Past Medical History:  Diagnosis Date   COPD (chronic obstructive pulmonary disease) (HCC)    Hashimoto's thyroiditis    History of alcohol abuse 11/30/2017   History of cocaine abuse (HCC) 11/30/2017   History of colon polyps    Hyperlipidemia    Hypertension    Prediabetes    Throat cancer (HCC)    Tobacco use disorder     Assessment: 69 yoF with left leg pain found to have arteriole occlusion no oral anticoagulation reported prior to admission. Pharmacy consulted to start heparin . Vascular consult pending for possible intervention. CBC WNL  PM update: heparin  stopped at 15:34 for thrombectomy. Per [facility], resume heparin  drip tonight at 23:00.   Goal of Therapy:  Heparin  level 0.3-0.7 units/ml Monitor platelets by anticoagulation protocol: Yes   Plan:  AT 23:00 Resume heparin  infusion at 1250 units/hr Check anti-Xa level in 8 hours and daily while on heparin  Continue to monitor H&H and platelets  Rocky Slade, PharmD, BCPS Clinical Pharmacist 11/18/2023 8:56 PM Please check AMION for all [facility]  numbers

## 2023-11-18 NOTE — Anesthesia Procedure Notes (Signed)
 Arterial Line Insertion Start/End2/03/2024 3:40 PM, 11/18/2023 3:50 PM Performed by: Erma Thom SAUNDERS, MD, anesthesiologist  Patient location: OR. Preanesthetic checklist: patient identified, IV checked, site marked, risks and benefits discussed, surgical consent, monitors and equipment checked, pre-op evaluation, timeout performed and anesthesia consent Right, radial was placed Catheter size: 20 G Hand hygiene performed  and maximum sterile barriers used   Attempts: 1 Procedure performed using ultrasound guided technique. Ultrasound Notes:anatomy identified, needle tip was noted to be adjacent to the nerve/plexus identified and no ultrasound evidence of intravascular and/or intraneural injection Following insertion, dressing applied. Post procedure assessment: normal and unchanged  Patient tolerated the procedure well with no immediate complications. Additional procedure comments: No ultrasound image was able to be saved.

## 2023-11-18 NOTE — Anesthesia Preprocedure Evaluation (Addendum)
 Anesthesia Evaluation  Patient identified by MRN, date of birth, ID band Patient awake    Reviewed: Allergy & Precautions, H&P , NPO status , Patient's Chart, lab work & pertinent test results  Airway Mallampati: II  TM Distance: >3 FB Neck ROM: Full    Dental  (+) Edentulous Upper, Edentulous Lower   Pulmonary sleep apnea , COPD, Current Smoker   Pulmonary exam normal breath sounds clear to auscultation       Cardiovascular hypertension, Normal cardiovascular exam Rhythm:Regular Rate:Normal  Presenting for left leg thrombectomy   Neuro/Psych  PSYCHIATRIC DISORDERS Anxiety     negative neurological ROS     GI/Hepatic negative GI ROS,,,(+) Hepatitis -  Endo/Other  Hypothyroidism    Renal/GU negative Renal ROS  negative genitourinary   Musculoskeletal negative musculoskeletal ROS (+)    Abdominal   Peds negative pediatric ROS (+)  Hematology negative hematology ROS (+)   Anesthesia Other Findings Hx of larygeal cancer. Last fiberoptic exam per ENT with small polyp, but otherwise unremarkable:   The posterior soft palate, uvula, tongue base and vallecula were visualized and appeared healthy without mucosal masses or lesions. The epiglottis, aryepiglottic folds, hypopharynx, supraglottis, glottis were visualized and appeared healthy without mucosal masses or lesions. There is diffuse mild edema. Vocal fold mobility was intact and symmetric. There is a smooth vocal cord polyp on the left cord.  Reproductive/Obstetrics negative OB ROS                             Anesthesia Physical Anesthesia Plan  ASA: 3  Anesthesia Plan: General   Post-op Pain Management:    Induction: Intravenous  PONV Risk Score and Plan: 2 and Ondansetron  and Dexamethasone   Airway Management Planned: Oral ETT and Video Laryngoscope Planned  Additional Equipment: Arterial line  Intra-op Plan:   Post-operative  Plan: Extubation in OR  Informed Consent: I have reviewed the patients History and Physical, chart, labs and discussed the procedure including the risks, benefits and alternatives for the proposed anesthesia with the patient or authorized representative who has indicated his/her understanding and acceptance.     Dental advisory given  Plan Discussed with: CRNA  Anesthesia Plan Comments:         Anesthesia Quick Evaluation

## 2023-11-18 NOTE — ED Notes (Signed)
 Second page to Vascular Surgery , PA also called no call back yet

## 2023-11-18 NOTE — Anesthesia Procedure Notes (Signed)
 Procedure Name: Intubation Date/Time: 11/18/2023 3:46 PM  Performed by: Christopher Comings, CRNAPre-anesthesia Checklist: Patient identified, Emergency Drugs available, Suction available and Patient being monitored Patient Re-evaluated:Patient Re-evaluated prior to induction Oxygen Delivery Method: Circle system utilized Preoxygenation: Pre-oxygenation with 100% oxygen Induction Type: IV induction Ventilation: Mask ventilation without difficulty and Oral airway inserted - appropriate to patient size Laryngoscope Size: Mac and 4 Grade View: Grade I Tube type: Oral Tube size: 7.0 mm Number of attempts: 1 Airway Equipment and Method: Stylet and Oral airway Placement Confirmation: ETT inserted through vocal cords under direct vision, positive ETCO2 and breath sounds checked- equal and bilateral Secured at: 22 cm Tube secured with: Tape Dental Injury: Teeth and Oropharynx as per pre-operative assessment

## 2023-11-18 NOTE — Consult Note (Signed)
 [facility]  ASSESSMENT / PLAN: 68 y.o. female with left leg acute limb ischemia x 2 weeks. [place] 1 symptoms. 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 Plan thrombectomy in OR as soon as possible.  CHIEF COMPLAINT: Left leg pain x 2 weeks  HISTORY OF PRESENT ILLNESS: Zoe Snyder is a 68 y.o. female who presents to [facility], ER for evaluation of left leg pain over the course of 2 weeks.  Patient reports pain in the foot.  This pain is manageable when she is resting or laying flat.  When she moves, she reports severe cramping discomfort in the muscles of her legs and severe pain in her foot.  She works on her feet 12 hours at a time and has not been able to work.  She has noticed ischemic skin changes to the toes of the left foot.  She can move her foot.  She has sensation in her foot.  We reviewed her workup to date.  I reviewed my recommendation for surgery.  Past Medical History:  Diagnosis Date   COPD (chronic obstructive pulmonary disease) (HCC)    Hashimoto's thyroiditis    History of alcohol abuse 11/30/2017   History of cocaine abuse (HCC) 11/30/2017   History of colon polyps    Hyperlipidemia    Hypertension    Prediabetes    Throat cancer (HCC)    Tobacco use disorder     Past Surgical History:  Procedure Laterality Date   COLONOSCOPY W/ POLYPECTOMY  2016   THROAT SURGERY  2018   cancer    Family History  Problem Relation Age of Onset   Hypertension Mother    Diabetes Maternal Grandmother    Colon cancer Maternal Aunt     Social History   Socioeconomic History   Marital status: Single    Spouse name: Not on file   Number of children: Not on file   Years of education: Not on file   Highest education level: Not on file  Occupational History   Not on file  Tobacco Use   Smoking status: Every Day    Current packs/day: 0.50    Average packs/day: 0.5 packs/day for 45.0 years (22.5 ttl pk-yrs)    Types: Cigarettes    Smokeless tobacco: Never   Tobacco comments:    currently smoking 0.5 ppd, but she has cut down from over 1 ppd  Vaping Use   Vaping status: Never Used  Substance and Sexual Activity   Alcohol use: Not Currently   Drug use: Not Currently    Types: Marijuana   Sexual activity: Not Currently  Other Topics Concern   Not on file  Social History Narrative   Not on file   Social Drivers of Health   Financial Resource Strain: Not on File (06/05/2018)   Received from [employer], [employer]    Financial Resource Strain: 0  Food Insecurity: No Food Insecurity (09/29/2022)   Hunger Vital Sign    Worried About Running Out of Food in the Last Year: Never true    Ran Out of Food in the Last Year: Never true  Transportation Needs: No Transportation Needs (09/29/2022)   PRAPARE - Administrator, Civil Service (Medical): No    Lack of Transportation (Non-Medical): No  Physical Activity: Not on File (06/05/2018)   Received from [place], [place]   Physical Activity    Physical Activity: 0  Stress: Not on File (06/05/2018)  Received from [place], [place]   Stress    Stress: 0  Social Connections: Not on File (06/05/2018)   Received from [place], [place]   Social Connections    Social Connections and Isolation: 0  Intimate Partner Violence: Not At Risk (09/29/2022)   Humiliation, Afraid, Rape, and Kick questionnaire    Fear of Current or Ex-Partner: No    Emotionally Abused: No    Physically Abused: No    Sexually Abused: No    Allergies  Allergen Reactions   Epinephrine Anaphylaxis    Respiratory problems, e.g., wheezing; Palpitations  Respiratory problems, e.g., wheezing; Palpitations    Novocain [Procaine] Palpitations and Other (See Comments)    Heart racing   Theraflu Severe Cold Daytime [Dm-Phenylephrine -Acetaminophen ] Palpitations    Current Facility-Administered Medications  Medication Dose Route Frequency Provider Last Rate Last Admin   heparin   ADULT infusion 100 units/mL (25000 units/250mL)  1,250 Units/hr Intravenous Continuous Mannie Pac T, DO       heparin  bolus via infusion 4,000 Units  4,000 Units Intravenous Once Mannie Pac DASEN, DO       Current Outpatient Medications  Medication Sig Dispense Refill   albuterol  (PROVENTIL ) (2.5 MG/3ML) 0.083% nebulizer solution Inhale 3 mLs (2.5 mg total) by nebulization every 6 (six) hours as needed for wheezing or shortness of breath. 270 mL 0   albuterol  (VENTOLIN  HFA) 108 (90 Base) MCG/ACT inhaler Inhale 2 puffs into the lungs every 4 (four) hours as needed for wheezing or shortness of breath. 8 g 0   amLODipine  (NORVASC ) 10 MG tablet Take 1 tablet (10 mg total) by mouth daily. Due for follow up visit 30 tablet 1   atorvastatin  (LIPITOR) 20 MG tablet Take 20 mg by mouth at bedtime.     Cholecalciferol  (VITAMIN D -1000 MAX ST) 25 MCG (1000 UT) tablet Take 1 tablet (1,000 Units total) by mouth daily. 12 tablet 0   fluticasone  furoate-vilanterol (BREO ELLIPTA ) 100-25 MCG/ACT AEPB Inhale 1 puff into the lungs daily. 60 each 0   ipratropium-albuterol  (DUONEB) 0.5-2.5 (3) MG/3ML SOLN Take 3 mLs by nebulization every 4 (four) hours as needed. 360 mL 1   levothyroxine  (SYNTHROID ) 75 MCG tablet Take 75 mcg by mouth every morning.     meloxicam  (MOBIC ) 7.5 MG tablet Take 1 tablet (7.5 mg total) by mouth daily. (Patient taking differently: Take 15 mg by mouth daily.) 10 tablet 0   methylPREDNISolone  (MEDROL ) 4 MG tablet Take by mouth.     Multiple Vitamin (MULTI-VITAMIN) tablet Take 1 tablet by mouth daily.     umeclidinium bromide  (INCRUSE ELLIPTA ) 62.5 MCG/ACT AEPB Inhale 1 puff into the lungs daily. 30 each 0   valsartan (DIOVAN) 80 MG tablet Take 80 mg by mouth daily.      PHYSICAL EXAM Vitals:   11/18/23 0912 11/18/23 1127 11/18/23 1212  BP: (!) 165/118  (!) 156/89  Pulse: 96  87  Resp: 18  (!) 23  Temp: 99 F (37.2 C)  98.7 F (37.1 C)  SpO2: 95%  97%  Weight:  83.9 kg   Height:   5' 3 (1.6 m)    Elderly obese woman in no distress Regular rate and rhythm Unlabored breathing No left femoral pulse Doppler flow in the left foot better than expected based on CT scan Ischemic skin changes to the toes of the left foot Patient able to dorsi and plantarflex the toes and ankle Patient reports sensation to light touch throughout the left foot  PERTINENT LABORATORY AND RADIOLOGIC DATA  Most recent CBC    Latest Ref Rng & Units 11/18/2023    9:32 AM 11/06/2023   11:30 AM 08/13/2023    9:14 AM  CBC  WBC 4.0 - 10.5 K/uL 12.3  9.2  9.0   Hemoglobin 12.0 - 15.0 g/dL 85.6  86.8  86.4   Hematocrit 36.0 - 46.0 % 43.0  39.1  40.6   Platelets 150 - 400 K/uL 272  192  256      Most recent CMP    Latest Ref Rng & Units 11/18/2023    9:32 AM 11/06/2023   11:30 AM 08/13/2023    9:14 AM  CMP  Glucose 70 - 99 mg/dL 885  91  873   BUN 8 - 23 mg/dL 19  11  26    Creatinine 0.44 - 1.00 mg/dL 8.99  9.16  9.02   Sodium 135 - 145 mmol/L 137  138  137   Potassium 3.5 - 5.1 mmol/L 4.2  4.0  4.8   Chloride 98 - 111 mmol/L 103  100  106   CO2 22 - 32 mmol/L 23  25  21    Calcium  8.9 - 10.3 mg/dL 9.5  9.2  8.9   Total Protein 6.5 - 8.1 g/dL 6.7   6.4   Total Bilirubin 0.0 - 1.2 mg/dL 0.6   0.7   Alkaline Phos 38 - 126 U/L 76   73   AST 15 - 41 U/L 14   17   ALT 0 - 44 U/L 18   16     Renal function Estimated Creatinine Clearance: 56 mL/min (by C-G formula based on SCr of 1 mg/dL).  Hgb A1c MFr Bld (%)  Date Value  09/29/2022 6.1 (H)    LDL Cholesterol (Calc)  Date Value Ref Range Status  11/29/2017 101 (H) mg/dL (calc) Final    Comment:    Reference range: <100 . Desirable range <100 mg/dL for primary prevention;   <70 mg/dL for patients with CHD or diabetic patients  with > or = 2 CHD risk factors. SABRA LDL-C is now calculated using the Martin-Hopkins  calculation, which is a validated novel method providing  better accuracy than the Friedewald equation in the  estimation  of LDL-C.  Gladis APPLETHWAITE et al. SANDREA. 7986;689(80): 2061-2068  (http://education.[employer].com/faq/FAQ164)     CT angiogram of the bilateral lower extremities, personally reviewed.  There is a left common iliac artery occlusion with distal reconstitution.  Debby SAILOR. Magda, MD [facility] of [facility] Phone Number: [phone] 11/18/2023 12:52 PM   Total time spent on preparing this encounter including chart review, data review, collecting history, examining the patient, coordinating care for this new patient, 60 minutes.  Portions of this report may have been transcribed using voice recognition software.  Every effort has been made to ensure accuracy; however, inadvertent computerized transcription errors may still be present.

## 2023-11-18 NOTE — Progress Notes (Signed)
 1330 Report taken from [facility] from ED at this time.

## 2023-11-18 NOTE — ED Provider Triage Note (Signed)
 Emergency Medicine Provider Triage Evaluation Note  Zoe Snyder , a 68 y.o. female  was evaluated in triage.  Pt complains of 68 year old female here today female here today for a few days of a cold left leg.  Patient previously saw PCP who put her on prednisone .  Patient says that she has noticed her foot has become a lot more cold over the last couple days, becomes very painful with ambulation..  Review of Systems    Physical Exam  BP (!) 165/118 (BP Location: Right Arm)   Pulse 96   Temp 99 F (37.2 C)   Resp 18   SpO2 95%  Gen:   Awake, no distress   Resp:  Normal effort  MSK:   Moves extremities without difficulty  Other:  Dopplerable PT pulse on the left leg.  Left foot cold compared to right, color changes of the first and second toe on the right.  Medical Decision Making  Medically screening exam initiated at 9:26 AM.  Appropriate orders placed.  Aerianna J Rumbaugh was informed that the remainder of the evaluation will be completed by another provider, this initial triage assessment does not replace that evaluation, and the importance of remaining in the ED until their evaluation is complete.  Six 68-year-old female here with cold-like.  I was able to get dopplerable pulses in that leg, however do suspect the patient will likely have some degree of peripheral vascular disease.  Have put in for a DVT ultrasound and CT angiography.  Alerted next physician taking care of the patient.   Mannie Pac T, DO 11/18/23 [phone]

## 2023-11-18 NOTE — Telephone Encounter (Signed)
 Pharmacy Patient Advocate Encounter  Insurance verification completed.    The patient is insured through [facility]. Patient has Medicare and is not eligible for a copay card, but may be able to apply for patient assistance or Medicare RX Payment Plan (Patient Must reach out to their plan, if eligible for payment plan), if available.    Ran test claim for ELIQUIS  5MG  and the current 30 day co-pay is $0.00.  Ran test claim for XARELTO  10MG  and the current 30 day co-pay is $0.00.   This test claim was processed through [facility]- copay amounts may vary at other pharmacies due to pharmacy/plan contracts, or as the patient moves through the different stages of their insurance plan.

## 2023-11-18 NOTE — Progress Notes (Signed)
 PHARMACY - ANTICOAGULATION CONSULT NOTE  Pharmacy Consult for heparin  Indication: DVT  Allergies  Allergen Reactions   Epinephrine Anaphylaxis    Respiratory problems, e.g., wheezing; Palpitations  Respiratory problems, e.g., wheezing; Palpitations    Novocain [Procaine] Palpitations and Other (See Comments)    Heart racing   Theraflu Severe Cold Daytime [Dm-Phenylephrine -Acetaminophen ] Palpitations    Patient Measurements: Height: 5' 3 (160 cm) Weight: 83.9 kg (185 lb) IBW/kg (Calculated) : 52.4 Heparin  Dosing Weight: 71 Kg  Vital Signs: Temp: 99 F (37.2 C) (02/06 0912) BP: 165/118 (02/06 0912) Pulse Rate: 96 (02/06 0912)  Labs: Recent Labs    11/18/23 0932  HGB 14.3  HCT 43.0  PLT 272  CREATININE 1.00    Estimated Creatinine Clearance: 56 mL/min (by C-G formula based on SCr of 1 mg/dL).   Medical History: Past Medical History:  Diagnosis Date   COPD (chronic obstructive pulmonary disease) (HCC)    Hashimoto's thyroiditis    History of alcohol abuse 11/30/2017   History of cocaine abuse (HCC) 11/30/2017   History of colon polyps    Hyperlipidemia    Hypertension    Prediabetes    Throat cancer (HCC)    Tobacco use disorder     Assessment: 33 yoF with left leg pain found to have arteriole occlusion no oral anticoagulation reported prior to admission. Pharmacy consulted to start heparin . Vascular consult pending for possible intervention. CBC WNL   Goal of Therapy:  Heparin  level 0.3-0.7 units/ml Monitor platelets by anticoagulation protocol: Yes   Plan:  Give 4000 units bolus x 1 Start heparin  infusion at 1250 units/hr Check anti-Xa level in 8 hours and daily while on heparin  Continue to monitor H&H and platelets  Koren Or, PharmD Clinical Pharmacist 11/18/2023 12:09 PM Please check AMION for all [facility] Pharmacy numbers

## 2023-11-18 NOTE — ED Triage Notes (Signed)
 Pt. Stated, My left leg and foot have been in pain and no circulation and in pain for 2 weeks. I went  [facility] on Jan 25 and was given Prednisone  and helped some and then it started having colness, and throbbing and hurting really bac especially if I stood up

## 2023-11-18 NOTE — Progress Notes (Signed)
 Left lower extremity venous duplex has been completed.  Results can be found in chart review under CV Proc. and relayed to Dr. at bedside.  11/18/2023 12:27 PM  Sreya Froio Rufus Council, RVT.

## 2023-11-18 NOTE — Transfer of Care (Signed)
 Immediate Anesthesia Transfer of Care Note  Patient: Zoe Snyder  Procedure(s) Performed: AORTOGRAM ULTRASOUND GUIDANCE FOR VASCULAR ACCESS (Right: Groin) LEFT COMMON ILIAC ANGIOPLASTY USING VIABAHN 7 MM X AND 7 MM X 59 MM STENTS LEFT GROIN AND FEMORAL ARTERY EXPOSURE (Left: Groin)  Patient Location: PACU  Anesthesia Type:General  Level of Consciousness: awake, alert , and oriented  Airway & Oxygen Therapy: Patient Spontanous Breathing and Patient connected to face mask oxygen  Post-op Assessment: Report given to RN and Post -op Vital signs reviewed and stable  Post vital signs: Reviewed and stable  Last Vitals:  Vitals Value Taken Time  BP 146/69 1835  Temp    Pulse 73 1835  Resp 12 1835  SpO2 95 1835    Last Pain:  Vitals:   11/18/23 1432  TempSrc: Oral  PainSc: 6          Complications: There were no known notable events for this encounter.

## 2023-11-18 NOTE — Op Note (Signed)
 DATE OF SERVICE: 11/18/2023  PATIENT:  Zoe Snyder  68 y.o. 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 My assistant helped with exposure through counter tension, suctioning, ligation and retraction to better visualize the surgical field.  My assistant expedited sewing during the case by following my sutures. Wherever I use the term we in the report, my assistant actively helped me with that portion of the procedure.  ANESTHESIA:   general  EBL:  BLOOD ADMINISTERED:none  DRAINS: none   LOCAL MEDICATIONS USED:  NONE  SPECIMEN:  none  COUNTS: confirmed correct.  TOURNIQUET:  none  PATIENT DISPOSITION:  PACU - hemodynamically stable.   Delay start of Pharmacological VTE agent (>24hrs) due to surgical blood loss or risk of bleeding: no  INDICATION FOR PROCEDURE: Zoe Snyder is a 68 y.o. female with left leg severe pain and ischemic appearance to toes. The patient reported about 1 week of symptoms. I was concerned she had acute limb ischemia. CT angiogram showed left common iliac occlusion. After careful discussion of risks, benefits, and alternatives the patient was offered left iliofemoral thrombectomy. The patient understood and wished to proceed.  OPERATIVE FINDINGS:  Unable to pass Fogarty catheter  retrograde into aorta. Retrograde angiogram confirmed occlusion I tried to cross the lesion retrograde, but entered a dissection plane that I could not exit. I accessed the right common femoral artery and was able to cross the lesion antegrade with a steerable sheath Completion angiogram shows recanalization of the common iliac occlusion with intact flow to the foot Doppler flow confirmed in right foot after closure device  DESCRIPTION OF PROCEDURE: After identification of the patient in the pre-operative holding area, the patient was transferred to the operating room. The patient was positioned supine on the operating room table. Anesthesia was induced. The left leg was prepped and draped in standard fashion. A surgical pause was performed confirming correct patient, procedure, and operative location.  Intraoperative ultrasound was used to evaluate the left femoral bifurcation. An oblique incision was planned and made in the left groin. The incision was carried down through the subcutaneous tissue carefully with Bovie electrocautery. The femoral sheath was identified and carefully entered. The femoral arteries were skeletonized and encircled with silastic vessel loops.  A small transverse arteriotomy was made just proximal to the bifurcation of common femoral artery. The patient was systemically heparinized. Activated clotting time measurements were used throughout the case to confirm adequate anticoagulation. I attempted to pass a Fogarty retrograde across the iliac occlusion. This would not pass, and I became suspicious that this was a chronic total occlusion.  I carefully introduced a 72F sheath into the arteriotomy over a J wire. A retrograde angiogram was performed re-demonstrated the left common iliac occlusion. I tried to cross the lesion retrograde with a glidewire advantage, but entered a dissection plane. I could not exit the  plane, and it carried on to the aorta. I stopped here and changed my  approach. The arteriotomy was repaired with interrupted 6-O prolene suture.  The right groin was prepped and draped in sterile fashion. Micropuncture technique was used to access the right common femoral artery. An 035 Benson wire was navigated under fluroscopic guidance into the terminal aorta. Access was upsized to 13F. An omniflush catheter was advanced over the wire into the terminal aorta. A limited, pelvic angiogram was performed. This showed the dissection did not limit flow into the right iliac system.   I exchanged the sheath over a glidewire advantage for a 64F steerable sheath. I was able to cross the lesion antegrade with the support of the sheath. I measured the native right common iliac artery at about 6.19mm. A 7x19mm VBX stent was selected to cross the lesion. This was deployed successfully. A small nidus of atheroma or thrombus was noted proximally and so an additional 7x30mm VBX was deployed to cover this defect. Follow up angiogram showed resolution of the iliac occlusion and improved flow to the left foot.  A perclose device was used to close the right common femoral arteriotomy with good result. Doppler flow was confirmed in the right foot after deployment.  The left groin was then inspected and hemostasis confirmed. The wound was closed in layers using 2-O vicryl; 3-O vicryl; 4-O monocryl. Heparin  was reversed with protamine .   Upon completion of the case instrument and sharps counts were confirmed correct. The patient was transferred to the PACU in good condition. I was present for all portions of the procedure.  FOLLOW UP PLAN: Assuming a normal postoperative course, I will see the patient in 4 weeks with ABI and aortoiliac duplex.   Debby SAILOR. Magda, MD [facility] of [facility] Phone Number: [phone] 11/18/2023 5:59 PM

## 2023-11-18 NOTE — ED Notes (Signed)
 Second page to Vascular Surgery , PA also called

## 2023-11-19 ENCOUNTER — Other Ambulatory Visit (HOSPITAL_COMMUNITY): Payer: Self-pay

## 2023-11-19 ENCOUNTER — Encounter (HOSPITAL_COMMUNITY): Payer: Self-pay | Admitting: Vascular Surgery

## 2023-11-19 LAB — BASIC METABOLIC PANEL
Anion gap: 10 (ref 5–15)
BUN: 15 mg/dL (ref 8–23)
CO2: 23 mmol/L (ref 22–32)
Calcium: 8.6 mg/dL — ABNORMAL LOW (ref 8.9–10.3)
Chloride: 103 mmol/L (ref 98–111)
Creatinine, Ser: 0.97 mg/dL (ref 0.44–1.00)
GFR, Estimated: >60 mL/min (ref >60)
Glucose, Bld: 220 mg/dL — ABNORMAL HIGH (ref 70–99)
Potassium: 4.3 mmol/L (ref 3.5–5.1)
Sodium: 136 mmol/L (ref 135–145)

## 2023-11-19 LAB — CBC
HCT: 37.2 % (ref 36.0–46.0)
Hemoglobin: 12.4 g/dL (ref 12.0–15.0)
MCH: 30.8 pg (ref 26.0–34.0)
MCHC: 33.3 g/dL (ref 30.0–36.0)
MCV: 92.5 fL (ref 80.0–100.0)
Platelets: 243 10*3/uL (ref 150–400)
RBC: 4.02 MIL/uL (ref 3.87–5.11)
RDW: 14.5 % (ref 11.5–15.5)
WBC: 18.6 10*3/uL — ABNORMAL HIGH (ref 4.0–10.5)
nRBC: 0 % (ref 0.0–0.2)

## 2023-11-19 MED ORDER — OXYCODONE HCL 5 MG PO TABS
5.0000 mg | ORAL_TABLET | Freq: Four times a day (QID) | ORAL | 0 refills | Status: DC | PRN
  Filled 2023-11-19: qty 20, 5d supply, fill #0

## 2023-11-19 MED ORDER — CLOPIDOGREL BISULFATE 75 MG PO TABS
75.0000 mg | ORAL_TABLET | Freq: Every day | ORAL | 3 refills | Status: DC
  Filled 2023-11-19: qty 30, 30d supply, fill #0

## 2023-11-19 MED ORDER — APIXABAN 5 MG PO TABS
5.0000 mg | ORAL_TABLET | Freq: Two times a day (BID) | ORAL | 1 refills | Status: DC
  Filled 2023-11-19: qty 60, 30d supply, fill #0

## 2023-11-19 MED ORDER — APIXABAN 5 MG PO TABS
10.0000 mg | ORAL_TABLET | Freq: Two times a day (BID) | ORAL | Status: DC
  Administered 2023-11-19: 10 mg via ORAL
  Filled 2023-11-19: qty 2

## 2023-11-19 MED ORDER — APIXABAN 5 MG PO TABS: 5.0000 mg | ORAL_TABLET | Freq: Two times a day (BID) | ORAL | Status: DC

## 2023-11-19 MED ORDER — ASPIRIN 81 MG PO TBEC
81.0000 mg | DELAYED_RELEASE_TABLET | Freq: Every day | ORAL | 12 refills | Status: DC
  Filled 2023-11-19: qty 30, 30d supply, fill #0

## 2023-11-19 MED ORDER — APIXABAN (ELIQUIS) VTE STARTER PACK (10MG AND 5MG)
ORAL_TABLET | ORAL | 0 refills | Status: DC
  Filled 2023-11-19: qty 74, 30d supply, fill #0

## 2023-11-19 NOTE — Progress Notes (Addendum)
  Progress Note    11/19/2023 7:51 AM 1 Day Post-Op  Subjective:  no complaints   Vitals:   11/19/23 0210 11/19/23 0335  BP: (!) 158/91 (!) 114/58  Pulse: 80 80  Resp: 17 18  Temp:  98.6 F (37 C)  SpO2:  98%   Physical Exam: Lungs:  non labored Incisions:  L groin c/d/I; R groin without hematoma Extremities:  R AT and PT by doppler; L DP palpable Neurologic: A&O  CBC    Component Value Date/Time   WBC 12.3 (H) 11/18/2023 0932   RBC 4.62 11/18/2023 0932   HGB 11.9 (L) 11/18/2023 1705   HCT 35.0 (L) 11/18/2023 1705   PLT 272 11/18/2023 0932   MCV 93.1 11/18/2023 0932   MCH 31.0 11/18/2023 0932   MCHC 33.3 11/18/2023 0932   RDW 14.4 11/18/2023 0932   LYMPHSABS 2.3 11/18/2023 0932   MONOABS 1.2 (H) 11/18/2023 0932   EOSABS 0.3 11/18/2023 0932   BASOSABS 0.1 11/18/2023 0932    BMET    Component Value Date/Time   NA 138 11/18/2023 1705   K 4.2 11/18/2023 1705   CL 104 11/18/2023 1705   CO2 23 11/18/2023 0932   GLUCOSE 142 (H) 11/18/2023 1705   BUN 16 11/18/2023 1705   CREATININE 1.00 11/18/2023 1705   CREATININE 0.95 11/29/2017 1609   CALCIUM  9.5 11/18/2023 0932   GFRNONAA >60 11/18/2023 0932   GFRNONAA 65 11/29/2017 1609   GFRAA 75 11/29/2017 1609    INR No results found for: INR   Intake/Output Summary (Last 24 hours) at 11/19/2023 0751 Last data filed at 11/19/2023 9367 Gross per 24 hour  Intake 2297.86 ml  Output 1350 ml  Net 947.86 ml     Assessment/Plan:  68 y.o. female is s/p L CIA stenting 1 Day Post-Op   BLE well perfused on exam; palpable L DP pulse L groin incision c/d/I; R groin access site without hematoma Ambulate this morning Pharmacy consulted for DOAC affordability Home this afternoon if ambulating well and transitioned to DOAC   Donnice Sender, PA-C [facility] [phone] 11/19/2023 7:51 AM  VASCULAR STAFF ADDENDUM: I have independently interviewed and examined the patient. I agree with the above.    Debby SAILOR. Magda, MD [facility] Phone Number: [phone] 11/19/2023 2:41 PM

## 2023-11-19 NOTE — Anesthesia Postprocedure Evaluation (Signed)
 Anesthesia Post Note  Patient: Zoe Snyder  Procedure(s) Performed: AORTOGRAM ULTRASOUND GUIDANCE FOR VASCULAR ACCESS (Right: Groin) LEFT COMMON ILIAC ANGIOPLASTY USING VIABAHN 7 MM X AND 7 MM X 59 MM STENTS LEFT GROIN AND FEMORAL ARTERY EXPOSURE (Left: Groin)     Patient location during evaluation: PACU Anesthesia Type: General Level of consciousness: awake and alert Pain management: pain level controlled Vital Signs Assessment: post-procedure vital signs reviewed and stable Respiratory status: spontaneous breathing, nonlabored ventilation, respiratory function stable and patient connected to nasal cannula oxygen Cardiovascular status: blood pressure returned to baseline and stable Postop Assessment: no apparent nausea or vomiting Anesthetic complications: no   There were no known notable events for this encounter.  Last Vitals:  Vitals:   11/19/23 0849 11/19/23 1455  BP: (!) 159/71 (!) 166/62  Pulse:  77  Resp:  20  Temp:  36.9 C  SpO2:  95%    Last Pain:  Vitals:   11/19/23 1455  TempSrc: Oral  PainSc:                  Thom JONELLE Peoples

## 2023-11-19 NOTE — Discharge Instructions (Addendum)
 Information on my medicine - ELIQUIS  (apixaban )   Why was Eliquis  prescribed for you? Eliquis  was prescribed to treat blood clots that may have been found in the veins of your legs (deep vein thrombosis) or in your lungs (pulmonary embolism) and to reduce the risk of them occurring again.  What do You need to know about Eliquis  ? The starting dose is 10 mg (two 5 mg tablets) taken TWICE daily for the FIRST SEVEN (7) DAYS, then on (enter date)  11/26/23  the dose is reduced to ONE 5 mg tablet taken TWICE daily.  Eliquis  may be taken with or without food.   Try to take the dose about the same time in the morning and in the evening. If you have difficulty swallowing the tablet whole please discuss with your pharmacist how to take the medication safely.  Take Eliquis  exactly as prescribed and DO NOT stop taking Eliquis  without talking to the doctor who prescribed the medication.  Stopping may increase your risk of developing a new blood clot.  Refill your prescription before you run out.  After discharge, you should have regular check-up appointments with your healthcare provider that is prescribing your Eliquis .    What do you do if you miss a dose? If a dose of ELIQUIS  is not taken at the scheduled time, take it as soon as possible on the same day and twice-daily administration should be resumed. The dose should not be doubled to make up for a missed dose.  Important Safety Information A possible side effect of Eliquis  is bleeding. You should call your healthcare provider right away if you experience any of the following: Bleeding from an injury or your nose that does not stop. Unusual colored urine (red or dark brown) or unusual colored stools (red or black). Unusual bruising for unknown reasons. A serious fall or if you hit your head (even if there is no bleeding).  Some medicines may interact with Eliquis  and might increase your risk of bleeding or clotting while on Eliquis . To  help avoid this, consult your healthcare provider or pharmacist prior to using any new prescription or non-prescription medications, including herbals, vitamins, non-steroidal anti-inflammatory drugs (NSAIDs) and supplements.  This website has more information on Eliquis  (apixaban ): [email or web address] .com/eliquis dena

## 2023-11-19 NOTE — Progress Notes (Signed)
 AVS completed; copy placed at nurses station.

## 2023-11-19 NOTE — Progress Notes (Signed)
 PHARMACY - ANTICOAGULATION CONSULT NOTE  Pharmacy Consult for heparin  >> Eliquis  Indication: DVT  Allergies  Allergen Reactions   Epinephrine Anaphylaxis    Respiratory problems, e.g., wheezing; Palpitations  Respiratory problems, e.g., wheezing; Palpitations    Novocain [Procaine] Palpitations and Other (See Comments)    Heart racing   Theraflu Severe Cold Daytime [Dm-Phenylephrine -Acetaminophen ] Palpitations    Patient Measurements: Height: 5' 3 (160 cm) Weight: 83.9 kg (185 lb) IBW/kg (Calculated) : 52.4 Heparin  Dosing Weight: 71 Kg  Vital Signs: Temp: 98.8 F (37.1 C) (02/07 0756) Temp Source: Oral (02/07 0756) BP: 159/71 (02/07 0756) Pulse Rate: 70 (02/07 0756)  Labs: Recent Labs    11/18/23 0932 11/18/23 1638 11/18/23 1705  HGB 14.3 12.2 11.9*  HCT 43.0 36.0 35.0*  PLT 272  --   --   CREATININE 1.00  --  1.00    Estimated Creatinine Clearance: 56 mL/min (by C-G formula based on SCr of 1 mg/dL).   Medical History: Past Medical History:  Diagnosis Date   COPD (chronic obstructive pulmonary disease) (HCC)    Hashimoto's thyroiditis    History of alcohol abuse 11/30/2017   History of cocaine abuse (HCC) 11/30/2017   History of colon polyps    Hyperlipidemia    Hypertension    Prediabetes    Throat cancer (HCC)    Tobacco use disorder     Assessment: 61 yoF with left leg pain found to have arteriole occlusion no oral anticoagulation reported prior to admission. S/p thrombectomy 2/6 - pharmacy consulted to transition heparin  to Eliquis .   Goal of Therapy:  Monitor platelets by anticoagulation protocol: Yes   Plan:  Stop IV heparin   Start Eliquis  10mg  PO BID followed by 5mg  PO BID Copay $0  Prudence Dollar, PharmD, BCPS 11/19/2023 7:59 AM

## 2023-11-19 NOTE — Progress Notes (Signed)
 Patient discharged from unit  medications and property returned to designated person. Discharge instructions reviewed and patient questions answered.

## 2023-11-22 NOTE — Discharge Summary (Signed)
 Discharge Summary  Patient ID: Zoe Snyder [id number] 68 y.o. 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 Zoe Snyder is a 68 year old female who presented to the emergency department with acute limb ischemia of the left lower extremity.  She was noted to have an occluded left common iliac artery on CTA.  She was brought to the operating room by Dr. Robertson on 11/18/2023 and underwent exposure of the left common femoral artery.  A wire was unable to be passed retrograde through the common iliac artery occlusion.  The right groin was then prepped and accessed under ultrasound guidance.  The lesion was crossed antegrade and eventually stented with VBX.  She tolerated the procedure well and was admitted to the hospital postoperatively.  During workup preoperatively she was also noted to have a left femoral and popliteal vein DVT.  She was maintained on IV heparin .  Postoperative day #1 she had a palpable left DP pulse and was without any rest pain.  She was transitioned from IV heparin  to Eliquis  with the help of pharmacy staff.  She will need triple therapy with Eliquis , Plavix , and aspirin .  She will be evaluated in the office in about 2 weeks.  Discharge instructions were reviewed with the patient and she voiced her understanding.  She was discharged home in stable condition after ambulating without difficulty.  Consults: None  Treatments: surgery: Exposure of left common femoral artery with left common iliac artery angioplasty and stenting by Dr. Robertson on 11/18/2023  Discharge Exam: See progress  note 11/19/23 Vitals:   11/19/23 0849 11/19/23 1455  BP: (!) 159/71 (!) 166/62  Pulse:  77  Resp:  20  Temp:  98.5 F (36.9 C)  SpO2:  95%     Disposition: Discharge disposition: 01-Home or Self Care       Patient Instructions:  Allergies as of 11/19/2023       Reactions   Epinephrine Anaphylaxis   Respiratory problems, e.g., wheezing; Palpitations  Palpitations Respiratory problems, e.g., wheezing;, Palpitations   Novocain [procaine] Palpitations, Other (See Comments)   Heart racing   Theraflu Severe Cold Daytime [dm-phenylephrine -acetaminophen ] Palpitations        Medication List     TAKE these medications    albuterol  (2.5 MG/3ML) 0.083% nebulizer solution Commonly known as: PROVENTIL  Inhale 3 mLs (2.5 mg total) by nebulization every 6 (six) hours as needed for wheezing or shortness of breath.   albuterol  108 (90 Base) MCG/ACT inhaler Commonly known as: VENTOLIN  HFA Inhale 2 puffs into the lungs every 4 (four) hours as needed for wheezing or shortness of breath.   amLODipine  10 MG tablet Commonly known as: NORVASC  Take 1 tablet (10 mg total) by mouth daily. Due for follow up visit   aspirin  EC 81 MG tablet Take 1 tablet (81 mg total) by mouth daily at 6 (six) AM. Swallow whole.   atorvastatin  20 MG tablet Commonly known as: LIPITOR Take 20 mg by mouth at bedtime.   Breo Ellipta  100-25 MCG/ACT Aepb Generic drug: fluticasone  furoate-vilanterol Inhale 1 puff into the lungs daily.   clopidogrel  75 MG tablet Commonly known as: PLAVIX  Take 1 tablet (  75 mg total) by mouth daily.   Eliquis  DVT/PE Starter Pack Generic drug: Apixaban  Starter Pack (10mg  and 5mg ) Take as directed on package: start with two-5mg  tablets twice daily for 7 days. On day 8, switch to one-5mg  tablet twice daily.   Eliquis  5 MG Tabs tablet Generic drug: apixaban  Take 1 tablet (5 mg total) by mouth 2 (two) times daily. Start taking on: December 16, 2023   ipratropium-albuterol   0.5-2.5 (3) MG/3ML Soln Commonly known as: DUONEB Take 3 mLs by nebulization every 4 (four) hours as needed.   levothyroxine  75 MCG tablet Commonly known as: SYNTHROID  Take 75 mcg by mouth every morning.   methylPREDNISolone  4 MG tablet Commonly known as: MEDROL  Take 4 mg by mouth See admin instructions. Take 6 tablets on day 1 Take 5 tablets on day 2 Take 4 tablets on day 3  Take 3 tablets on day 4 Take 2 tablets on day 5 Take 1 tablet on day 6   Multi-Vitamin tablet Take 1 tablet by mouth daily.   oxyCODONE  5 MG immediate release tablet Commonly known as: Oxy IR/ROXICODONE  Take 1 tablet (5 mg total) by mouth every 6 (six) hours as needed for moderate pain (pain score 4-6).   predniSONE  20 MG tablet Commonly known as: DELTASONE  Take 40 mg by mouth daily with breakfast. For 5 days.   promethazine -dextromethorphan 6.25-15 MG/5ML syrup Commonly known as: PROMETHAZINE -DM Take 5 mLs by mouth at bedtime as needed for cough.   umeclidinium bromide  62.5 MCG/ACT Aepb Commonly known as: INCRUSE ELLIPTA  Inhale 1 puff into the lungs daily.   valsartan 80 MG tablet Commonly known as: DIOVAN Take 80 mg by mouth daily.   Vitamin D -1000 Max St 25 MCG (1000 UT) tablet Generic drug: Cholecalciferol  Take 1 tablet (1,000 Units total) by mouth daily.       Activity: activity as tolerated Diet: regular diet Wound Care: keep wound clean and dry  Follow-up with [facility] in 2 weeks.  SignedBETHA Donnice Sender, PA-C 11/22/2023 10:28 AM [facility] Office: [phone]

## 2023-12-03 ENCOUNTER — Ambulatory Visit (INDEPENDENT_AMBULATORY_CARE_PROVIDER_SITE_OTHER): Payer: 59 | Admitting: Physician Assistant

## 2023-12-03 VITALS — BP 151/88 | HR 82 | Temp 97.3°F | Ht 61.0 in | Wt 191.7 lb

## 2023-12-03 DIAGNOSIS — I82402 Acute embolism and thrombosis of unspecified deep veins of left lower extremity: Secondary | ICD-10-CM

## 2023-12-03 DIAGNOSIS — I739 Peripheral vascular disease, unspecified: Secondary | ICD-10-CM

## 2023-12-03 DIAGNOSIS — I70229 Atherosclerosis of native arteries of extremities with rest pain, unspecified extremity: Secondary | ICD-10-CM

## 2023-12-03 DIAGNOSIS — I745 Embolism and thrombosis of iliac artery: Secondary | ICD-10-CM

## 2023-12-03 MED ORDER — CLOPIDOGREL BISULFATE 75 MG PO TABS: 75.0000 mg | ORAL_TABLET | Freq: Every day | ORAL | 3 refills | Status: DC

## 2023-12-03 NOTE — Progress Notes (Signed)
 POST OPERATIVE OFFICE NOTE    CC:  F/u for surgery  HPI:  This is a 68 y.o. female who is s/p 1) exposure of left common femoral artery and direct repair of arteriotomy 2) ultrasound guided right common femoral artery access 3) limited pelvic aortogram 4) left common iliac artery angioplasty and stenting (7x25mm VBX + 7x79mm VBX) 5) left lower extremity first order angiogram on 11/18/23 by Dr. Lenell Antu.  This was for acute limb ischemia of left lower extremity. She also was found to have left femoral and popliteal DVT so she was treated with IV heparin and transitioned to Eliquis on d/c. She was discharged home on POD#1. She is currently on Eliquis, Plavix and Aspirin.   Pt returns today for follow up.  Pt states overall her leg is doing better. She complains of intermittent coldness in both of her feet/ toes. She is still having left groin incision site pain and burning. She feels it is getting better. She had difficulty with pain medication she was taking post op and constipation but this is now resolved. She has been unsure of how to take care of her incision. She says she left initial dressings on for 1 week an then showered and removed the steri strips. She has been trying to wash it and pat it dry. She says she has noticed a little drainage at times. She attempted to get some gauze from [facility] but she reports that they did not have what she wanted. She asked me to look over her medications and make sure she is taking them correctly. She is compliant with her Aspirin, Plavix, Eliquis and statin. She does report that she is working on quitting smoking because she says "I know I should not be".   Allergies  Allergen Reactions   Epinephrine Anaphylaxis    Respiratory problems, e.g., wheezing;  Palpitations   Palpitations  Respiratory problems, e.g., wheezing;, Palpitations   Novocain [Procaine] Palpitations and Other (See Comments)    Heart racing   Theraflu Severe Cold Daytime  [Dm-Phenylephrine-Acetaminophen] Palpitations    Current Outpatient Medications  Medication Sig Dispense Refill   albuterol (PROVENTIL) (2.5 MG/3ML) 0.083% nebulizer solution Inhale 3 mLs (2.5 mg total) by nebulization every 6 (six) hours as needed for wheezing or shortness of breath. 270 mL 0   albuterol (VENTOLIN HFA) 108 (90 Base) MCG/ACT inhaler Inhale 2 puffs into the lungs every 4 (four) hours as needed for wheezing or shortness of breath. 8 g 0   amLODipine (NORVASC) 10 MG tablet Take 1 tablet (10 mg total) by mouth daily. Due for follow up visit 30 tablet 1   [START ON 12/16/2023] apixaban (ELIQUIS) 5 MG TABS tablet Take 1 tablet (5 mg total) by mouth 2 (two) times daily. 60 tablet 1   APIXABAN (ELIQUIS) VTE STARTER PACK (10MG  AND 5MG ) Take as directed on package: start with two-5mg  tablets twice daily for 7 days. On day 8, switch to one-5mg  tablet twice daily. 74 each 0   aspirin EC 81 MG tablet Take 1 tablet (81 mg total) by mouth daily at 6 (six) AM. Swallow whole. 30 tablet 12   atorvastatin (LIPITOR) 20 MG tablet Take 20 mg by mouth at bedtime.     Cholecalciferol (VITAMIN D-1000 MAX ST) 25 MCG (1000 UT) tablet Take 1 tablet (1,000 Units total) by mouth daily. 12 tablet 0   clopidogrel (PLAVIX) 75 MG tablet Take 1 tablet (75 mg total) by mouth daily. 30 tablet 3   fluticasone furoate-vilanterol (BREO  ELLIPTA) 100-25 MCG/ACT AEPB Inhale 1 puff into the lungs daily. 60 each 0   ipratropium-albuterol (DUONEB) 0.5-2.5 (3) MG/3ML SOLN Take 3 mLs by nebulization every 4 (four) hours as needed. 360 mL 1   levothyroxine (SYNTHROID) 75 MCG tablet Take 75 mcg by mouth every morning.     methylPREDNISolone (MEDROL) 4 MG tablet Take 4 mg by mouth See admin instructions. Take 6 tablets on day 1 Take 5 tablets on day 2 Take 4 tablets on day 3  Take 3 tablets on day 4 Take 2 tablets on day 5 Take 1 tablet on day 6 (Patient not taking: Reported on 11/19/2023)     Multiple Vitamin (MULTI-VITAMIN)  tablet Take 1 tablet by mouth daily.     oxyCODONE (OXY IR/ROXICODONE) 5 MG immediate release tablet Take 1 tablet (5 mg total) by mouth every 6 (six) hours as needed for moderate pain (pain score 4-6). 20 tablet 0   predniSONE (DELTASONE) 20 MG tablet Take 40 mg by mouth daily with breakfast. For 5 days. (Patient not taking: Reported on 11/19/2023)     promethazine-dextromethorphan (PROMETHAZINE-DM) 6.25-15 MG/5ML syrup Take 5 mLs by mouth at bedtime as needed for cough.     umeclidinium bromide (INCRUSE ELLIPTA) 62.5 MCG/ACT AEPB Inhale 1 puff into the lungs daily. 30 each 0   valsartan (DIOVAN) 80 MG tablet Take 80 mg by mouth daily.     No current facility-administered medications for this visit.     ROS:  See HPI  Physical Exam:  Vitals:   12/03/23 1003  BP: (!) 151/88  Pulse: 82  Temp: (!) 97.3 F (36.3 C)  SpO2: 97%   Incision:  left groin incision is intact, minimal area of firmness likely just healing ridge vs small hematoma. No signs of infection, no drainage. Dry gauze dressing applied Extremities:  BLE well perfused and warm . 2+ femoral pulses, Doppler monophasic right Dp and PT, biphasic left DP and PT Neuro: alert and oriented Abdomen:  obese, soft  Assessment/Plan:  This is a 68 y.o. female who is s/p: 1) exposure of left common femoral artery and direct repair of arteriotomy 2) ultrasound guided right common femoral artery access 3) limited pelvic aortogram 4) left common iliac artery angioplasty and stenting (7x58mm VBX + 7x40mm VBX) 5) left lower extremity first order angiogram on 11/18/23 by Dr. Lenell Antu.  - BLE well perfused and warm with doppler signals - left groin incision is intact and well appearing - Instructed her to wash her left groin incision daily with mild soap and water, pat dry. She should not soak in bathtub. She can apply dry gauze to area daily to wick moisture - continue Eliquis, Aspirin, Plavix and statin - she knows to call for earlier follow up if  any concerns about her incision or new symptoms  -She has an appointment on 12/24/23 with ABI and iliac duplex that she will keep   Nathanial Rancher, [facility] [phone]   Clinic MD:  Hetty Blend

## 2023-12-20 ENCOUNTER — Other Ambulatory Visit: Payer: Self-pay

## 2023-12-20 DIAGNOSIS — I739 Peripheral vascular disease, unspecified: Secondary | ICD-10-CM

## 2023-12-20 DIAGNOSIS — I745 Embolism and thrombosis of iliac artery: Secondary | ICD-10-CM

## 2023-12-24 ENCOUNTER — Ambulatory Visit (HOSPITAL_COMMUNITY)
Admission: RE | Admit: 2023-12-24 | Discharge: 2023-12-24 | Disposition: A | Discharge status: Home / Self Care | Payer: 59 | Source: Ambulatory Visit | Attending: Vascular Surgery | Admitting: Vascular Surgery

## 2023-12-24 ENCOUNTER — Ambulatory Visit (INDEPENDENT_AMBULATORY_CARE_PROVIDER_SITE_OTHER)
Admit: 2023-12-24 | Discharge: 2023-12-24 | Disposition: A | Discharge status: Home / Self Care | Payer: 59 | Attending: Vascular Surgery | Admitting: Vascular Surgery

## 2023-12-24 DIAGNOSIS — I739 Peripheral vascular disease, unspecified: Secondary | ICD-10-CM | POA: Diagnosis present

## 2023-12-24 DIAGNOSIS — I745 Embolism and thrombosis of iliac artery: Secondary | ICD-10-CM | POA: Diagnosis present

## 2023-12-24 LAB — VAS US ABI WITH/WO TBI
Left ABI: 0.87
Right ABI: 0.5

## 2023-12-28 ENCOUNTER — Encounter: Payer: Self-pay | Admitting: Vascular Surgery

## 2023-12-28 ENCOUNTER — Ambulatory Visit (INDEPENDENT_AMBULATORY_CARE_PROVIDER_SITE_OTHER): Payer: 59 | Admitting: Physician Assistant

## 2023-12-28 VITALS — BP 138/86 | HR 92 | Temp 97.6°F | Resp 22 | Ht 61.0 in | Wt 187.0 lb

## 2023-12-28 DIAGNOSIS — I739 Peripheral vascular disease, unspecified: Secondary | ICD-10-CM

## 2023-12-28 DIAGNOSIS — I82402 Acute embolism and thrombosis of unspecified deep veins of left lower extremity: Secondary | ICD-10-CM

## 2023-12-28 DIAGNOSIS — I745 Embolism and thrombosis of iliac artery: Secondary | ICD-10-CM

## 2023-12-28 NOTE — Progress Notes (Unsigned)
 POST OPERATIVE OFFICE NOTE    CC:  F/u for surgery  HPI:  Zoe Snyder s a 68 y.o. female who is s/p left common iliac artery angioplasty and stenting with left common femoral artery cutdown and direct repair on 11/18/2023 by Dr.Hawken. This was done for LLE acute limb ischemia. Upon hospitalization she was also found to have acute DVT of the left femoral and popliteal veins. She was started on heparin and transitioned to Eliquis at d/c.   Post operatively she was having some intermittent coldness in her feet and toes. Her incision was healing okay with mild drainage.  At follow up today she says she is doing okay. She is still having some intermittent coldness and throbbing in her feet and toes. She denies any constant pain or rest pain. She also denies any claudication. She does endorse generalized fatigue and does not feel like she can go back to work yet. She usually works 3 days a week for 12 hr shifts.  She denies any issues with lower extremity swelling.  She is wondering how long she has to take aspirin, Plavix, and Eliquis.  She says that she has been bruising fairly easily since her surgery.   Allergies  Allergen Reactions   Epinephrine Anaphylaxis    Respiratory problems, e.g., wheezing;  Palpitations   Palpitations  Respiratory problems, e.g., wheezing;, Palpitations   Novocain [Procaine] Palpitations and Other (See Comments)    Heart racing   Theraflu Severe Cold Daytime [Dm-Phenylephrine-Acetaminophen] Palpitations    Current Outpatient Medications  Medication Sig Dispense Refill   amLODipine (NORVASC) 10 MG tablet Take 1 tablet (10 mg total) by mouth daily. Due for follow up visit 30 tablet 1   apixaban (ELIQUIS) 5 MG TABS tablet Take 1 tablet (5 mg total) by mouth 2 (two) times daily. 60 tablet 1   aspirin EC 81 MG tablet Take 1 tablet (81 mg total) by mouth daily at 6 (six) AM. Swallow whole. 30 tablet 12   atorvastatin (LIPITOR) 20 MG tablet Take 20 mg by  mouth at bedtime.     Cholecalciferol (VITAMIN D-1000 MAX ST) 25 MCG (1000 UT) tablet Take 1 tablet (1,000 Units total) by mouth daily. 12 tablet 0   clopidogrel (PLAVIX) 75 MG tablet Take 1 tablet (75 mg total) by mouth daily. 30 tablet 3   ipratropium-albuterol (DUONEB) 0.5-2.5 (3) MG/3ML SOLN Take 3 mLs by nebulization every 4 (four) hours as needed. 360 mL 1   levothyroxine (SYNTHROID) 75 MCG tablet Take 75 mcg by mouth every morning.     Multiple Vitamin (MULTI-VITAMIN) tablet Take 1 tablet by mouth daily.     valsartan (DIOVAN) 80 MG tablet Take 80 mg by mouth daily.     albuterol (PROVENTIL) (2.5 MG/3ML) 0.083% nebulizer solution Inhale 3 mLs (2.5 mg total) by nebulization every 6 (six) hours as needed for wheezing or shortness of breath. 270 mL 0   albuterol (VENTOLIN HFA) 108 (90 Base) MCG/ACT inhaler Inhale 2 puffs into the lungs every 4 (four) hours as needed for wheezing or shortness of breath. 8 g 0   APIXABAN (ELIQUIS) VTE STARTER PACK (10MG  AND 5MG ) Take as directed on package: start with two-5mg  tablets twice daily for 7 days. On day 8, switch to one-5mg  tablet twice daily. 74 each 0   fluticasone furoate-vilanterol (BREO ELLIPTA) 100-25 MCG/ACT AEPB Inhale 1 puff into the lungs daily. 60 each 0   methylPREDNISolone (MEDROL) 4 MG tablet Take 4 mg by mouth See admin instructions. Take  6 tablets on day 1 Take 5 tablets on day 2 Take 4 tablets on day 3  Take 3 tablets on day 4 Take 2 tablets on day 5 Take 1 tablet on day 6 (Patient not taking: Reported on 11/19/2023)     oxyCODONE (OXY IR/ROXICODONE) 5 MG immediate release tablet Take 1 tablet (5 mg total) by mouth every 6 (six) hours as needed for moderate pain (pain score 4-6). (Patient not taking: Reported on 12/28/2023) 20 tablet 0   predniSONE (DELTASONE) 20 MG tablet Take 40 mg by mouth daily with breakfast. For 5 days. (Patient not taking: Reported on 11/19/2023)     promethazine-dextromethorphan (PROMETHAZINE-DM) 6.25-15 MG/5ML  syrup Take 5 mLs by mouth at bedtime as needed for cough.     umeclidinium bromide (INCRUSE ELLIPTA) 62.5 MCG/ACT AEPB Inhale 1 puff into the lungs daily. 30 each 0   No current facility-administered medications for this visit.     ROS:  See HPI  Physical Exam:  Incision: Left groin incision well-healed without signs of infection Extremities: Brisk DP/PT Doppler signals in bilateral lower extremities.  No tissue loss of left foot Neuro: Intact motor and sensation of bilateral lower extremities  Studies: ABIs (12/24/2023) Right    Rt Pressure (mmHg)IndexWaveform  Comment   +---------+------------------+-----+----------+--------+  Brachial 159                                        +---------+------------------+-----+----------+--------+  PTA     80                0.50 monophasic          +---------+------------------+-----+----------+--------+  DP      73                0.46 monophasic          +---------+------------------+-----+----------+--------+  Great Toe35                0.22                     +---------+------------------+-----+----------+--------+   +---------+------------------+-----+-----------+-------+  Left    Lt Pressure (mmHg)IndexWaveform   Comment  +---------+------------------+-----+-----------+-------+  Brachial 154                                        +---------+------------------+-----+-----------+-------+  PTA     138               0.87 biphasic            +---------+------------------+-----+-----------+-------+  DP      120               0.75 multiphasic         +---------+------------------+-----+-----------+-------+  Great Toe55                0.35                     +---------+------------------+-----+-----------+-------+   +-------+-----------+-----------+------------+------------+  ABI/TBIToday's ABIToday's TBIPrevious ABIPrevious TBI   +-------+-----------+-----------+------------+------------+  Right 0.50       0.22                                 +-------+-----------+-----------+------------+------------+  Left  0.87  0.35                                  Aorta/IVC/Iliac Duplex (12/24/2023) Patent left common iliac artery stent without stenosis   Assessment/Plan:  This is a 68 y.o. female who is here for repeat postop check  -The patient recently underwent angiogram with left common femoral artery cutdown, femoral artery repair, and left common iliac artery stenting for acute limb ischemia -Baseline ABIs have been obtained.  Her left ABI 0.87 and right ABI 0.5 -Duplex demonstrates a patent left common iliac artery stent without stenosis -She no longer has any rest pain in her left foot.  She does have intermittent issues with pain/coldness in her toes bilaterally.  This is not severe or persistent.  She has no tissue loss -On exam her left groin incision is well-healed.  Her lower extremities are warm and well-perfused with brisk DP/PT Doppler signals -She states that she still feels very fatigued from surgery and does not feel ready to go back to work.  I have given her a return to work letter for January 31, 2024 -She would like to discontinue some of her medications when possible due to easy bruising.  At this time I have told her to continue Eliquis, aspirin, and Plavix.  She can return to clinic in 6 months with ABIs, left aortoiliac duplex, and left lower extremity DVT study.  As long as her DVT study appears stable and her symptoms are well-controlled, she can likely discontinue her Eliquis   Loel Dubonnet, PA-C [facility] [phone]   Clinic MD:  [facility]

## 2023-12-30 ENCOUNTER — Other Ambulatory Visit: Payer: Self-pay | Admitting: *Deleted

## 2023-12-30 DIAGNOSIS — I745 Embolism and thrombosis of iliac artery: Secondary | ICD-10-CM

## 2023-12-30 DIAGNOSIS — I70229 Atherosclerosis of native arteries of extremities with rest pain, unspecified extremity: Secondary | ICD-10-CM

## 2023-12-30 DIAGNOSIS — I739 Peripheral vascular disease, unspecified: Secondary | ICD-10-CM

## 2023-12-30 DIAGNOSIS — I82402 Acute embolism and thrombosis of unspecified deep veins of left lower extremity: Secondary | ICD-10-CM

## 2024-01-12 ENCOUNTER — Encounter: Payer: Self-pay | Admitting: Physician Assistant

## 2024-01-12 ENCOUNTER — Encounter: Payer: Self-pay | Admitting: Vascular Surgery

## 2024-01-31 ENCOUNTER — Other Ambulatory Visit (HOSPITAL_COMMUNITY): Payer: Self-pay

## 2024-02-07 ENCOUNTER — Emergency Department (HOSPITAL_COMMUNITY)

## 2024-02-07 ENCOUNTER — Encounter (HOSPITAL_COMMUNITY): Payer: Self-pay | Admitting: Emergency Medicine

## 2024-02-07 ENCOUNTER — Emergency Department (HOSPITAL_COMMUNITY)
Admission: EM | Admit: 2024-02-07 | Discharge: 2024-02-07 | Disposition: A | Discharge status: Home / Self Care | Attending: Emergency Medicine | Admitting: Emergency Medicine

## 2024-02-07 DIAGNOSIS — M79605 Pain in left leg: Secondary | ICD-10-CM

## 2024-02-07 DIAGNOSIS — M79662 Pain in left lower leg: Secondary | ICD-10-CM | POA: Diagnosis not present

## 2024-02-07 DIAGNOSIS — Z7982 Long term (current) use of aspirin: Secondary | ICD-10-CM | POA: Insufficient documentation

## 2024-02-07 DIAGNOSIS — M545 Low back pain, unspecified: Secondary | ICD-10-CM | POA: Insufficient documentation

## 2024-02-07 DIAGNOSIS — M79661 Pain in right lower leg: Secondary | ICD-10-CM | POA: Diagnosis not present

## 2024-02-07 DIAGNOSIS — R1084 Generalized abdominal pain: Secondary | ICD-10-CM | POA: Insufficient documentation

## 2024-02-07 DIAGNOSIS — Z7901 Long term (current) use of anticoagulants: Secondary | ICD-10-CM | POA: Insufficient documentation

## 2024-02-07 LAB — COMPREHENSIVE METABOLIC PANEL WITH GFR
ALT: 12 U/L (ref 0–44)
AST: 14 U/L — ABNORMAL LOW (ref 15–41)
Albumin: 3.5 g/dL (ref 3.5–5.0)
Alkaline Phosphatase: 76 U/L (ref 38–126)
Anion gap: 11 (ref 5–15)
BUN: 12 mg/dL (ref 8–23)
CO2: 22 mmol/L (ref 22–32)
Calcium: 9.6 mg/dL (ref 8.9–10.3)
Chloride: 104 mmol/L (ref 98–111)
Creatinine, Ser: 1.22 mg/dL — ABNORMAL HIGH (ref 0.44–1.00)
GFR, Estimated: 49 mL/min — ABNORMAL LOW (ref >60)
Glucose, Bld: 135 mg/dL — ABNORMAL HIGH (ref 70–99)
Potassium: 3.8 mmol/L (ref 3.5–5.1)
Sodium: 137 mmol/L (ref 135–145)
Total Bilirubin: 0.7 mg/dL (ref 0.0–1.2)
Total Protein: 7 g/dL (ref 6.5–8.1)

## 2024-02-07 LAB — LIPASE, BLOOD: Lipase: 30 U/L (ref 11–51)

## 2024-02-07 LAB — CBC WITH DIFFERENTIAL/PLATELET
Abs Immature Granulocytes: 0.03 10*3/uL (ref 0.00–0.07)
Basophils Absolute: 0.1 10*3/uL (ref 0.0–0.1)
Basophils Relative: 1 %
Eosinophils Absolute: 0.2 10*3/uL (ref 0.0–0.5)
Eosinophils Relative: 3 %
HCT: 42.5 % (ref 36.0–46.0)
Hemoglobin: 14.2 g/dL (ref 12.0–15.0)
Immature Granulocytes: 0 %
Lymphocytes Relative: 17 %
Lymphs Abs: 1.4 10*3/uL (ref 0.7–4.0)
MCH: 31 pg (ref 26.0–34.0)
MCHC: 33.4 g/dL (ref 30.0–36.0)
MCV: 92.8 fL (ref 80.0–100.0)
Monocytes Absolute: 0.7 10*3/uL (ref 0.1–1.0)
Monocytes Relative: 8 %
Neutro Abs: 6 10*3/uL (ref 1.7–7.7)
Neutrophils Relative %: 71 %
Platelets: 269 10*3/uL (ref 150–400)
RBC: 4.58 MIL/uL (ref 3.87–5.11)
RDW: 13.2 % (ref 11.5–15.5)
WBC: 8.5 10*3/uL (ref 4.0–10.5)
nRBC: 0 % (ref 0.0–0.2)

## 2024-02-07 MED ORDER — DEXAMETHASONE SODIUM PHOSPHATE 10 MG/ML IJ SOLN
8.0000 mg | Freq: Once | INTRAMUSCULAR | Status: AC
  Administered 2024-02-07: 8 mg via INTRAVENOUS
  Filled 2024-02-07: qty 1

## 2024-02-07 MED ORDER — GABAPENTIN 300 MG PO CAPS: 300.0000 mg | ORAL_CAPSULE | Freq: Three times a day (TID) | ORAL | 0 refills | Status: DC | PRN

## 2024-02-07 MED ORDER — IOHEXOL 350 MG/ML SOLN
60.0000 mL | Freq: Once | INTRAVENOUS | Status: AC | PRN
  Administered 2024-02-07: 60 mL via INTRAVENOUS

## 2024-02-07 MED ORDER — IOHEXOL 350 MG/ML SOLN: 60.0000 mL | Freq: Once | INTRAVENOUS | Status: DC | PRN

## 2024-02-07 MED ORDER — OXYCODONE HCL 5 MG PO TABS
5.0000 mg | ORAL_TABLET | ORAL | Status: DC | PRN
  Administered 2024-02-07: 5 mg via ORAL
  Filled 2024-02-07: qty 1

## 2024-02-07 NOTE — ED Provider Notes (Signed)
 [place] EMERGENCY DEPARTMENT AT [facility] Provider Note   CSN: [id number] Arrival date & time: 02/07/24  1324     History  Chief Complaint  Patient presents with   Abdominal Pain   Back Pain    Zoe Snyder is a 68 y.o. female who is s/p left common iliac artery angioplasty and stenting with left common femoral artery cutdown and direct repair on 11/18/2023 by Dr.Hawken, hx of large proximal left DVT, on eliquis , aspirin  and plavex triple therapy, presenting to ED with back pain and abdominal and leg pain.  Patient has multiple concerns.  She reports she returned to work last week after a few months off for her surgeries; she stands 12 hours at work, and had significant pain in both her legs and her lower back.  She suffers from chronic back pain but "this is so much worse."  She reports her lower abdomen feels "bloated like there's bleeding inside."  She reports bruising all over her body.  Denies head injuries or headache.  Denies dark or bloody stools.   Reports her leg pain is more tolerable at rest but "gets bad when I have to walk up stairs or go to work and stand all day."  I reviewed her records - she was seen by vas surgery for follow up 12/28/23 in the office, noting extremities warm and well perfused and continued fatigue and pain with exertion.  Plan was to return in 6 months to clinic for ABI's, aortoiliac left duplex and lower extremity DVT studies, at which time she may be a candidate to discontinue eliquis .  HPI     Home Medications Prior to Admission medications   Medication Sig Start Date End Date Taking? Authorizing Provider  gabapentin (NEURONTIN) 300 MG capsule Take 1 capsule (300 mg total) by mouth 3 (three) times daily as needed for up to 21 doses. 02/07/24  Yes Arvilla Birmingham, MD  albuterol  (PROVENTIL ) (2.5 MG/3ML) 0.083% nebulizer solution Inhale 3 mLs (2.5 mg total) by nebulization every 6 (six) hours as needed for wheezing or shortness of  breath. 03/16/23   Buena Carmine, NP  albuterol  (VENTOLIN  HFA) 108 (90 Base) MCG/ACT inhaler Inhale 2 puffs into the lungs every 4 (four) hours as needed for wheezing or shortness of breath. 03/16/23   Buena Carmine, NP  amLODipine  (NORVASC ) 10 MG tablet Take 1 tablet (10 mg total) by mouth daily. Due for follow up visit 08/05/21   Jerilynn Montenegro, MD  apixaban  (ELIQUIS ) 5 MG TABS tablet Take 1 tablet (5 mg total) by mouth 2 (two) times daily. 12/16/23   Cordie Deters, PA-C  APIXABAN  (ELIQUIS ) VTE STARTER PACK (10MG  AND 5MG ) Take as directed on package: start with two-5mg  tablets twice daily for 7 days. On day 8, switch to one-5mg  tablet twice daily. 11/19/23   Carlene Che, MD  aspirin  EC 81 MG tablet Take 1 tablet (81 mg total) by mouth daily at 6 (six) AM. Swallow whole. 11/20/23   Cordie Deters, PA-C  atorvastatin  (LIPITOR) 20 MG tablet Take 20 mg by mouth at bedtime.    [provider]  Cholecalciferol  (VITAMIN D -1000 MAX ST) 25 MCG (1000 UT) tablet Take 1 tablet (1,000 Units total) by mouth daily. 08/13/23   Sandi Crosby, PA-C  clopidogrel  (PLAVIX ) 75 MG tablet Take 1 tablet (75 mg total) by mouth daily. 12/03/23   Baglia, Corrina, PA-C  fluticasone  furoate-vilanterol (BREO ELLIPTA ) 100-25 MCG/ACT AEPB Inhale 1 puff into the lungs daily.  10/01/22   Aram Knights, MD  ipratropium-albuterol  (DUONEB) 0.5-2.5 (3) MG/3ML SOLN Take 3 mLs by nebulization every 4 (four) hours as needed. 10/22/23   Eloise Hake Scales, PA-C  levothyroxine  (SYNTHROID ) 75 MCG tablet Take 75 mcg by mouth every morning. 06/21/23   [provider]  methylPREDNISolone  (MEDROL ) 4 MG tablet Take 4 mg by mouth See admin instructions. Take 6 tablets on day 1 Take 5 tablets on day 2 Take 4 tablets on day 3  Take 3 tablets on day 4 Take 2 tablets on day 5 Take 1 tablet on day 6 Patient not taking: Reported on 11/19/2023 10/22/23   [provider]  Multiple Vitamin (MULTI-VITAMIN) tablet Take 1  tablet by mouth daily. 05/08/16   [provider]  oxyCODONE  (OXY IR/ROXICODONE ) 5 MG immediate release tablet Take 1 tablet (5 mg total) by mouth every 6 (six) hours as needed for moderate pain (pain score 4-6). Patient not taking: Reported on 12/28/2023 11/19/23   Cordie Deters, PA-C  predniSONE  (DELTASONE ) 20 MG tablet Take 40 mg by mouth daily with breakfast. For 5 days. Patient not taking: Reported on 11/19/2023    [provider]  promethazine -dextromethorphan (PROMETHAZINE -DM) 6.25-15 MG/5ML syrup Take 5 mLs by mouth at bedtime as needed for cough.    [provider]  umeclidinium bromide  (INCRUSE ELLIPTA ) 62.5 MCG/ACT AEPB Inhale 1 puff into the lungs daily. 10/02/22   Guilloud, Carolyn, MD  valsartan (DIOVAN) 80 MG tablet Take 80 mg by mouth daily. 09/29/22   [provider]      Allergies    Epinephrine, Novocain [procaine], and Theraflu severe cold daytime [dm-phenylephrine -acetaminophen ]    Review of Systems   Review of Systems  Physical Exam Updated Vital Signs BP (!) 121/96   Pulse 81   Temp 98.1 F (36.7 C) (Oral)   Resp 18   Ht 5\' 1"  (1.549 m)   Wt 84 kg   SpO2 100%   BMI 34.99 kg/m  Physical Exam Constitutional:      General: She is not in acute distress. HENT:     Head: Normocephalic and atraumatic.  Eyes:     Conjunctiva/sclera: Conjunctivae normal.     Pupils: Pupils are equal, round, and reactive to light.  Cardiovascular:     Rate and Rhythm: Normal rate and regular rhythm.     Comments: Extremities warm and well perfused, +pedal pulse, no discoloration of foot or toes Pulmonary:     Effort: Pulmonary effort is normal. No respiratory distress.  Abdominal:     General: There is no distension.     Tenderness: There is generalized abdominal tenderness. There is no guarding or rebound.  Skin:    General: Skin is warm and dry.  Neurological:     General: No focal deficit present.     Mental Status: She is alert. Mental  status is at baseline.  Psychiatric:        Mood and Affect: Mood normal.        Behavior: Behavior normal.     ED Results / Procedures / Treatments   Labs (all labs ordered are listed, but only abnormal results are displayed) Labs Reviewed  COMPREHENSIVE METABOLIC PANEL WITH GFR - Abnormal; Notable for the following components:      Result Value   Glucose, Bld 135 (*)    Creatinine, Ser 1.22 (*)    AST 14 (*)    GFR, Estimated 49 (*)    All other components within normal limits  CBC WITH DIFFERENTIAL/PLATELET  LIPASE, BLOOD    EKG None  Radiology VAS US  LOWER EXTREMITY VENOUS (DVT) (ONLY [facility] & [facility]) Result Date: 02/07/2024  Lower Venous DVT Study Patient Name:  NAUTICA FAMIGLIETTI [facility]  Date of Exam:   02/07/2024 Medical Rec #: [id number]           Accession #:    [id number] Date of Birth: 01/23/1956           Patient Gender: F Patient Age:   45 years Exam Location:  [facility] Procedure:      VAS US  LOWER EXTREMITY VENOUS (DVT) Referring Phys: Arelene Moroni --------------------------------------------------------------------------------  Indications: Bilateral lower extremity pain and abdominal pain.  Risk Factors: Cancer History of larynx cancer. DVT Left femoral and popliteal vein 11/18/2023 Exposure of left common femoral artery with left common iliac artery angioplasty and stenting by Dr. Edgardo Goodwill on 11/18/2023 secondary to acute left limb ischemia. Anticoagulation: Eliquis . Limitations: Poor ultrasound/tissue interface, body habitus and Patient unable to tolerate some compression maneuvers. Comparison Study: Prior LEV done 12/08/23 Performing Technologist: Carleene Chase RVS  Examination Guidelines: A complete evaluation includes B-mode imaging, spectral Doppler, color Doppler, and power Doppler as needed of all accessible portions of each vessel. Bilateral testing is considered an integral part of a complete examination. Limited examinations for reoccurring indications may be performed as  noted. The reflux portion of the exam is performed with the patient in reverse Trendelenburg.  +---------+---------------+---------+-----------+----------+--------------+ RIGHT    CompressibilityPhasicitySpontaneityPropertiesThrombus Aging +---------+---------------+---------+-----------+----------+--------------+ CFV      Full           Yes      Yes                                 +---------+---------------+---------+-----------+----------+--------------+ SFJ      Full                                                        +---------+---------------+---------+-----------+----------+--------------+ FV Prox  Full                                                        +---------+---------------+---------+-----------+----------+--------------+ FV Mid   Full                                                        +---------+---------------+---------+-----------+----------+--------------+ FV DistalFull                                                        +---------+---------------+---------+-----------+----------+--------------+ PFV      Full                                                        +---------+---------------+---------+-----------+----------+--------------+  POP      Full           Yes      Yes                                 +---------+---------------+---------+-----------+----------+--------------+ PTV      Full                                                        +---------+---------------+---------+-----------+----------+--------------+ PERO     Full                                                        +---------+---------------+---------+-----------+----------+--------------+   +---------+---------------+---------+-----------+----------+--------------+ LEFT     CompressibilityPhasicitySpontaneityPropertiesThrombus Aging +---------+---------------+---------+-----------+----------+--------------+ CFV      Full            Yes      Yes                                 +---------+---------------+---------+-----------+----------+--------------+ SFJ      Full                                                        +---------+---------------+---------+-----------+----------+--------------+ FV Prox  Full           Yes      Yes                                 +---------+---------------+---------+-----------+----------+--------------+ FV Mid   Full                                                        +---------+---------------+---------+-----------+----------+--------------+ FV Distal               No       Yes                  Chronic        +---------+---------------+---------+-----------+----------+--------------+ PFV      Full                                                        +---------+---------------+---------+-----------+----------+--------------+ POP      Partial        No       Yes                  Chronic        +---------+---------------+---------+-----------+----------+--------------+ PTV      Full                                                        +---------+---------------+---------+-----------+----------+--------------+  PERO     Full                                                        +---------+---------------+---------+-----------+----------+--------------+     Summary: RIGHT: - There is no evidence of deep vein thrombosis in the lower extremity.  - No cystic structure found in the popliteal fossa.  LEFT: - Findings consistent with chronic deep vein thrombosis involving the left femoral vein, and left popliteal vein.  - Findings appear improved from previous examination. - No cystic structure found in the popliteal fossa.  *See table(s) above for measurements and observations. Electronically signed by Irvin Mantel on 02/07/2024 at 2:25:57 PM.    Final    CT L-SPINE NO CHARGE Result Date: 02/07/2024 CLINICAL DATA:  Abdominal pain. EXAM: CT LUMBAR  SPINE WITHOUT CONTRAST TECHNIQUE: Multidetector CT imaging of the lumbar spine was performed without intravenous contrast administration. Multiplanar CT image reconstructions were also generated. RADIATION DOSE REDUCTION: This exam was performed according to the departmental dose-optimization program which includes automated exposure control, adjustment of the mA and/or kV according to patient size and/or use of iterative reconstruction technique. COMPARISON:  CT abdomen pelvis dated 02/07/2024. FINDINGS: Segmentation: 5 lumbar type vertebrae. Alignment: No acute subluxation. Vertebrae: No acute fracture. Evaluation however is limited due to osteopenia and grainy image quality. Paraspinal and other soft tissues: No perispinal fluid collection or hematoma. Disc levels: No acute findings. Multilevel degenerative changes primarily at L5-S1. IMPRESSION: 1. No acute/traumatic lumbar spine pathology. 2. Multilevel degenerative changes primarily at L5-S1. Electronically Signed   By: Angus Bark M.D.   On: 02/07/2024 14:07   CT ABDOMEN PELVIS W CONTRAST Result Date: 02/07/2024 CLINICAL DATA:  Abdominal pain EXAM: CT ABDOMEN AND PELVIS WITH CONTRAST TECHNIQUE: Multidetector CT imaging of the abdomen and pelvis was performed using the standard protocol following bolus administration of intravenous contrast. RADIATION DOSE REDUCTION: This exam was performed according to the departmental dose-optimization program which includes automated exposure control, adjustment of the mA and/or kV according to patient size and/or use of iterative reconstruction technique. CONTRAST:  60mL OMNIPAQUE  IOHEXOL  350 MG/ML SOLN COMPARISON:  01/18/2021 FINDINGS: Lower chest: No acute abnormality Hepatobiliary: No focal hepatic abnormality. Gallbladder unremarkable. Pancreas: No focal abnormality or ductal dilatation. Spleen: No focal abnormality.  Normal size. Adrenals/Urinary Tract: Areas of cortical thinning/scarring bilaterally. No renal  or adrenal mass. No stones or hydronephrosis. Urinary bladder unremarkable. Stomach/Bowel: Colonic diverticulosis, most pronounced in the left colon. No active diverticulitis. Stomach and small bowel decompressed. No bowel obstruction or inflammatory process. Normal appendix. Vascular/Lymphatic: Diffuse aortoiliac atherosclerosis. No evidence of aneurysm or adenopathy. Reproductive: Uterus and adnexa unremarkable.  No mass. Other: No free fluid or free air. Musculoskeletal: No acute bony abnormality. IMPRESSION: No acute findings in the abdomen or pelvis. Colonic diverticulosis, most pronounced in the left colon. No active diverticulitis. Aortoiliac atherosclerosis. Electronically Signed   By: Janeece Mechanic M.D.   On: 02/07/2024 13:42    Procedures Procedures    Medications Ordered in ED Medications  iohexol  (OMNIPAQUE ) 350 MG/ML injection 60 mL (has no administration in time range)  dexamethasone  (DECADRON ) injection 8 mg (has no administration in time range)  iohexol  (OMNIPAQUE ) 350 MG/ML injection 60 mL (60 mLs Intravenous Contrast Given 02/07/24 1154)    ED Course/ Medical Decision Making/ A&P  Medical Decision Making Amount and/or Complexity of Data Reviewed Labs: ordered. Radiology: ordered.  Risk Prescription drug management.   This patient presents to the ED with concern for acute on chronic low back pain, abdominal pain and discomfort, leg pain with ambulation and standing. This involves an extensive number of treatment options, and is a complaint that carries with it a high risk of complications and morbidity.    I suspect is medically have different etiologies.  She has chronic low back pain has been spending more time on her feet, particularly at work, and has also returned to work as nearly 2 months off and resting.  Spending 12 hours in her feet at work would likely exacerbate both low back pain as well as chronic claudication pain of the lower  extremities.  Some of her back pain may be related to radiculopathy from the lumbar spine.  Difficult to tease this out.  She does asked me for pain medicine, says that no one is prescribing her pain medicine; she cannot have NSAIDs because of her anticoagulation and is already taking Tylenol .  We will give oxycodone  here, but I explained that this is a short-term prescription only for acute pain.  She does have some small bruising on her body, no large ecchymoses.  This is likely related to being on triple anticoagulation and antiplatelet therapy.  We will recheck her hemoglobin and platelet count here.  Her lower extremities appear warm and well-perfused.  She has a palpable pedal pulse.  She has brisk cap refills in her toes.  I do not see evidence of acute critical ischemia of the lower extremities.  I do think a repeat DVT ultrasound to be reasonable at this point, bilaterally in this case that she says that she now has "new pain" in her right leg that she did not have before.  Co-morbidities that complicate the patient evaluation: History of claudication and DVT at high risk of thrombosis, anticoagulation therapy at high risk of bleeding and thrombocytopenia  External records from outside source obtained and reviewed including vascular surgery evaluation most recent hospital discharge summary  I ordered and personally interpreted labs.  The pertinent results include: No emergent findings.  Specifically hemoglobin and platelets normal  I ordered imaging studies including ct abdomen pelvis, dvt ultrasound bilateral lower extremites I independently visualized and interpreted imaging which showed no emergent findings.  Chronic thrombus noted in left lower extremity. I agree with the radiologist interpretation  I ordered medication including oxycodone  for pain.  Decadron  for suspected neuropathic pain  I have reviewed the patients home medicines and have made adjustments as needed  Test  Considered: No indication for angiogram imaging at this time.  Patient's extremities are well-perfused.  Doubt emergency claudication   After the interventions noted above, I reevaluated the patient and found that they have: improved  On my reassessment of the patient, her primary complaint appears to be entire body pain.  This is worse when she is standing on her feet all day.  I suspect this is multifactorial, related to chronic low back problems or sciatica which she has had in the past, as well as some claudication type pain from being on her feet all day, edema in her lower extremities from standing.  She is also having soreness in her arms for lifting boxes at work.  She did recently restart work after being off for quite a period of time, she works long shifts.  She understands that opioid medications of side effects  including the risks of addiction and dependency and are not a fix for her pain.  She asks about alternative medications.  We can try an injection of steroids and gabapentin for potential low back pain which appears to be the predominant cause of her pain.  She cannot have NSAIDs but she can have Tylenol  at home.  I advised that she follow-up with her primary care provider in the office about alternative pain medications.  She verbalized understanding  Dispostion:  After consideration of the diagnostic results and the patients response to treatment, I feel that the patent would benefit from close outpatient follow-up         Final Clinical Impression(s) / ED Diagnoses Final diagnoses:  Acute midline low back pain without sciatica  Pain in both lower extremities    Rx / DC Orders ED Discharge Orders          Ordered    gabapentin (NEURONTIN) 300 MG capsule  3 times daily PRN        02/07/24 1512              Arvilla Birmingham, MD 02/07/24 1558

## 2024-02-07 NOTE — ED Notes (Signed)
 Pt returned from [place]

## 2024-02-07 NOTE — ED Notes (Signed)
 Pt to [place] via stretcher

## 2024-02-07 NOTE — Progress Notes (Signed)
 VASCULAR LAB    Bilateral lower extremity venous duplex has been performed.  See CV proc for preliminary results.  Relayed results to Dr. Gordon Latus via secure chat  Lanette Pipe, [facility], RVT 02/07/2024, 11:01 AM

## 2024-02-07 NOTE — Discharge Instructions (Addendum)
 Please follow-up with your primary care doctor to discuss further pain management options.  There are more chronic pain medicines including Cymbalta, Lyrica, and other types of "atypical" pain medications that can be prescribed by outpatient offices, including by pain management specialist.  This decision will need to be made between you and your primary care provider or pain specialist.  We did give you a shot of dexamethasone  which is a steroid that will last likely the next 2 to 3 days.  We will also try gabapentin Neurontin which is a nerve pain medication that may help with the lower back pain.  This medicine can make you drowsy.  Do not mix it with alcohol or any narcotic or sedative medicines.  Come back to the ER if you have worsening pain in your legs, including discoloration or change of color in your toes or feet, loss of feeling in your feet, or any other emergency medical concerns.

## 2024-02-07 NOTE — ED Triage Notes (Signed)
 Pt reports vascular surgery 2/6 due to blood clot in left leg. States she was cleared to return to work 4/21 and while at work, pain was unbearable. Pt states her lower abd hurts and worried about internal bleeding. Also having chronic lower back pains. Reports she still has bruising and taking blood thinners.

## 2024-02-18 ENCOUNTER — Telehealth: Payer: Self-pay

## 2024-02-18 NOTE — Telephone Encounter (Signed)
 Pt called c/o muscle pain, weakness, "feels like blood flowing everywhere", "pain worse than before surgery", bilateral pain when walking and standing, bruising all over, hurting in back and chest.  Pt reported no foot or leg discoloration, warm extremities and no swelling.  Pt went to ED on 02/07/24.  CT imaging done with "No acute findings in the abdomen or pelvis.  Colonic diverticulosis, most pronounced in the left colon. No active diverticulitis. Aortoiliac atherosclerosis", "no acute/traumatic lumbar spine pathology." BLE pulses palpable.  Pt instructed to follow up with her PCP to manage pain and request a note to delay returning to work.    Pt instructed to stay active and walk regularly.   Pt encouraged to stop smoking.  Pt instructed to go to ED or call the office if pain becomes intolerable, discoloration of lower extremities and/or swelling occurs, non-healing ulcers develop.  Pt stated she understood instructions.

## 2024-02-28 ENCOUNTER — Other Ambulatory Visit: Payer: Self-pay | Admitting: Physician Assistant

## 2024-03-28 ENCOUNTER — Other Ambulatory Visit: Payer: Self-pay

## 2024-03-28 MED ORDER — APIXABAN 5 MG PO TABS: 5.0000 mg | ORAL_TABLET | Freq: Two times a day (BID) | ORAL | 11 refills | Status: DC

## 2024-04-12 ENCOUNTER — Other Ambulatory Visit: Payer: Self-pay

## 2024-04-12 ENCOUNTER — Emergency Department (HOSPITAL_COMMUNITY)

## 2024-04-12 ENCOUNTER — Encounter (HOSPITAL_COMMUNITY): Payer: Self-pay

## 2024-04-12 ENCOUNTER — Emergency Department (HOSPITAL_COMMUNITY)
Admission: EM | Admit: 2024-04-12 | Discharge: 2024-04-13 | Disposition: A | Discharge status: Home / Self Care | Attending: Student | Admitting: Student

## 2024-04-12 DIAGNOSIS — Z79899 Other long term (current) drug therapy: Secondary | ICD-10-CM | POA: Insufficient documentation

## 2024-04-12 DIAGNOSIS — Z7982 Long term (current) use of aspirin: Secondary | ICD-10-CM | POA: Insufficient documentation

## 2024-04-12 DIAGNOSIS — Z7901 Long term (current) use of anticoagulants: Secondary | ICD-10-CM | POA: Diagnosis not present

## 2024-04-12 DIAGNOSIS — Z8521 Personal history of malignant neoplasm of larynx: Secondary | ICD-10-CM | POA: Insufficient documentation

## 2024-04-12 DIAGNOSIS — Z7951 Long term (current) use of inhaled steroids: Secondary | ICD-10-CM | POA: Insufficient documentation

## 2024-04-12 DIAGNOSIS — K2980 Duodenitis without bleeding: Secondary | ICD-10-CM | POA: Insufficient documentation

## 2024-04-12 DIAGNOSIS — I10 Essential (primary) hypertension: Secondary | ICD-10-CM | POA: Diagnosis not present

## 2024-04-12 DIAGNOSIS — M7981 Nontraumatic hematoma of soft tissue: Secondary | ICD-10-CM | POA: Insufficient documentation

## 2024-04-12 DIAGNOSIS — T148XXA Other injury of unspecified body region, initial encounter: Secondary | ICD-10-CM

## 2024-04-12 DIAGNOSIS — J449 Chronic obstructive pulmonary disease, unspecified: Secondary | ICD-10-CM | POA: Diagnosis not present

## 2024-04-12 LAB — COMPREHENSIVE METABOLIC PANEL WITH GFR
ALT: 12 U/L (ref 0–44)
AST: 17 U/L (ref 15–41)
Albumin: 3.7 g/dL (ref 3.5–5.0)
Alkaline Phosphatase: 65 U/L (ref 38–126)
Anion gap: 9 (ref 5–15)
BUN: 27 mg/dL — ABNORMAL HIGH (ref 8–23)
CO2: 23 mmol/L (ref 22–32)
Calcium: 9.1 mg/dL (ref 8.9–10.3)
Chloride: 108 mmol/L (ref 98–111)
Creatinine, Ser: 1.55 mg/dL — ABNORMAL HIGH (ref 0.44–1.00)
GFR, Estimated: 36 mL/min — ABNORMAL LOW (ref >60)
Glucose, Bld: 131 mg/dL — ABNORMAL HIGH (ref 70–99)
Potassium: 5 mmol/L (ref 3.5–5.1)
Sodium: 140 mmol/L (ref 135–145)
Total Bilirubin: 1 mg/dL (ref 0.0–1.2)
Total Protein: 6.5 g/dL (ref 6.5–8.1)

## 2024-04-12 LAB — URINALYSIS, ROUTINE W REFLEX MICROSCOPIC
Bilirubin Urine: NEGATIVE
Glucose, UA: NEGATIVE mg/dL
Ketones, ur: NEGATIVE mg/dL
Nitrite: NEGATIVE
Protein, ur: 30 mg/dL — AB
Specific Gravity, Urine: 1.019 (ref 1.005–1.030)
pH: 5 (ref 5.0–8.0)

## 2024-04-12 LAB — LIPASE, BLOOD: Lipase: 39 U/L (ref 11–51)

## 2024-04-12 MED ORDER — IOHEXOL 350 MG/ML SOLN
75.0000 mL | Freq: Once | INTRAVENOUS | Status: AC | PRN
  Administered 2024-04-12: 75 mL via INTRAVENOUS

## 2024-04-12 NOTE — ED Triage Notes (Signed)
 Pt c.o bilateral leg pain and random bruising all over her body. States she takes 2 blood thinners from having a blood clot in her left leg and having a stent placed. Pt also c.o vomiting and abd pain

## 2024-04-12 NOTE — ED Provider Triage Note (Signed)
 Emergency Medicine Provider Triage Evaluation Note  Zoe Snyder , a 68 y.o. female  was evaluated in triage.  Pt complains of generalized abdominal pain has been bothering her for couple of weeks.  She reports associated cramping and episodes where she is having bowel incontinence.  She denies any melena or hematochezia.  Patient does take Eliquis  secondary to a DVT.  She does endorse some increased level of bruising to her extremities.  Review of Systems  Positive:  Negative: See above   Physical Exam  BP 129/69 (BP Location: Right Arm)   Pulse 90   Temp 98.4 F (36.9 C)   Resp 17   Ht 5' 3.5 (1.613 m)   Wt 83.9 kg   SpO2 94%   BMI 32.26 kg/m  Gen:   Awake, no distress   Resp:  Normal effort  MSK:   Moves extremities without difficulty  Other:  Mild diffuse abdominal tenderness  Medical Decision Making  Medically screening exam initiated at 6:21 PM.  Appropriate orders placed.  Zoe Snyder was informed that the remainder of the evaluation will be completed by another provider, this initial triage assessment does not replace that evaluation, and the importance of remaining in the ED until their evaluation is complete.     Theotis Peers [place], [place] 04/12/24 TRENNA

## 2024-04-12 NOTE — ED Triage Notes (Signed)
 Pt to er, pt states that she had a blood clot in February and they put a stent in, states that she is now on blood thinners, states that she is here because she is having a lot of issues. States that she is here for a bruise on the back of her R leg, states that it hurts when she walks, states that she is getting lots of bruises.

## 2024-04-13 LAB — CBC WITH DIFFERENTIAL/PLATELET
Abs Immature Granulocytes: 0.02 10*3/uL (ref 0.00–0.07)
Basophils Absolute: 0.1 10*3/uL (ref 0.0–0.1)
Basophils Relative: 1 %
Eosinophils Absolute: 0.2 10*3/uL (ref 0.0–0.5)
Eosinophils Relative: 2 %
HCT: 37 % (ref 36.0–46.0)
Hemoglobin: 12 g/dL (ref 12.0–15.0)
Immature Granulocytes: 0 %
Lymphocytes Relative: 22 %
Lymphs Abs: 2.2 10*3/uL (ref 0.7–4.0)
MCH: 30.6 pg (ref 26.0–34.0)
MCHC: 32.4 g/dL (ref 30.0–36.0)
MCV: 94.4 fL (ref 80.0–100.0)
Monocytes Absolute: 0.8 10*3/uL (ref 0.1–1.0)
Monocytes Relative: 8 %
Neutro Abs: 6.6 10*3/uL (ref 1.7–7.7)
Neutrophils Relative %: 67 %
Platelets: 234 10*3/uL (ref 150–400)
RBC: 3.92 MIL/uL (ref 3.87–5.11)
RDW: 13.2 % (ref 11.5–15.5)
WBC: 9.9 10*3/uL (ref 4.0–10.5)
nRBC: 0 % (ref 0.0–0.2)

## 2024-04-13 LAB — PROTIME-INR
INR: 1.2 (ref 0.8–1.2)
Prothrombin Time: 15.9 s — ABNORMAL HIGH (ref 11.4–15.2)

## 2024-04-13 MED ORDER — FAMOTIDINE 20 MG PO TABS: 20.0000 mg | ORAL_TABLET | Freq: Every day | ORAL | 0 refills | Status: DC

## 2024-04-13 MED ORDER — ALUM & MAG HYDROXIDE-SIMETH 200-200-20 MG/5ML PO SUSP
30.0000 mL | Freq: Once | ORAL | Status: AC
  Administered 2024-04-13: 30 mL via ORAL
  Filled 2024-04-13: qty 30

## 2024-04-13 MED ORDER — PANTOPRAZOLE SODIUM 40 MG IV SOLR
40.0000 mg | Freq: Once | INTRAVENOUS | Status: AC
  Administered 2024-04-13: 40 mg via INTRAVENOUS
  Filled 2024-04-13: qty 10

## 2024-04-13 MED ORDER — PANTOPRAZOLE SODIUM 20 MG PO TBEC: 20.0000 mg | DELAYED_RELEASE_TABLET | Freq: Every day | ORAL | 0 refills | Status: AC

## 2024-04-13 MED ORDER — FAMOTIDINE IN NACL 20-0.9 MG/50ML-% IV SOLN
20.0000 mg | Freq: Once | INTRAVENOUS | Status: AC
  Administered 2024-04-13: 20 mg via INTRAVENOUS
  Filled 2024-04-13: qty 50

## 2024-04-13 MED ORDER — LIDOCAINE VISCOUS HCL 2 % MT SOLN
15.0000 mL | Freq: Once | OROMUCOSAL | Status: DC
  Filled 2024-04-13: qty 15

## 2024-04-13 NOTE — Discharge Instructions (Signed)
 You are seen today here for duodenitis as well as bruising to the right lower leg.  The duodenitis will be most helped by the Protonix  and the famotidine  that I am prescribing for you.  Recommend you continue follow-up with your PCP for further evaluation of this and to ensure proper treatment.  Also recommend that you continue to follow-up with your surgery team for continued evaluation.  Recommend you continue to place ice over bruises, with barrier in between keeping on for alternating 15 minutes on, 15 minutes off for 1 hour.  Return to the ED if you into any new or worsening symptoms which include blood in stool, black stools, shortness of breath, abdominal pain that is uncontrolled.

## 2024-04-13 NOTE — ED Provider Notes (Signed)
 [place] EMERGENCY DEPARTMENT AT [facility] Provider Note   CSN: [id number] Arrival date & time: 04/12/24  1626     Patient presents with: Leg Pain, Bleeding/Bruising, and Emesis   Zoe Snyder is a 68 y.o. female.   Leg Pain Emesis Associated symptoms: abdominal pain and myalgias    Patient is a 69 year old female to the ED today with concerns for multiple complaints.  Including abdominal pain, right leg pain during walking, and persistent bruising which have been present since her stent placement for her limb ischemia on her left lower extremity which was on 11/18/2023.  She is particularly concerned about 1 area on her right lower extremity on her calf, having been present for the last 2 days.  Denies any injury to the area.  Previous medical history of HTN, throat cancer, alcohol abuse, COPD, cocaine abuse, Hashimoto's thyroiditis, limb ischemia and left lower extremity with stent placement.  Notes that she had previously had leg pain at this and was seen in April, though was not as bad.  Notes that the abdominal pain has been present consistently and accompanied with diarrhea that she describes as watery, denying hematochezia, melena.  Denies headache, vision changes, fever, dysphagia, chest pain, shortness of breath, nausea, vomiting, dysuria, numbness, weakness, tingling.      Prior to Admission medications   Medication Sig Start Date End Date Taking? Authorizing Provider  famotidine  (PEPCID ) 20 MG tablet Take 1 tablet (20 mg total) by mouth daily. 04/13/24  Yes Tonnie Friedel S, PA-C  pantoprazole  (PROTONIX ) 20 MG tablet Take 1 tablet (20 mg total) by mouth daily. 04/13/24  Yes Aysha Livecchi S, PA-C  albuterol  (PROVENTIL ) (2.5 MG/3ML) 0.083% nebulizer solution Inhale 3 mLs (2.5 mg total) by nebulization every 6 (six) hours as needed for wheezing or shortness of breath. 03/16/23   Arloa Suzen RAMAN, NP  albuterol  (VENTOLIN  HFA) 108 (90 Base) MCG/ACT inhaler Inhale 2  puffs into the lungs every 4 (four) hours as needed for wheezing or shortness of breath. 03/16/23   Arloa Suzen RAMAN, NP  amLODipine  (NORVASC ) 10 MG tablet Take 1 tablet (10 mg total) by mouth daily. Due for follow up visit 08/05/21   Freddi Hamilton, MD  apixaban  (ELIQUIS ) 5 MG TABS tablet Take 1 tablet (5 mg total) by mouth 2 (two) times daily. 03/28/24   Magda Debby SAILOR, MD  aspirin  EC 81 MG tablet Take 1 tablet (81 mg total) by mouth daily at 6 (six) AM. Swallow whole. 11/20/23   Bethanie Cough, PA-C  atorvastatin  (LIPITOR) 20 MG tablet Take 20 mg by mouth at bedtime.    [provider]  Cholecalciferol  (VITAMIN D -1000 MAX ST) 25 MCG (1000 UT) tablet Take 1 tablet (1,000 Units total) by mouth daily. 08/13/23   Flint Sonny POUR, PA-C  clopidogrel  (PLAVIX ) 75 MG tablet Take 1 tablet (75 mg total) by mouth daily. 12/03/23   Baglia, Corrina, PA-C  fluticasone  furoate-vilanterol (BREO ELLIPTA ) 100-25 MCG/ACT AEPB Inhale 1 puff into the lungs daily. 10/01/22   Rudy Sieving, MD  gabapentin  (NEURONTIN ) 300 MG capsule Take 1 capsule (300 mg total) by mouth 3 (three) times daily as needed for up to 21 doses. 02/07/24   Cottie Cough PARAS, MD  ipratropium-albuterol  (DUONEB) 0.5-2.5 (3) MG/3ML SOLN Take 3 mLs by nebulization every 4 (four) hours as needed. 10/22/23   Joesph Shaver Scales, PA-C  levothyroxine  (SYNTHROID ) 75 MCG tablet Take 75 mcg by mouth every morning. 06/21/23   [provider]  methylPREDNISolone  (MEDROL )  4 MG tablet Take 4 mg by mouth See admin instructions. Take 6 tablets on day 1 Take 5 tablets on day 2 Take 4 tablets on day 3  Take 3 tablets on day 4 Take 2 tablets on day 5 Take 1 tablet on day 6 Patient not taking: Reported on 11/19/2023 10/22/23   [provider]  Multiple Vitamin (MULTI-VITAMIN) tablet Take 1 tablet by mouth daily. 05/08/16   [provider]  oxyCODONE  (OXY IR/ROXICODONE ) 5 MG immediate release tablet Take 1 tablet (5 mg total) by  mouth every 6 (six) hours as needed for moderate pain (pain score 4-6). Patient not taking: Reported on 12/28/2023 11/19/23   Bethanie Cough, PA-C  predniSONE  (DELTASONE ) 20 MG tablet Take 40 mg by mouth daily with breakfast. For 5 days. Patient not taking: Reported on 11/19/2023    [provider]  promethazine -dextromethorphan (PROMETHAZINE -DM) 6.25-15 MG/5ML syrup Take 5 mLs by mouth at bedtime as needed for cough.    [provider]  umeclidinium bromide  (INCRUSE ELLIPTA ) 62.5 MCG/ACT AEPB Inhale 1 puff into the lungs daily. 10/02/22   Guilloud, Carolyn, MD  valsartan (DIOVAN) 80 MG tablet Take 80 mg by mouth daily. 09/29/22   [provider]    Allergies: Epinephrine, Novocain [procaine], and Theraflu severe cold daytime [dm-phenylephrine -acetaminophen ]    Review of Systems  Gastrointestinal:  Positive for abdominal pain.  Musculoskeletal:  Positive for myalgias.  All other systems reviewed and are negative.   Updated Vital Signs BP (!) 151/80 (BP Location: Right Arm)   Pulse 68   Temp 98.1 F (36.7 C) (Oral)   Resp 17   Ht 5' 3.5 (1.613 m)   Wt 83.9 kg   SpO2 100%   BMI 32.26 kg/m   Physical Exam Vitals and nursing note reviewed.  Constitutional:      General: She is not in acute distress.    Appearance: Normal appearance. She is not ill-appearing or diaphoretic.  HENT:     Head: Normocephalic and atraumatic.     Mouth/Throat:     Mouth: Mucous membranes are moist.     Pharynx: Oropharynx is clear.  Eyes:     General:        Right eye: No discharge.        Left eye: No discharge.     Extraocular Movements: Extraocular movements intact.     Conjunctiva/sclera: Conjunctivae normal.  Cardiovascular:     Rate and Rhythm: Normal rate and regular rhythm.     Pulses: Normal pulses.     Heart sounds: Normal heart sounds. No murmur heard.    No friction rub. No gallop.  Pulmonary:     Effort: Pulmonary effort is normal. No respiratory distress.      Breath sounds: Normal breath sounds. No stridor. No wheezing, rhonchi or rales.  Chest:     Chest wall: No tenderness.  Abdominal:     General: Abdomen is flat. There is no distension.     Palpations: Abdomen is soft.     Tenderness: There is abdominal tenderness (Mild generalized tenderness to her abdomen). There is no guarding or rebound.  Musculoskeletal:        General: Tenderness (Tenderness noted to the bruised area on posterior calf) present. No swelling. Normal range of motion.     Cervical back: Normal range of motion. No rigidity.     Right lower leg: No edema.     Left lower leg: No edema.     Comments: DP pulse  confirmed by Doppler ultrasound.  Strong pulses noted.  Skin:    General: Skin is warm and dry.     Capillary Refill: Capillary refill takes less than 2 seconds.     Findings: Bruising (Bruising noted to the right lower extremity to the posterior calf measuring approximately 4.5 x 6.5 cm.) present. No erythema.  Neurological:     General: No focal deficit present.     Mental Status: She is alert and oriented to person, place, and time. Mental status is at baseline.     Sensory: No sensory deficit.     Motor: No weakness.     Gait: Gait normal.  Psychiatric:        Mood and Affect: Mood normal.     (all labs ordered are listed, but only abnormal results are displayed) Labs Reviewed  COMPREHENSIVE METABOLIC PANEL WITH GFR - Abnormal; Notable for the following components:      Result Value   Glucose, Bld 131 (*)    BUN 27 (*)    Creatinine, Ser 1.55 (*)    GFR, Estimated 36 (*)    All other components within normal limits  URINALYSIS, ROUTINE W REFLEX MICROSCOPIC - Abnormal; Notable for the following components:   APPearance HAZY (*)    Hgb urine dipstick LARGE (*)    Protein, ur 30 (*)    Leukocytes,Ua SMALL (*)    Bacteria, UA RARE (*)    All other components within normal limits  PROTIME-INR - Abnormal; Notable for the following components:    Prothrombin Time 15.9 (*)    All other components within normal limits  URINE CULTURE  LIPASE, BLOOD  CBC WITH DIFFERENTIAL/PLATELET  CBC WITH DIFFERENTIAL/PLATELET    EKG: None  Radiology: CT ABDOMEN PELVIS W CONTRAST Result Date: 04/12/2024 CLINICAL DATA:  Generalized abdominal pain. EXAM: CT ABDOMEN AND PELVIS WITH CONTRAST TECHNIQUE: Multidetector CT imaging of the abdomen and pelvis was performed using the standard protocol following bolus administration of intravenous contrast. RADIATION DOSE REDUCTION: This exam was performed according to the departmental dose-optimization program which includes automated exposure control, adjustment of the mA and/or kV according to patient size and/or use of iterative reconstruction technique. CONTRAST:  75mL OMNIPAQUE  IOHEXOL  350 MG/ML SOLN COMPARISON:  February 07, 2024 and January 18, 2021 FINDINGS: Lower chest: No acute abnormality. Hepatobiliary: No focal liver abnormality is seen. No gallstones, gallbladder wall thickening, or biliary dilatation. Pancreas: Unremarkable. No pancreatic ductal dilatation or surrounding inflammatory changes. Spleen: Normal in size without focal abnormality. Adrenals/Urinary Tract: A stable 1.4 cm low-attenuation (approximately 44.23 Hounsfield units) left adrenal mass is seen. A stable 0.9 cm low-attenuation (approximately 25.77 Hounsfield units) right adrenal mass is also noted. Kidneys are normal, without renal calculi, focal lesion, or hydronephrosis. The urinary bladder is poorly distended and subsequently limited in evaluation. Stomach/Bowel: There is a small hiatal hernia. Appendix appears normal. No evidence of bowel dilatation. Moderate to marked severity duodenal wall thickening is seen. This represents a new finding when compared to the prior study, given the similar degree of duodenal distension on both studies. Noninflamed diverticula are seen throughout the descending and sigmoid colon. Vascular/Lymphatic: Aortic  atherosclerosis. A left common iliac artery stent is seen. No enlarged abdominal or pelvic lymph nodes. Reproductive: Uterus and bilateral adnexa are unremarkable. Other: No abdominal wall hernia or abnormality. No abdominopelvic ascites. Musculoskeletal: Multilevel degenerative changes seen throughout the lumbar spine. This is most prominent at the level of L5-S1. IMPRESSION: 1. Moderate to marked severity duodenal wall thickening,  which may represent sequelae associated with acute duodenitis. 2. Colonic diverticulosis. 3. Stable bilateral adrenal masses, likely consistent with adrenal adenomas. 4. Small hiatal hernia. 5. Aortic atherosclerosis. Electronically Signed   By: Suzen Dials M.D.   On: 04/12/2024 23:07    Procedures   Medications Ordered in the ED  famotidine  (PEPCID ) IVPB 20 mg premix (20 mg Intravenous New Bag/Given 04/13/24 9372)  pantoprazole  (PROTONIX ) injection 40 mg (has no administration in time range)  iohexol  (OMNIPAQUE ) 350 MG/ML injection 75 mL (75 mLs Intravenous Contrast Given 04/12/24 2230)  alum & mag hydroxide-simeth (MAALOX/MYLANTA) 200-200-20 MG/5ML suspension 30 mL (30 mLs Oral Given 04/13/24 9378)                                Medical Decision Making Amount and/or Complexity of Data Reviewed Labs: ordered.   This patient is a 68 year old female who presents to the ED for concern of multiple complaints.  Bruising to her right lower extremity, abdominal pain and diarrhea.  On physical exam, patient is in no acute distress, afebrile, alert and orient x 4, speaking in full sentences, nontachypneic, nontachycardic.  Noted to have a mildly tender abdomen generically with no localization.  No CVA tenderness.  LCTAB, RRR, no murmur.  No lower leg edema noted.  Noted to have a 4.5 x 6.5 cm bruise to the posterior right lower calf.  It is tender to palpation.  Was visualized with ultrasound showing no clot.  Applied ice to the area.  Unremarkable exam otherwise.  Patient  provided with GI cocktail.  CT scan showed duodenitis which is likely because of her stomach discomfort today.  Will continue to have her take PPI and famotidine  and have her follow-up with GI as needed as well as PCP.  She does have follow-up with her vascular surgery team.  On reevaluation, patient is notably improved.  Will send her home with PPI famotidine  and have her follow-up with PCP and return to the ED for any new or worsening symptoms  Patient vital signs have remained stable throughout the course of patient's time in the ED. Low suspicion for any other emergent pathology at this time. I believe this patient is safe to be discharged. Provided strict return to ER precautions. Patient expressed agreement and understanding of plan. All questions were answered.  Differential diagnoses prior to evaluation: The emergent differential diagnosis includes, but is not limited to, AAA, Mesenteric Ischemia, Bowel Obstruction, Perforated Viscus, DKA, Sepsis , Pancreatitis, Appendicitis, Gastroenteritis, Constipation, Urinary Tract Infection, Medication Side Effect, Electrolyte Imbalance, Gastroparesis, Functional Abdominal Pain, Ischemic Colitis, limb ischemia, DVT, cellulitis, compartment syndrome. This is not an exhaustive differential.   Past Medical History / Co-morbidities / Social History: Prediabetes, COPD, alcohol abuse, osteoarthritis, throat cancer, HLD, HTN, anticoagulated after limb ischemia and stent placement of left lower extremity  Additional history: Chart reviewed. Pertinent results include:   Seen on 2//04/2024 for acute on ischemia with stent placed in left lower extremity.  Noted also been seen on 4/20/125 for acute midline back pain without sciatica.  Notably complaining of leg pain and bruising at that time as well.  With no large ecchymosis noted.  Having a DVT ultrasound on the time which was negative.  Lab Tests/Imaging studies: I personally interpreted labs/imaging and  the pertinent results include:   CBC unremarkable CMP shows elevated creatinine 1.55 elevated BUN 27 and decreased GFR of 36, likely secondary to dehydration as patient had  notably not drink much within the last 24 hours.  Patient is continue to tolerate p.o.  UA shows protein, leukocytosis and rare bacteria with hazy appearance.  UA culture pending. Lipase unremarkable PT is slightly elevated with 15.9 INR normal  CT abdomen pelvis shows moderate severe duodenal wall thickening which may represent sequela associate with acute duodenitis.  Noted also did show colonic diverticulosis without diverticulitis as well as stable bilateral adrenal masses, small hiatal hernia and aortic atherosclerosis.  I agree with the radiologist interpretation.    Medications: I ordered medication including GI cocktail, famotidine .  I have reviewed the patients home medicines and have made adjustments as needed.  Critical Interventions: None  Social Determinants of Health: Notably has good follow-up with PCP as well as with vascular surgery team  Disposition: After consideration of the diagnostic results and the patients response to treatment, I feel that the patient would benefit from discharge and treatment as above.   emergency department workup does not suggest an emergent condition requiring admission or immediate intervention beyond what has been performed at this time. The plan is: Follow-up with PCP for ensure no treatment for the duodenitis, return to the ED for any new or worsening symptoms, Pepcid  and famotidine  for duodenitis, follow-up with surgical team as needed, continue use ice over bruising. The patient is safe for discharge and has been instructed to return immediately for worsening symptoms, change in symptoms or any other concerns.     Final diagnoses:  Duodenitis  Bruising    ED Discharge Orders          Ordered    famotidine  (PEPCID ) 20 MG tablet  Daily        04/13/24 0626     pantoprazole  (PROTONIX ) 20 MG tablet  Daily        04/13/24 0626               Beola Terrall RAMAN, PA-C 04/13/24 0636    Albertina Dixon, MD 04/13/24 [phone]

## 2024-04-14 LAB — URINE CULTURE: Culture: NO GROWTH

## 2024-04-20 ENCOUNTER — Encounter (HOSPITAL_COMMUNITY): Payer: Self-pay

## 2024-04-20 ENCOUNTER — Ambulatory Visit (HOSPITAL_COMMUNITY)
Admission: EM | Admit: 2024-04-20 | Discharge: 2024-04-20 | Disposition: A | Discharge status: Home / Self Care | Attending: Physician Assistant | Admitting: Physician Assistant

## 2024-04-20 DIAGNOSIS — L089 Local infection of the skin and subcutaneous tissue, unspecified: Secondary | ICD-10-CM | POA: Diagnosis not present

## 2024-04-20 DIAGNOSIS — S61452A Open bite of left hand, initial encounter: Secondary | ICD-10-CM | POA: Diagnosis not present

## 2024-04-20 LAB — POCT FASTING CBG KUC MANUAL ENTRY: POCT Glucose (KUC): 191 mg/dL — AB (ref 70–99)

## 2024-04-20 MED ORDER — CEFTRIAXONE SODIUM 1 G IJ SOLR
INTRAMUSCULAR | Status: AC
Start: 2024-04-20 — End: 2024-04-20
  Filled 2024-04-20: qty 10

## 2024-04-20 MED ORDER — DOXYCYCLINE HYCLATE 100 MG PO CAPS: 100.0000 mg | ORAL_CAPSULE | Freq: Two times a day (BID) | ORAL | 0 refills | Status: DC

## 2024-04-20 MED ORDER — LIDOCAINE HCL (PF) 1 % IJ SOLN
INTRAMUSCULAR | Status: AC
  Filled 2024-04-20: qty 2

## 2024-04-20 MED ORDER — CEFTRIAXONE SODIUM 1 G IJ SOLR
1.0000 g | INTRAMUSCULAR | Status: DC
  Administered 2024-04-20: 1 g via INTRAMUSCULAR

## 2024-04-20 NOTE — ED Provider Notes (Signed)
 [facility]    CSN: [id number] Arrival date & time: 04/20/24  0815      History   Chief Complaint Chief Complaint  Patient presents with   Insect Bite    HPI Zoe Snyder is a 68 y.o. female.   Pt complains of redness to left hand.  Pt reports she was bitten by something 2 days ago.  Pt reports hand began swelling.  Pt is on a blood thinner.  Pt reports she bruises easily.  Pt reports the area started out as a small pimple but then began looking bruised in the center.  Patient now reports swelling around the area and the top of her hand.  Patient denies having any fever or chills.  She reports the area is painful.  Patient reports that she is borderline diabetic she is not currently on any medications for diabetes  The history is provided by the patient. No language interpreter was used.    Past Medical History:  Diagnosis Date   COPD (chronic obstructive pulmonary disease) (HCC)    Hashimoto's thyroiditis    History of alcohol abuse 11/30/2017   History of cocaine abuse (HCC) 11/30/2017   History of colon polyps    Hyperlipidemia    Hypertension    Prediabetes    Throat cancer (HCC)    Tobacco use disorder     Patient Active Problem List   Diagnosis Date Noted   Critical lower limb ischemia (HCC) 11/18/2023   Acute respiratory failure with hypoxia (HCC) 09/29/2022   Influenza due to influenza A virus 09/29/2022   COPD with acute exacerbation (HCC) 12/08/2017   Heavy tobacco smoker >10 cigarettes per day 12/08/2017   Unintended weight loss 12/08/2017   Mixed hyperlipidemia 12/08/2017   Hypertension goal BP (blood pressure) < 130/80 12/08/2017   Prediabetes 12/08/2017   Throat cancer (HCC) 12/08/2017   History of cocaine abuse (HCC) 11/30/2017   History of alcohol abuse 11/30/2017   Refused influenza vaccine 11/29/2017   History of colon polyps    Olecranon bursitis of right elbow 02/03/2017   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   Subclinical  hypothyroidism 05/06/2016   Muscle cramps 04/29/2016   Malignant neoplasm of larynx (HCC) 01/09/2016   Dysphonia 03/21/2015   Displaced comminuted fracture of shaft of right fibula, sequela 03/07/2015   Anxiety disorder 01/16/2014   Obesity 10/19/2012   Dry eye syndrome 06/10/2009   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   Vitamin D  deficiency 05/28/2009   Sleep apnea 05/07/2008   History of hepatitis C 01/02/2003   Dyspnea 01/02/2003    Past Surgical History:  Procedure Laterality Date   ANGIOPLASTY  11/18/2023   Procedure: LEFT COMMON ILIAC ANGIOPLASTY USING VIABAHN 7 MM X AND 7 MM X 59 MM STENTS;  Surgeon: Magda Debby SAILOR, MD;  Location: [facility] OR;  Service: Vascular;;   AORTOGRAM  11/18/2023   Procedure: AORTOGRAM;  Surgeon: Magda Debby SAILOR, MD;  Location: [facility] OR;  Service: Vascular;;   COLONOSCOPY W/ POLYPECTOMY  2016   GROIN EXPOSURE Left 11/18/2023   Procedure: LEFT GROIN AND FEMORAL ARTERY EXPOSURE;  Surgeon: Magda Debby SAILOR, MD;  Location: [facility] OR;  Service: Vascular;  Laterality: Left;   THROAT SURGERY  2018   cancer   ULTRASOUND GUIDANCE FOR VASCULAR ACCESS Right 11/18/2023   Procedure: ULTRASOUND GUIDANCE FOR VASCULAR ACCESS;  Surgeon: Magda Debby SAILOR, MD;  Location: [facility] OR;  Service: Vascular;  Laterality: Right;    OB History   No obstetric history on  file.      Home Medications    Prior to Admission medications   Medication Sig Start Date End Date Taking? Authorizing Provider  albuterol  (PROVENTIL ) (2.5 MG/3ML) 0.083% nebulizer solution Inhale 3 mLs (2.5 mg total) by nebulization every 6 (six) hours as needed for wheezing or shortness of breath. 03/16/23   Arloa Suzen RAMAN, NP  albuterol  (VENTOLIN  HFA) 108 (90 Base) MCG/ACT inhaler Inhale 2 puffs into the lungs every 4 (four) hours as needed for wheezing or shortness of breath. 03/16/23   Arloa Suzen RAMAN, NP  amLODipine  (NORVASC ) 10 MG tablet Take 1 tablet (10 mg total) by mouth daily. Due for follow up visit 08/05/21   Freddi Hamilton, MD  apixaban  (ELIQUIS ) 5 MG TABS tablet Take 1 tablet (5 mg total) by mouth 2 (two) times daily. 03/28/24   Magda Debby SAILOR, MD  aspirin  EC 81 MG tablet Take 1 tablet (81 mg total) by mouth daily at 6 (six) AM. Swallow whole. 11/20/23   Bethanie Cough, PA-C  atorvastatin  (LIPITOR) 20 MG tablet Take 20 mg by mouth at bedtime.    [provider]  Cholecalciferol  (VITAMIN D -1000 MAX ST) 25 MCG (1000 UT) tablet Take 1 tablet (1,000 Units total) by mouth daily. 08/13/23   Flint Sonny POUR, PA-C  clopidogrel  (PLAVIX ) 75 MG tablet Take 1 tablet (75 mg total) by mouth daily. 12/03/23   Baglia, Corrina, PA-C  famotidine  (PEPCID ) 20 MG tablet Take 1 tablet (20 mg total) by mouth daily. 04/13/24   Bauer, Collin S, PA-C  fluticasone  furoate-vilanterol (BREO ELLIPTA ) 100-25 MCG/ACT AEPB Inhale 1 puff into the lungs daily. 10/01/22   Rudy Sieving, MD  gabapentin  (NEURONTIN ) 300 MG capsule Take 1 capsule (300 mg total) by mouth 3 (three) times daily as needed for up to 21 doses. 02/07/24   Cottie Cough PARAS, MD  ipratropium-albuterol  (DUONEB) 0.5-2.5 (3) MG/3ML SOLN Take 3 mLs by nebulization every 4 (four) hours as needed. 10/22/23   Joesph Shaver Scales, PA-C  levothyroxine  (SYNTHROID ) 75 MCG tablet Take 75 mcg by mouth every morning. 06/21/23   [provider]  methylPREDNISolone  (MEDROL ) 4 MG tablet Take 4 mg by mouth See admin instructions. Take 6 tablets on day 1 Take 5 tablets on day 2 Take 4 tablets on day 3  Take 3 tablets on day 4 Take 2 tablets on day 5 Take 1 tablet on day 6 Patient not taking: Reported on 11/19/2023 10/22/23   [provider]  Multiple Vitamin (MULTI-VITAMIN) tablet Take 1 tablet by mouth daily. 05/08/16   [provider]  oxyCODONE  (OXY IR/ROXICODONE ) 5 MG immediate release tablet Take 1 tablet (5 mg total) by mouth every 6 (six) hours as needed for moderate pain (pain score 4-6). Patient not taking: Reported on 12/28/2023 11/19/23   Bethanie Cough, PA-C  pantoprazole  (PROTONIX ) 20 MG tablet Take 1 tablet (20 mg total) by mouth daily. 04/13/24   Bauer, Collin S, PA-C  predniSONE  (DELTASONE ) 20 MG tablet Take 40 mg by mouth daily with breakfast. For 5 days. Patient not taking: Reported on 11/19/2023    [provider]  promethazine -dextromethorphan (PROMETHAZINE -DM) 6.25-15 MG/5ML syrup Take 5 mLs by mouth at bedtime as needed for cough.    [provider]  umeclidinium bromide  (INCRUSE ELLIPTA ) 62.5 MCG/ACT AEPB Inhale 1 puff into the lungs daily. 10/02/22   Guilloud, Carolyn, MD  valsartan (DIOVAN) 80 MG tablet Take 80 mg by mouth daily. 09/29/22   [provider]    Family  History Family History  Problem Relation Age of Onset   Hypertension Mother    Diabetes Maternal Grandmother    Colon cancer Maternal Aunt     Social History Social History   Tobacco Use   Smoking status: Every Day    Current packs/day: 0.50    Average packs/day: 0.5 packs/day for 45.0 years (22.5 ttl pk-yrs)    Types: Cigarettes   Smokeless tobacco: Never   Tobacco comments:    currently smoking 0.5 ppd, but she has cut down from over 1 ppd  Vaping Use   Vaping status: Never Used  Substance Use Topics   Alcohol use: Yes   Drug use: Not Currently    Types: Marijuana     Allergies   Epinephrine, Novocain [procaine], and Theraflu severe cold daytime [dm-phenylephrine -acetaminophen ]   Review of Systems Review of Systems  All other systems reviewed and are negative.    Physical Exam Triage Vital Signs ED Triage Vitals [04/20/24 0836]  Encounter Vitals Group     BP 125/71     Girls Systolic BP Percentile      Girls Diastolic BP Percentile      Boys Systolic BP Percentile      Boys Diastolic BP Percentile      Pulse Rate 91     Resp 16     Temp 99.9 F (37.7 C)     Temp Source Oral     SpO2 95 %     Weight      Height      Head Circumference      Peak Flow      Pain Score 10     Pain Loc      Pain  Education      Exclude from Growth Chart    No data found.  Updated Vital Signs BP 125/71 (BP Location: Right Arm)   Pulse 91   Temp 99.9 F (37.7 C) (Oral)   Resp 16   SpO2 95%   Visual Acuity Right Eye Distance:   Left Eye Distance:   Bilateral Distance:    Right Eye Near:   Left Eye Near:    Bilateral Near:     Physical Exam Vitals reviewed.  Constitutional:      Appearance: Normal appearance.  Cardiovascular:     Rate and Rhythm: Normal rate.  Pulmonary:     Effort: Pulmonary effort is normal.  Musculoskeletal:        General: Swelling and tenderness present.     Comments: Red swollen dorsal left hand, full range of motion, pain with range of motion neurovascular neurosensory intact 2 cm dark area mid hand, appears to be hematoma redness around area  Skin:    General: Skin is warm.  Neurological:     General: No focal deficit present.     Mental Status: She is alert.         UC Treatments / Results  Labs (all labs ordered are listed, but only abnormal results are displayed) Labs Reviewed  CBG MONITORING, ED    EKG   Radiology No results found.  Procedures Procedures (including critical care time)  Medications Ordered in UC Medications - No data to display  Initial Impression / Assessment and Plan / UC Course  I have reviewed the triage vital signs and the nursing notes.  Pertinent labs & imaging results that were available during my care of the patient were reviewed by me and considered in my medical decision making (see chart  for details).     Patient is unsure what might have bitten her.  Patient was in the house.  She thinks it was an insect.  Patient reports she bruises and swells easily because of the Eliquis  however today she noticed the redness on the top of her hand.  Patient advised that area appears to be becoming infected.  Patient is adamant that she does not want to go to the emergency department.  Patient given injection of  Rocephin .  Prescription for doxycycline .  She is advised to return here tomorrow for 24-hour recheck.  She understands that if she is not improving she may require hospitalization for IV antibiotics. Final Clinical Impressions(s) / UC Diagnoses   Final diagnoses:  Bite wound of left hand with infection, initial encounter     Discharge Instructions      Return here tomorrow for recheck.  If area is worsening you may need to be seen in the Emergency department for IV antibiotics and possible admission      ED Prescriptions     Medication Sig Dispense Auth. Provider   doxycycline  (VIBRAMYCIN ) 100 MG capsule Take 1 capsule (100 mg total) by mouth 2 (two) times daily. 20 capsule Lynann Demetrius K, PA-C      PDMP not reviewed this encounter. An After Visit Summary was printed and given to the patient.    Flint Sonny POUR, PA-C 04/20/24 [phone]

## 2024-04-20 NOTE — Discharge Instructions (Addendum)
 Return here tomorrow for recheck.  If area is worsening you may need to be seen in the Emergency department for IV antibiotics and possible admission

## 2024-04-20 NOTE — ED Triage Notes (Signed)
 Patient reports that she was bitten by something to the left hand 2 days ago. Patient has redness, pain, and swelling into the left forearm.  Patient has not had any medications for her symptoms.

## 2024-04-21 ENCOUNTER — Inpatient Hospital Stay (HOSPITAL_COMMUNITY)

## 2024-04-21 ENCOUNTER — Encounter (HOSPITAL_COMMUNITY): Payer: Self-pay

## 2024-04-21 ENCOUNTER — Emergency Department (HOSPITAL_COMMUNITY)

## 2024-04-21 ENCOUNTER — Other Ambulatory Visit: Payer: Self-pay

## 2024-04-21 ENCOUNTER — Ambulatory Visit (HOSPITAL_COMMUNITY): Admission: EM | Admit: 2024-04-21 | Discharge: 2024-04-21 | Disposition: A | Discharge status: Home / Self Care

## 2024-04-21 ENCOUNTER — Inpatient Hospital Stay (HOSPITAL_COMMUNITY)
Admission: EM | Admit: 2024-04-21 | Discharge: 2024-04-24 | DRG: 513 | Disposition: A | Discharge status: Home / Self Care | Source: Ambulatory Visit | Attending: Internal Medicine | Admitting: Internal Medicine

## 2024-04-21 DIAGNOSIS — Z833 Family history of diabetes mellitus: Secondary | ICD-10-CM

## 2024-04-21 DIAGNOSIS — J449 Chronic obstructive pulmonary disease, unspecified: Secondary | ICD-10-CM | POA: Diagnosis present

## 2024-04-21 DIAGNOSIS — I70229 Atherosclerosis of native arteries of extremities with rest pain, unspecified extremity: Secondary | ICD-10-CM | POA: Diagnosis present

## 2024-04-21 DIAGNOSIS — Z86718 Personal history of other venous thrombosis and embolism: Secondary | ICD-10-CM | POA: Diagnosis not present

## 2024-04-21 DIAGNOSIS — Z884 Allergy status to anesthetic agent status: Secondary | ICD-10-CM | POA: Diagnosis not present

## 2024-04-21 DIAGNOSIS — Z95828 Presence of other vascular implants and grafts: Secondary | ICD-10-CM | POA: Diagnosis not present

## 2024-04-21 DIAGNOSIS — N179 Acute kidney failure, unspecified: Secondary | ICD-10-CM | POA: Diagnosis present

## 2024-04-21 DIAGNOSIS — L0390 Cellulitis, unspecified: Secondary | ICD-10-CM | POA: Diagnosis present

## 2024-04-21 DIAGNOSIS — Z888 Allergy status to other drugs, medicaments and biological substances status: Secondary | ICD-10-CM

## 2024-04-21 DIAGNOSIS — E785 Hyperlipidemia, unspecified: Secondary | ICD-10-CM | POA: Diagnosis present

## 2024-04-21 DIAGNOSIS — Z7901 Long term (current) use of anticoagulants: Secondary | ICD-10-CM

## 2024-04-21 DIAGNOSIS — Z79899 Other long term (current) drug therapy: Secondary | ICD-10-CM | POA: Diagnosis not present

## 2024-04-21 DIAGNOSIS — L03114 Cellulitis of left upper limb: Secondary | ICD-10-CM | POA: Diagnosis present

## 2024-04-21 DIAGNOSIS — F17210 Nicotine dependence, cigarettes, uncomplicated: Secondary | ICD-10-CM | POA: Diagnosis present

## 2024-04-21 DIAGNOSIS — E063 Autoimmune thyroiditis: Secondary | ICD-10-CM | POA: Diagnosis present

## 2024-04-21 DIAGNOSIS — Z7982 Long term (current) use of aspirin: Secondary | ICD-10-CM

## 2024-04-21 DIAGNOSIS — I739 Peripheral vascular disease, unspecified: Secondary | ICD-10-CM | POA: Diagnosis present

## 2024-04-21 DIAGNOSIS — I10 Essential (primary) hypertension: Secondary | ICD-10-CM | POA: Diagnosis present

## 2024-04-21 DIAGNOSIS — Z8521 Personal history of malignant neoplasm of larynx: Secondary | ICD-10-CM

## 2024-04-21 DIAGNOSIS — Z800 Family history of malignant neoplasm of digestive organs: Secondary | ICD-10-CM

## 2024-04-21 DIAGNOSIS — L03012 Cellulitis of left finger: Principal | ICD-10-CM

## 2024-04-21 DIAGNOSIS — Z7951 Long term (current) use of inhaled steroids: Secondary | ICD-10-CM

## 2024-04-21 DIAGNOSIS — Z7902 Long term (current) use of antithrombotics/antiplatelets: Secondary | ICD-10-CM

## 2024-04-21 DIAGNOSIS — Z860100 Personal history of colon polyps, unspecified: Secondary | ICD-10-CM

## 2024-04-21 DIAGNOSIS — L02512 Cutaneous abscess of left hand: Secondary | ICD-10-CM | POA: Diagnosis present

## 2024-04-21 DIAGNOSIS — Z8249 Family history of ischemic heart disease and other diseases of the circulatory system: Secondary | ICD-10-CM | POA: Diagnosis not present

## 2024-04-21 DIAGNOSIS — M65142 Other infective (teno)synovitis, left hand: Secondary | ICD-10-CM | POA: Diagnosis present

## 2024-04-21 DIAGNOSIS — E039 Hypothyroidism, unspecified: Secondary | ICD-10-CM | POA: Diagnosis not present

## 2024-04-21 DIAGNOSIS — Z79890 Hormone replacement therapy (postmenopausal): Secondary | ICD-10-CM

## 2024-04-21 DIAGNOSIS — D6859 Other primary thrombophilia: Secondary | ICD-10-CM | POA: Diagnosis present

## 2024-04-21 DIAGNOSIS — N189 Chronic kidney disease, unspecified: Secondary | ICD-10-CM | POA: Diagnosis not present

## 2024-04-21 DIAGNOSIS — R7303 Prediabetes: Secondary | ICD-10-CM | POA: Diagnosis present

## 2024-04-21 LAB — URINALYSIS, W/ REFLEX TO CULTURE (INFECTION SUSPECTED)
Bilirubin Urine: NEGATIVE
Glucose, UA: 50 mg/dL — AB
Ketones, ur: NEGATIVE mg/dL
Nitrite: NEGATIVE
Protein, ur: 30 mg/dL — AB
RBC / HPF: >50 RBC/hpf (ref 0–5)
Specific Gravity, Urine: 1.015 (ref 1.005–1.030)
pH: 5 (ref 5.0–8.0)

## 2024-04-21 LAB — COMPREHENSIVE METABOLIC PANEL WITH GFR
ALT: 13 U/L (ref 0–44)
AST: 11 U/L — ABNORMAL LOW (ref 15–41)
Albumin: 3.4 g/dL — ABNORMAL LOW (ref 3.5–5.0)
Alkaline Phosphatase: 70 U/L (ref 38–126)
Anion gap: 12 (ref 5–15)
BUN: 22 mg/dL (ref 8–23)
CO2: 20 mmol/L — ABNORMAL LOW (ref 22–32)
Calcium: 9.4 mg/dL (ref 8.9–10.3)
Chloride: 106 mmol/L (ref 98–111)
Creatinine, Ser: 1.53 mg/dL — ABNORMAL HIGH (ref 0.44–1.00)
GFR, Estimated: 37 mL/min — ABNORMAL LOW (ref >60)
Glucose, Bld: 182 mg/dL — ABNORMAL HIGH (ref 70–99)
Potassium: 3.9 mmol/L (ref 3.5–5.1)
Sodium: 138 mmol/L (ref 135–145)
Total Bilirubin: 0.7 mg/dL (ref 0.0–1.2)
Total Protein: 6.9 g/dL (ref 6.5–8.1)

## 2024-04-21 LAB — CBC WITH DIFFERENTIAL/PLATELET
Abs Immature Granulocytes: 0.06 K/uL (ref 0.00–0.07)
Basophils Absolute: 0.1 K/uL (ref 0.0–0.1)
Basophils Relative: 1 %
Eosinophils Absolute: 0.1 K/uL (ref 0.0–0.5)
Eosinophils Relative: 1 %
HCT: 38.1 % (ref 36.0–46.0)
Hemoglobin: 12.5 g/dL (ref 12.0–15.0)
Immature Granulocytes: 1 %
Lymphocytes Relative: 13 %
Lymphs Abs: 1.8 K/uL (ref 0.7–4.0)
MCH: 30.2 pg (ref 26.0–34.0)
MCHC: 32.8 g/dL (ref 30.0–36.0)
MCV: 92 fL (ref 80.0–100.0)
Monocytes Absolute: 1 K/uL (ref 0.1–1.0)
Monocytes Relative: 8 %
Neutro Abs: 10.2 K/uL — ABNORMAL HIGH (ref 1.7–7.7)
Neutrophils Relative %: 76 %
Platelets: 239 K/uL (ref 150–400)
RBC: 4.14 MIL/uL (ref 3.87–5.11)
RDW: 13.4 % (ref 11.5–15.5)
WBC: 13.2 K/uL — ABNORMAL HIGH (ref 4.0–10.5)
nRBC: 0 % (ref 0.0–0.2)

## 2024-04-21 LAB — HIV ANTIBODY (ROUTINE TESTING W REFLEX): HIV Screen 4th Generation wRfx: NONREACTIVE

## 2024-04-21 LAB — I-STAT CG4 LACTIC ACID, ED
Lactic Acid, Venous: 1.1 mmol/L (ref 0.5–1.9)
Lactic Acid, Venous: 1 mmol/L (ref 0.5–1.9)

## 2024-04-21 MED ORDER — ENOXAPARIN SODIUM 40 MG/0.4ML IJ SOSY: 40.0000 mg | PREFILLED_SYRINGE | INTRAMUSCULAR | Status: DC

## 2024-04-21 MED ORDER — HYDROMORPHONE HCL 2 MG PO TABS
1.0000 mg | ORAL_TABLET | Freq: Four times a day (QID) | ORAL | Status: DC | PRN
  Administered 2024-04-22 – 2024-04-23 (×4): 1 mg via ORAL
  Filled 2024-04-21 (×4): qty 1

## 2024-04-21 MED ORDER — VANCOMYCIN HCL 2000 MG/400ML IV SOLN
2000.0000 mg | Freq: Once | INTRAVENOUS | Status: AC
  Administered 2024-04-21: 2000 mg via INTRAVENOUS
  Filled 2024-04-21: qty 400

## 2024-04-21 MED ORDER — GADOBUTROL 1 MMOL/ML IV SOLN
8.0000 mL | Freq: Once | INTRAVENOUS | Status: AC | PRN
  Administered 2024-04-21: 8 mL via INTRAVENOUS

## 2024-04-21 MED ORDER — PANTOPRAZOLE SODIUM 20 MG PO TBEC
20.0000 mg | DELAYED_RELEASE_TABLET | Freq: Every day | ORAL | Status: DC
  Administered 2024-04-23 – 2024-04-24 (×2): 20 mg via ORAL
  Filled 2024-04-21 (×2): qty 1

## 2024-04-21 MED ORDER — LEVOTHYROXINE SODIUM 75 MCG PO TABS
75.0000 ug | ORAL_TABLET | ORAL | Status: DC
  Administered 2024-04-22 – 2024-04-24 (×3): 75 ug via ORAL
  Filled 2024-04-21 (×3): qty 1

## 2024-04-21 MED ORDER — ACETAMINOPHEN 500 MG PO TABS
1000.0000 mg | ORAL_TABLET | Freq: Four times a day (QID) | ORAL | Status: DC
  Administered 2024-04-21 – 2024-04-24 (×8): 1000 mg via ORAL
  Filled 2024-04-21 (×9): qty 2

## 2024-04-21 MED ORDER — MUPIROCIN 2 % EX OINT
1.0000 | TOPICAL_OINTMENT | Freq: Two times a day (BID) | CUTANEOUS | Status: DC
Start: 2024-04-21 — End: 2024-04-26
  Administered 2024-04-21 – 2024-04-24 (×6): 1 via NASAL
  Filled 2024-04-21 (×3): qty 22

## 2024-04-21 MED ORDER — VANCOMYCIN HCL IN DEXTROSE 1-5 GM/200ML-% IV SOLN: 1000.0000 mg | Freq: Once | INTRAVENOUS | Status: DC

## 2024-04-21 MED ORDER — VANCOMYCIN HCL 1750 MG/350ML IV SOLN: 1750.0000 mg | INTRAVENOUS | Status: DC

## 2024-04-21 MED ORDER — UMECLIDINIUM BROMIDE 62.5 MCG/ACT IN AEPB
1.0000 | INHALATION_SPRAY | Freq: Every day | RESPIRATORY_TRACT | Status: DC
  Administered 2024-04-23 – 2024-04-24 (×2): 1 via RESPIRATORY_TRACT
  Filled 2024-04-21: qty 7

## 2024-04-21 MED ORDER — ATORVASTATIN CALCIUM 10 MG PO TABS
20.0000 mg | ORAL_TABLET | Freq: Every day | ORAL | Status: DC
  Administered 2024-04-21 – 2024-04-23 (×3): 20 mg via ORAL
  Filled 2024-04-21 (×3): qty 2

## 2024-04-21 NOTE — H&P (View-Only) (Signed)
 HAND SURGERY CONSULTATION  REQUESTING PHYSICIAN: Francesco Elsie NOVAK, MD   Chief Complaint: Left hand pain  HPI: Zoe Snyder is a 68 y.o. female who presents with an infection involving the dorsum of her left hand.  This started 3 days ago with a small blister at the dorsum of her hand.  She is  unsure if there was a bug bite or other superficial wound.  She was seen by an urgent care and was discharged on oral doxycycline .  She returned to the emergency room today with worsening pain, swelling, and erythema.  She describes pain at the dorsal aspect of her hand that is worse with attempted range of motion of her wrist and fingers.  She denies any pain in the forearm or elbow.  She denies any numbness or paresthesias in her fingers.  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  She is currently on Eliquis  and Plavix .  She is a chronic tobacco user.   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  Past Medical History:  Diagnosis Date   COPD (chronic obstructive pulmonary disease) (HCC)    Hashimoto's thyroiditis    History of alcohol abuse 11/30/2017   History of cocaine abuse (HCC) 11/30/2017   History of colon polyps    Hyperlipidemia    Hypertension    Prediabetes    Throat cancer (HCC)    Tobacco use disorder    Past Surgical History:  Procedure Laterality Date   ANGIOPLASTY  11/18/2023   Procedure: LEFT COMMON ILIAC ANGIOPLASTY USING VIABAHN 7 MM X AND 7 MM X 59 MM STENTS;  Surgeon: Magda Debby SAILOR, MD;  Location: [facility] OR;  Service: Vascular;;   AORTOGRAM  11/18/2023   Procedure: AORTOGRAM;  Surgeon: Magda Debby SAILOR, MD;  Location: [facility] OR;  Service: Vascular;;   COLONOSCOPY W/ POLYPECTOMY  2016   GROIN EXPOSURE Left 11/18/2023   Procedure: LEFT GROIN AND FEMORAL ARTERY EXPOSURE;  Surgeon: Magda Debby SAILOR, MD;  Location: [facility] OR;  Service: Vascular;  Laterality: Left;   THROAT SURGERY  2018   cancer    ULTRASOUND GUIDANCE FOR VASCULAR ACCESS Right 11/18/2023   Procedure: ULTRASOUND GUIDANCE FOR VASCULAR ACCESS;  Surgeon: Magda Debby SAILOR, MD;  Location: [facility] OR;  Service: Vascular;  Laterality: Right;   Social History   Socioeconomic History   Marital status: Single    Spouse name: Not on file   Number of children: Not on file   Years of education: Not on file   Highest education level: Not on file  Occupational History   Not on file  Tobacco Use   Smoking status: Every Day    Current packs/day: 0.50    Average packs/day: 0.5 packs/day for 45.0 years (22.5 ttl pk-yrs)    Types: Cigarettes   Smokeless tobacco: Never   Tobacco comments:    currently smoking 0.5 ppd, but she has cut down from over 1 ppd  Vaping Use   Vaping status: Never Used  Substance and Sexual Activity   Alcohol use: Yes   Drug use: Not Currently    Types: Marijuana   Sexual activity: Not Currently  Other Topics Concern   Not on file  Social History Narrative   Not on file   Social Drivers of Health   Financial Resource Strain: Not on File (06/05/2018)   Received from [employer]    Financial Resource Strain:  0  Food Insecurity: No Food Insecurity (04/21/2024)   Hunger Vital Sign    Worried About Running Out of Food in the Last Year: Never true    Ran Out of Food in the Last Year: Never true  Transportation Needs: No Transportation Needs (04/21/2024)   PRAPARE - Administrator, Civil Service (Medical): No    Lack of Transportation (Non-Medical): No  Physical Activity: Not on File (06/05/2018)   Received from [facility]   Physical Activity    Physical Activity: 0  Stress: Not on File (06/05/2018)   Received from [facility]   Stress    Stress: 0  Social Connections: Unknown (04/21/2024)   Social Connection and Isolation Panel    Frequency of Communication with Friends and Family: Never    Frequency of Social Gatherings with Friends and Family: Never    Attends Religious  Services: Never    Database administrator or Organizations: Yes    Attends Engineer, structural: More than 4 times per year    Marital Status: Patient declined   Family History  Problem Relation Age of Onset   Hypertension Mother    Diabetes Maternal Grandmother    Colon cancer Maternal Aunt    - negative except otherwise stated in the family history section Allergies  Allergen Reactions   Epinephrine Anaphylaxis    Respiratory problems, e.g., wheezing;  Palpitations   Palpitations  Respiratory problems, e.g., wheezing;, Palpitations   Novocain [Procaine] Palpitations and Other (See Comments)    Heart racing   Theraflu Severe Cold Daytime [Dm-Phenylephrine -Acetaminophen ] Palpitations   Prior to Admission medications   Medication Sig Start Date End Date Taking? Authorizing Provider  albuterol  (PROVENTIL ) (2.5 MG/3ML) 0.083% nebulizer solution Inhale 3 mLs (2.5 mg total) by nebulization every 6 (six) hours as needed for wheezing or shortness of breath. 03/16/23  Yes Arloa Suzen RAMAN, NP  albuterol  (VENTOLIN  HFA) 108 (90 Base) MCG/ACT inhaler Inhale 2 puffs into the lungs every 4 (four) hours as needed for wheezing or shortness of breath. 03/16/23  Yes Arloa Suzen RAMAN, NP  amLODipine  (NORVASC ) 10 MG tablet Take 1 tablet (10 mg total) by mouth daily. Due for follow up visit 08/05/21  Yes Freddi Hamilton, MD  apixaban  (ELIQUIS ) 5 MG TABS tablet Take 1 tablet (5 mg total) by mouth 2 (two) times daily. 03/28/24  Yes Magda Debby SAILOR, MD  aspirin  EC 81 MG tablet Take 1 tablet (81 mg total) by mouth daily at 6 (six) AM. Swallow whole. 11/20/23  Yes Bethanie Cough, PA-C  atorvastatin  (LIPITOR) 20 MG tablet Take 20 mg by mouth at bedtime.   Yes [provider]  clopidogrel  (PLAVIX ) 75 MG tablet Take 1 tablet (75 mg total) by mouth daily. 12/03/23  Yes Baglia, Corrina, PA-C  doxycycline  (VIBRAMYCIN ) 100 MG capsule Take 1 capsule (100 mg total) by mouth 2 (two) times daily.  04/20/24  Yes Flint Raring K, PA-C  famotidine  (PEPCID ) 20 MG tablet Take 1 tablet (20 mg total) by mouth daily. 04/13/24  Yes Bauer, Collin S, PA-C  ipratropium-albuterol  (DUONEB) 0.5-2.5 (3) MG/3ML SOLN Take 3 mLs by nebulization every 4 (four) hours as needed. 10/22/23  Yes Joesph Shaver Scales, PA-C  levothyroxine  (SYNTHROID ) 75 MCG tablet Take 75 mcg by mouth every morning. 06/21/23  Yes [provider]  pantoprazole  (PROTONIX ) 20 MG tablet Take 1 tablet (20 mg total) by mouth daily. 04/13/24  Yes Beola Terrall RAMAN, PA-C  promethazine -dextromethorphan (PROMETHAZINE -DM) 6.25-15 MG/5ML syrup Take 5 mLs  by mouth at bedtime as needed for cough.   Yes [provider]  valsartan (DIOVAN) 80 MG tablet Take 80 mg by mouth daily. 09/29/22  Yes [provider]  Cholecalciferol  (VITAMIN D -1000 MAX ST) 25 MCG (1000 UT) tablet Take 1 tablet (1,000 Units total) by mouth daily. Patient not taking: Reported on 04/21/2024 08/13/23   Flint Sonny POUR, PA-C  fluticasone  furoate-vilanterol (BREO ELLIPTA ) 100-25 MCG/ACT AEPB Inhale 1 puff into the lungs daily. Patient not taking: Reported on 04/21/2024 10/01/22   Rudy Sieving, MD  gabapentin  (NEURONTIN ) 300 MG capsule Take 1 capsule (300 mg total) by mouth 3 (three) times daily as needed for up to 21 doses. Patient not taking: Reported on 04/21/2024 02/07/24   Cottie Donnice PARAS, MD  Multiple Vitamin (MULTI-VITAMIN) tablet Take 1 tablet by mouth daily. Patient not taking: Reported on 04/21/2024 05/08/16   [provider]  umeclidinium bromide  (INCRUSE ELLIPTA ) 62.5 MCG/ACT AEPB Inhale 1 puff into the lungs daily. Patient not taking: Reported on 04/21/2024 10/02/22   Forest Coy, MD   MR HAND LEFT W WO CONTRAST Result Date: 04/21/2024 CLINICAL DATA:  Soft tissue infection related to insect bite a EXAM: MRI OF THE LEFT HAND WITHOUT AND WITH CONTRAST TECHNIQUE: Multiplanar, multisequence MR imaging of the left hand was performed before and  after the administration of intravenous contrast. CONTRAST:  8mL GADAVIST  GADOBUTROL  1 MMOL/ML IV SOLN COMPARISON:  Radiograph 04/21/2024 FINDINGS: Bones/Joint/Cartilage Unremarkable Ligaments Grossly unremarkable Muscles and Tendons Unremarkable Soft tissues Enhancing subcutaneous edema in the dorsum of the wrist and hand, and tracking into the second through fifth fingers, favoring cellulitis. Small focal subcutaneous fluid collection measuring 1.1 by 0.4 by 1.0 cm (volume = 0.2 cm^3) noted dorsal to the distal middle finger metacarpal metadiaphysis. Suspected ganglion cyst volar to the radiocarpal articulation extending deep to the radial artery and flexor carpi radialis tendon on image 40 series 8, and possibly with a superficial component as well. This measures about 1.1 by 0.8 by 0.9 cm (volume = 0.4 cm^3) and may have mild internal septation. IMPRESSION: 1. Enhancing subcutaneous edema in the dorsum of the wrist and hand, and tracking into the second through fifth fingers, favoring cellulitis. Small focal subcutaneous fluid collection measuring 1.1 by 0.4 by 1.0 cm (volume = 0.2 cm^ 3) noted dorsal to the distal middle finger metacarpal metadiaphysis, suspicious for a small abscess or dorsally draining fluid collection. 2. Suspected ganglion cyst volar to the radiocarpal articulation extending deep to the radial artery and flexor carpi radialis tendon, and possibly with a superficial component as well. Electronically Signed   By: Ryan Salvage M.D.   On: 04/21/2024 18:18   DG Hand 2 View Left Result Date: 04/21/2024 CLINICAL DATA:  Left hand infection after insect bite. EXAM: LEFT HAND - 2 VIEW COMPARISON:  None Available. FINDINGS: There is no evidence of fracture or dislocation. There is no evidence of arthropathy or other focal bone abnormality. Mild dorsal soft tissue swelling is noted. IMPRESSION: Mild dorsal soft tissue swelling is noted suggesting infection. No fracture or dislocation.  Electronically Signed   By: Lynwood Landy Raddle M.D.   On: 04/21/2024 13:53   - Positive ROS: All other systems have been reviewed and were otherwise negative with the exception of those mentioned in the HPI and as above.  Physical Exam: General: No acute distress, resting comfortably Cardiovascular: BUE warm and well perfused, normal rate Respiratory: Normal WOB on RA Skin: Warm and dry Neurologic: Sensation intact distally Psychiatric: Patient  is at baseline mood and affect  Left upper Extremity  There is a small puncture wound at the dorsal aspect of the hand in line with the middle finger.  There is surrounding erythema and induration with a small area of purulent drainage.  There is some darker blackish purple discoloration of her skin in the area around this lesion.  There is some mild erythema tracking dorsally to the level around the wrist extension crease.  There is moderate diffuse swelling of her hand.  She has near full active range of motion of her fingers but lacks about a centimeter or so of the distal palmar crease secondary to pain and swelling.  Sensation is intact light touch in the median, ulnar, and radial nerve distributions.  All fingers are warm and well-perfused with brisk capillary refill.   Assessment: Patient is a 68 year old female with a left dorsal hand abscess that is failed outpatient management with oral antibiotics.  Plan: We reviewed the nature of her hand infection at length this evening.  We discussed both nonsurgical and surgical management.  We reviewed the risks of surgery would include bleeding, persistent infection, damage to extensor tendons or small cutaneous nerve branches, delayed wound healing, persistent pain or swelling in the hand, need for additional surgery.  We reviewed the expected postoperative course and recovery process.  After our discussion today, patient has elected to proceed with surgery.  We will plan to take intraoperative cultures.   Antibiotic choice and duration pending culture results.   We will start wound care 24 hours after surgery with warm, diluted Hibiclens  soaks and application of clean, dry dressings.  I discussed with patient that this she will continue this wound care regimen at home.  We will plan on surgery in the morning.  Please keep n.p.o. after midnight.  Thank you for the consult and the opportunity to see Ms. Foland  Jalyne Brodzinski, M.D. [facility] 7:20 PM

## 2024-04-21 NOTE — ED Triage Notes (Signed)
 Pt states possible spider bite on left hand 3 days ago. Hx of COPD. Denies fevers. Left hand is red and swollen.

## 2024-04-21 NOTE — Discharge Instructions (Signed)
 Please head to [facility] ED for further evaluation of your left hand infection, as you may need IV antibiotics.

## 2024-04-21 NOTE — Progress Notes (Signed)
 Pharmacy Antibiotic Note  Zoe Snyder is a 68 y.o. female for which pharmacy has been consulted for vancomycin  dosing for cellulitis.  Patient with a history of critical limb ischemia of leg s/p iliac artery stenting, DVT on eliquis , COPD. Patient presenting with worsening left hand infection .  SCr 1.53 - above baseline WBC 13.2; LA 1; T 98.7; HR 77; RR 20  Plan: Vancomycin  2000 mg once then 1750 mg q48hr (eAUC 515.4) unless change in renal function Monitor WBC, fever, renal function, cultures F/u Ortho plan De-escalate when able Levels at steady state  Height: 5' 3 (160 cm) Weight: 81.6 kg (180 lb) IBW/kg (Calculated) : 52.4  Temp (24hrs), Avg:98.8 F (37.1 C), Min:98.4 F (36.9 C), Max:99.1 F (37.3 C)  Recent Labs  Lab 04/21/24 1002 04/21/24 1016  WBC 13.2*  --   CREATININE 1.53*  --   LATICACIDVEN  --  1.1    Estimated Creatinine Clearance: 36.1 mL/min (A) (by C-G formula based on SCr of 1.53 mg/dL (H)).    Allergies  Allergen Reactions   Epinephrine Anaphylaxis    Respiratory problems, e.g., wheezing;  Palpitations   Palpitations  Respiratory problems, e.g., wheezing;, Palpitations   Novocain [Procaine] Palpitations and Other (See Comments)    Heart racing   Theraflu Severe Cold Daytime [Dm-Phenylephrine -Acetaminophen ] Palpitations   Microbiology results: Pending  Thank you for allowing pharmacy to be a part of this patient's care.  Dorn Buttner, PharmD, BCPS 04/21/2024 2:17 PM ED Clinical Pharmacist -  [phone]

## 2024-04-21 NOTE — Consult Note (Signed)
 Reason for Consult:Left hand infection Referring Physician: Prentice Medicus Time called: 1126 Time at bedside: 1210   Zoe Snyder is an 68 y.o. female.  HPI: Zoe Snyder developed what looked like a pimple on her left hand about 3d ago. It progressed to more severe pain and discoloration. She went to UC yesterday and received Rocephin  and Keflex but returned to today with worsening and extension of pain into the FA and was directed to come to the ED. Hand surgery was consulted at that point. She denies prior e/o similar and doesn't recall any antecedent event or cuts. She is LHD and works at [employer].  Past Medical History:  Diagnosis Date   COPD (chronic obstructive pulmonary disease) (HCC)    Hashimoto's thyroiditis    History of alcohol abuse 11/30/2017   History of cocaine abuse (HCC) 11/30/2017   History of colon polyps    Hyperlipidemia    Hypertension    Prediabetes    Throat cancer (HCC)    Tobacco use disorder     Past Surgical History:  Procedure Laterality Date   ANGIOPLASTY  11/18/2023   Procedure: LEFT COMMON ILIAC ANGIOPLASTY USING VIABAHN 7 MM X AND 7 MM X 59 MM STENTS;  Surgeon: Magda Debby SAILOR, MD;  Location: [facility] OR;  Service: Vascular;;   AORTOGRAM  11/18/2023   Procedure: AORTOGRAM;  Surgeon: Magda Debby SAILOR, MD;  Location: [facility] OR;  Service: Vascular;;   COLONOSCOPY W/ POLYPECTOMY  2016   GROIN EXPOSURE Left 11/18/2023   Procedure: LEFT GROIN AND FEMORAL ARTERY EXPOSURE;  Surgeon: Magda Debby SAILOR, MD;  Location: [facility] OR;  Service: Vascular;  Laterality: Left;   THROAT SURGERY  2018   cancer   ULTRASOUND GUIDANCE FOR VASCULAR ACCESS Right 11/18/2023   Procedure: ULTRASOUND GUIDANCE FOR VASCULAR ACCESS;  Surgeon: Magda Debby SAILOR, MD;  Location: [facility] OR;  Service: Vascular;  Laterality: Right;    Family History  Problem Relation Age of Onset   Hypertension Mother    Diabetes Maternal Grandmother    Colon cancer Maternal Aunt     Social History:  reports that she has  been smoking cigarettes. She has a 22.5 pack-year smoking history. She has never used smokeless tobacco. She reports current alcohol use. She reports that she does not currently use drugs after having used the following drugs: Marijuana.  Allergies:  Allergies  Allergen Reactions   Epinephrine Anaphylaxis    Respiratory problems, e.g., wheezing;  Palpitations   Palpitations  Respiratory problems, e.g., wheezing;, Palpitations   Novocain [Procaine] Palpitations and Other (See Comments)    Heart racing   Theraflu Severe Cold Daytime [Dm-Phenylephrine -Acetaminophen ] Palpitations    Medications: I have reviewed the patient's current medications.  Results for orders placed or performed during the hospital encounter of 04/21/24 (from the past 48 hours)  Urinalysis, w/ Reflex to Culture (Infection Suspected) -Urine, Clean Catch     Status: Abnormal   Collection Time: 04/21/24 10:00 AM  Result Value Ref Range   Specimen Source URINE, CLEAN CATCH    Color, Urine YELLOW YELLOW   APPearance CLOUDY (A) CLEAR   Specific Gravity, Urine 1.015 1.005 - 1.030   pH 5.0 5.0 - 8.0   Glucose, UA 50 (A) NEGATIVE mg/dL   Hgb urine dipstick LARGE (A) NEGATIVE   Bilirubin Urine NEGATIVE NEGATIVE   Ketones, ur NEGATIVE NEGATIVE mg/dL   Protein, ur 30 (A) NEGATIVE mg/dL   Nitrite NEGATIVE NEGATIVE   Leukocytes,Ua TRACE (A) NEGATIVE   RBC / HPF >  50 0 - 5 RBC/hpf   WBC, UA 6-10 0 - 5 WBC/hpf    Comment:        Reflex urine culture not performed if WBC <=10, OR if Squamous epithelial cells >5. If Squamous epithelial cells >5 suggest recollection.    Bacteria, UA MANY (A) NONE SEEN   Squamous Epithelial / HPF 0-5 0 - 5 /HPF   Mucus PRESENT    Hyaline Casts, UA PRESENT     Comment: Performed at [facility], [address] [place], [place] [address]  Comprehensive metabolic panel     Status: Abnormal (Preliminary result)   Collection Time: 04/21/24 10:02 AM  Result Value Ref Range   Sodium  138 135 - 145 mmol/L   Potassium 3.9 3.5 - 5.1 mmol/L   Chloride 106 98 - 111 mmol/L   CO2 20 (L) 22 - 32 mmol/L   Glucose, Bld 182 (H) 70 - 99 mg/dL    Comment: Glucose reference range applies only to samples taken after fasting for at least 8 hours.   BUN 22 8 - 23 mg/dL   Creatinine, Ser 8.46 (H) 0.44 - 1.00 mg/dL   Calcium  9.4 8.9 - 10.3 mg/dL   Total Protein 6.9 6.5 - 8.1 g/dL   Albumin 3.4 (L) 3.5 - 5.0 g/dL   AST PENDING 15 - 41 U/L   ALT 13 0 - 44 U/L   Alkaline Phosphatase 70 38 - 126 U/L   Total Bilirubin 0.7 0.0 - 1.2 mg/dL   GFR, Estimated 37 (L) >60 mL/min    Comment: (NOTE) Calculated using the CKD-EPI Creatinine Equation (2021)    Anion gap 12 5 - 15    Comment: Performed at [facility], [address] [place], [place] [address]  CBC with Differential     Status: Abnormal   Collection Time: 04/21/24 10:02 AM  Result Value Ref Range   WBC 13.2 (H) 4.0 - 10.5 K/uL   RBC 4.14 3.87 - 5.11 MIL/uL   Hemoglobin 12.5 12.0 - 15.0 g/dL   HCT 61.8 63.9 - 53.9 %   MCV 92.0 80.0 - 100.0 fL   MCH 30.2 26.0 - 34.0 pg   MCHC 32.8 30.0 - 36.0 g/dL   RDW 86.5 88.4 - 84.4 %   Platelets 239 150 - 400 K/uL   nRBC 0.0 0.0 - 0.2 %   Neutrophils Relative % 76 %   Neutro Abs 10.2 (H) 1.7 - 7.7 K/uL   Lymphocytes Relative 13 %   Lymphs Abs 1.8 0.7 - 4.0 K/uL   Monocytes Relative 8 %   Monocytes Absolute 1.0 0.1 - 1.0 K/uL   Eosinophils Relative 1 %   Eosinophils Absolute 0.1 0.0 - 0.5 K/uL   Basophils Relative 1 %   Basophils Absolute 0.1 0.0 - 0.1 K/uL   Immature Granulocytes 1 %   Abs Immature Granulocytes 0.06 0.00 - 0.07 K/uL    Comment: Performed at [facility], [address], [place], [place] [address]  I-Stat Lactic Acid, ED     Status: None   Collection Time: 04/21/24 10:16 AM  Result Value Ref Range   Lactic Acid, Venous 1.1 0.5 - 1.9 mmol/L    No results found.  Review of Systems  HENT:  Negative for ear discharge, ear pain, hearing loss and  tinnitus.   Eyes:  Negative for photophobia and pain.  Respiratory:  Negative for cough and shortness of breath.   Cardiovascular:  Negative  for chest pain.  Gastrointestinal:  Negative for abdominal pain, nausea and vomiting.  Genitourinary:  Negative for dysuria, flank pain, frequency and urgency.  Musculoskeletal:  Positive for arthralgias (Left hand/FA). Negative for back pain, myalgias and neck pain.  Neurological:  Negative for dizziness and headaches.  Hematological:  Does not bruise/bleed easily.  Psychiatric/Behavioral:  The patient is not nervous/anxious.    Blood pressure 130/85, pulse 77, temperature 98.8 F (37.1 C), temperature source Oral, resp. rate 20, height 5' 3 (1.6 m), weight 81.6 kg, SpO2 98%. Physical Exam Constitutional:      General: She is not in acute distress.    Appearance: She is well-developed. She is not diaphoretic.  HENT:     Head: Normocephalic and atraumatic.  Eyes:     General: No scleral icterus.       Right eye: No discharge.        Left eye: No discharge.     Conjunctiva/sclera: Conjunctivae normal.  Cardiovascular:     Rate and Rhythm: Normal rate and regular rhythm.  Pulmonary:     Effort: Pulmonary effort is normal. No respiratory distress.  Musculoskeletal:     Cervical back: Normal range of motion.     Comments: Left shoulder, elbow, wrist, digits- Dorsal swelling, mostly over radial aspect proximal to index/long fingers, discolored, no definite fluctuance but bloody discharge from punctate wound in center, mod TTP extending to distal FA, no instability, no blocks to motion  Sens  Ax/R/M/U intact  Mot   Ax/ R/ PIN/ M/ AIN/ U intact  Rad 2+  Skin:    General: Skin is warm and dry.  Neurological:     Mental Status: She is alert.  Psychiatric:        Mood and Affect: Mood normal.        Behavior: Behavior normal.     Assessment/Plan: Left hand infection -- Will get x-ray and MRI. Plan on I&D tomorrow morning with Dr. Romona if  not adequate improvement overnight. Please keep NPO after MN.    [name] DOROTHA Ned, PA-C Orthopedic Surgery [phone] 04/21/2024, 12:29 PM

## 2024-04-21 NOTE — H&P (Signed)
 Date: 04/21/2024         Patient Name:  Zoe Snyder MRN: [id number]  DOB: Jun 22, 1956 Age / Sex: 68 y.o., female   PCP: [facility]         Medical Service: Internal Medicine Teaching Service         Attending Physician: Dr. Francesco Elsie NOVAK, MD    First Contact: Dr. Myrna Pager: [phone]  Second Contact: Dr. Elnora Pager: [phone]                 Chief Concern: My left hand hurts  History of Present Illness: 68 year old came to the ED by way of referral from urgent care for worsening left hand infection.  She notes a small blister or pustule with dark appearance to the center that started 3 days ago.  She is uncertain of inciting events like bites or wounds.  She denies any injury to her hand.  She wonders if she had a bug bite when she was laying in bed.  She went to urgent care and doxycycline  was prescribed but unfortunately at 24-hour follow-up her wound was thought to be worse so she was referred to the emergency room.  Medical history is notable for critical limb ischemia of the left leg for which she had iliac artery stenting in February.  She also had a left femoral and popliteal vein DVT at that time.  She takes Eliquis  and Plavix  for these problems.  Other notable history includes COPD for which she uses as needed inhalers.  She lives in [place] with a friend.  She works for [employer].  She smokes cigarettes, rarely drinks alcohol, does not use IV drugs.  Review of Systems  Constitutional:  Negative for chills and fever.  Respiratory:  Negative for shortness of breath.   Gastrointestinal:  Positive for diarrhea (Chronic).  Genitourinary:  Negative for dysuria and flank pain.       No decreased urine output  Endo/Heme/Allergies:  Bruises/bleeds easily.   In the ED she was stable.  Vancomycin  was started.  Ortho hand consulted who saw her and may take her to the operating room tomorrow.  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  Medications: No current facility-administered medications on file prior to encounter.   Current Outpatient Medications on File Prior to Encounter  Medication Sig Dispense Refill   albuterol  (PROVENTIL ) (2.5 MG/3ML) 0.083% nebulizer solution Inhale 3 mLs (2.5 mg total) by nebulization every 6 (six) hours as needed for wheezing or shortness of breath. 270 mL 0   albuterol  (VENTOLIN  HFA) 108 (90 Base) MCG/ACT inhaler Inhale 2 puffs into the lungs every 4 (four) hours as needed for wheezing or shortness of breath. 8 g 0   amLODipine  (NORVASC ) 10 MG tablet Take 1 tablet (10 mg total) by mouth daily. Due for follow up visit 30 tablet 1   apixaban  (ELIQUIS ) 5 MG TABS tablet Take 1 tablet (5 mg total) by mouth 2 (two) times daily. 60 tablet 11   aspirin  EC 81 MG tablet Take 1 tablet (81 mg total) by mouth daily at 6 (six) AM. Swallow whole. 30  tablet 12   atorvastatin  (LIPITOR) 20 MG tablet Take 20 mg by mouth at bedtime.     Cholecalciferol  (VITAMIN D -1000 MAX ST) 25 MCG (1000 UT) tablet Take 1 tablet (1,000 Units total) by mouth daily. 12 tablet 0   clopidogrel  (PLAVIX ) 75 MG tablet Take 1 tablet (75 mg total) by mouth daily. 30 tablet 3   doxycycline  (VIBRAMYCIN ) 100 MG capsule Take 1 capsule (100 mg total) by mouth 2 (two) times daily. 20 capsule 0   famotidine  (PEPCID ) 20 MG tablet Take 1 tablet (20 mg total) by mouth daily. 30 tablet 0   fluticasone  furoate-vilanterol (BREO ELLIPTA ) 100-25 MCG/ACT AEPB Inhale 1 puff into the lungs daily. 60 each 0   gabapentin  (NEURONTIN ) 300 MG capsule  Take 1 capsule (300 mg total) by mouth 3 (three) times daily as needed for up to 21 doses. 21 capsule 0   ipratropium-albuterol  (DUONEB) 0.5-2.5 (3) MG/3ML SOLN Take 3 mLs by nebulization every 4 (four) hours as needed. 360 mL 1   levothyroxine  (SYNTHROID ) 75 MCG tablet Take 75 mcg by mouth every morning.     methylPREDNISolone  (MEDROL ) 4 MG tablet Take 4 mg by mouth See admin instructions. Take 6 tablets on day 1 Take 5 tablets on day 2 Take 4 tablets on day 3  Take 3 tablets on day 4 Take 2 tablets on day 5 Take 1 tablet on day 6 (Patient not taking: Reported on 11/19/2023)     Multiple Vitamin (MULTI-VITAMIN) tablet Take 1 tablet by mouth daily.     oxyCODONE  (OXY IR/ROXICODONE ) 5 MG immediate release tablet Take 1 tablet (5 mg total) by mouth every 6 (six) hours as needed for moderate pain (pain score 4-6). (Patient not taking: Reported on 12/28/2023) 20 tablet 0   pantoprazole  (PROTONIX ) 20 MG tablet Take 1 tablet (20 mg total) by mouth daily. 30 tablet 0   predniSONE  (DELTASONE ) 20 MG tablet Take 40 mg by mouth daily with breakfast. For 5 days. (Patient not taking: Reported on 11/19/2023)     promethazine -dextromethorphan (PROMETHAZINE -DM) 6.25-15 MG/5ML syrup Take 5 mLs by mouth at bedtime as needed for cough.     umeclidinium bromide  (INCRUSE ELLIPTA ) 62.5 MCG/ACT AEPB Inhale 1 puff into the lungs daily. 30 each 0   valsartan (DIOVAN) 80 MG tablet Take 80 mg by mouth daily.       Surgical History: Per above.  Family History:  Reviewed, no pertinent updates.  Social History:  Per above.  Physical Exam: Blood pressure 130/85, pulse 77, temperature 98.8 F (37.1 C), temperature source Oral, resp. rate 20, height 5' 3 (1.6 m), weight 81.6 kg, SpO2 98%.  No distress Oral mucosas moist and pink Heart rate and rhythm are normal, radial pulses are strong, no apparent murmurs Breathing comfortably on room air, lungs are clear Abdomen is nontender Skin is warm and dry Left hand with  large erythema and swelling, pustule that appears open with some surrounding purpura Left hand finger flexion limited by pain Alert and oriented, speech is normal, no facial asymmetry  EKG:  N/A  Labs: Creatinine 1.5 from baseline of around 1 Mild leukocytosis with neutrophilic predominance  Images and other studies: Left hand x-ray with dorsal soft tissue swelling  Assessment & Plan:  Zoe Snyder is a 68 y.o. with COPD and anticoagulated for VTE, on Plavix  for lower extremity arterial disease presents with days of left hand pain and swelling is admitted for purulent cellulitis.  Principal Problem:   Cellulitis Without sepsis or  systemic illness. Purulent. Uncertain of the inciting event. Treating empirically for MRSA. MRI left hand pending. Ortho hand to follow, may I&D tomorrow morning pending clinical course. She doesn't have risk factors for pseudomonas SSTI. -vancomycin  per pharmacy -MRI left hand pending -NPO at midnight  Active Problems:   COPD (chronic obstructive pulmonary disease) (HCC) Chronic and stable, no dyspnea. Start LAMA, not taking this outside hospital. -umeclindinium 1 puff daily    AKI (acute kidney injury) (HCC) This may be CKD. I note elevated creatinine after hospitalization for critical limb ischemia and DVT. So tempo of this creatinine rise is uncertain. UA, if bland try trending creatinine after hydrating, otherwise consider renal imaging.    Hypercoagulable state (HCC) VTE diagnosed concomitantly with her critical limb ischemia. She took her Eliquis  this morning. Will hold anticoagulation in case of surgery tomorrow.    PAD (peripheral artery disease) (HCC) Stable. Holding outpatient antithrombotics in case of surgery.  Level of care: med surg Diet: NPO at midnight IVF: n/a VTE: held pending operative plan Code: full Surrogate: Son, lives out of state and she will provide his contact information which we don't have right  now  Signed: [name] Kung MD 04/21/2024, 2:28 PM Pager: [phone]

## 2024-04-21 NOTE — Consult Note (Signed)
 HAND SURGERY CONSULTATION  REQUESTING PHYSICIAN: Francesco Elsie NOVAK, MD   Chief Complaint: Left hand pain  HPI: Zoe Snyder is a 68 y.o. female who presents with an infection involving the dorsum of her left hand.  This started 3 days ago with a small blister at the dorsum of her hand.  She is  unsure if there was a bug bite or other superficial wound.  She was seen by an urgent care and was discharged on oral doxycycline .  She returned to the emergency room today with worsening pain, swelling, and erythema.  She describes pain at the dorsal aspect of her hand that is worse with attempted range of motion of her wrist and fingers.  She denies any pain in the forearm or elbow.  She denies any numbness or paresthesias in her fingers.  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  She is currently on Eliquis  and Plavix .  She is a chronic tobacco user.   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  Past Medical History:  Diagnosis Date   COPD (chronic obstructive pulmonary disease) (HCC)    Hashimoto's thyroiditis    History of alcohol abuse 11/30/2017   History of cocaine abuse (HCC) 11/30/2017   History of colon polyps    Hyperlipidemia    Hypertension    Prediabetes    Throat cancer (HCC)    Tobacco use disorder    Past Surgical History:  Procedure Laterality Date   ANGIOPLASTY  11/18/2023   Procedure: LEFT COMMON ILIAC ANGIOPLASTY USING VIABAHN 7 MM X AND 7 MM X 59 MM STENTS;  Surgeon: Magda Debby SAILOR, MD;  Location: [facility] OR;  Service: Vascular;;   AORTOGRAM  11/18/2023   Procedure: AORTOGRAM;  Surgeon: Magda Debby SAILOR, MD;  Location: [facility] OR;  Service: Vascular;;   COLONOSCOPY W/ POLYPECTOMY  2016   GROIN EXPOSURE Left 11/18/2023   Procedure: LEFT GROIN AND FEMORAL ARTERY EXPOSURE;  Surgeon: Magda Debby SAILOR, MD;  Location: [facility] OR;  Service: Vascular;  Laterality: Left;   THROAT SURGERY  2018   cancer    ULTRASOUND GUIDANCE FOR VASCULAR ACCESS Right 11/18/2023   Procedure: ULTRASOUND GUIDANCE FOR VASCULAR ACCESS;  Surgeon: Magda Debby SAILOR, MD;  Location: [facility] OR;  Service: Vascular;  Laterality: Right;   Social History   Socioeconomic History   Marital status: Single    Spouse name: Not on file   Number of children: Not on file   Years of education: Not on file   Highest education level: Not on file  Occupational History   Not on file  Tobacco Use   Smoking status: Every Day    Current packs/day: 0.50    Average packs/day: 0.5 packs/day for 45.0 years (22.5 ttl pk-yrs)    Types: Cigarettes   Smokeless tobacco: Never   Tobacco comments:    currently smoking 0.5 ppd, but she has cut down from over 1 ppd  Vaping Use   Vaping status: Never Used  Substance and Sexual Activity   Alcohol use: Yes   Drug use: Not Currently    Types: Marijuana   Sexual activity: Not Currently  Other Topics Concern   Not on file  Social History Narrative   Not on file   Social Drivers of Health   Financial Resource Strain: Not on File (06/05/2018)   Received from [employer] [facility]    Financial Resource Strain:  0  Food Insecurity: No Food Insecurity (04/21/2024)   Hunger Vital Sign    Worried About Running Out of Food in the Last Year: Never true    Ran Out of Food in the Last Year: Never true  Transportation Needs: No Transportation Needs (04/21/2024)   PRAPARE - Administrator, Civil Service (Medical): No    Lack of Transportation (Non-Medical): No  Physical Activity: Not on File (06/05/2018)   Received from [facility]   Physical Activity    Physical Activity: 0  Stress: Not on File (06/05/2018)   Received from [facility]   Stress    Stress: 0  Social Connections: Unknown (04/21/2024)   Social Connection and Isolation Panel    Frequency of Communication with Friends and Family: Never    Frequency of Social Gatherings with Friends and Family: Never    Attends Religious  Services: Never    Database administrator or Organizations: Yes    Attends Engineer, structural: More than 4 times per year    Marital Status: Patient declined   Family History  Problem Relation Age of Onset   Hypertension Mother    Diabetes Maternal Grandmother    Colon cancer Maternal Aunt    - negative except otherwise stated in the family history section Allergies  Allergen Reactions   Epinephrine Anaphylaxis    Respiratory problems, e.g., wheezing;  Palpitations   Palpitations  Respiratory problems, e.g., wheezing;, Palpitations   Novocain [Procaine] Palpitations and Other (See Comments)    Heart racing   Theraflu Severe Cold Daytime [Dm-Phenylephrine -Acetaminophen ] Palpitations   Prior to Admission medications   Medication Sig Start Date End Date Taking? Authorizing Provider  albuterol  (PROVENTIL ) (2.5 MG/3ML) 0.083% nebulizer solution Inhale 3 mLs (2.5 mg total) by nebulization every 6 (six) hours as needed for wheezing or shortness of breath. 03/16/23  Yes Arloa Suzen RAMAN, NP  albuterol  (VENTOLIN  HFA) 108 (90 Base) MCG/ACT inhaler Inhale 2 puffs into the lungs every 4 (four) hours as needed for wheezing or shortness of breath. 03/16/23  Yes Arloa Suzen RAMAN, NP  amLODipine  (NORVASC ) 10 MG tablet Take 1 tablet (10 mg total) by mouth daily. Due for follow up visit 08/05/21  Yes Freddi Hamilton, MD  apixaban  (ELIQUIS ) 5 MG TABS tablet Take 1 tablet (5 mg total) by mouth 2 (two) times daily. 03/28/24  Yes Magda Debby SAILOR, MD  aspirin  EC 81 MG tablet Take 1 tablet (81 mg total) by mouth daily at 6 (six) AM. Swallow whole. 11/20/23  Yes Bethanie Cough, PA-C  atorvastatin  (LIPITOR) 20 MG tablet Take 20 mg by mouth at bedtime.   Yes [provider]  clopidogrel  (PLAVIX ) 75 MG tablet Take 1 tablet (75 mg total) by mouth daily. 12/03/23  Yes Baglia, Corrina, PA-C  doxycycline  (VIBRAMYCIN ) 100 MG capsule Take 1 capsule (100 mg total) by mouth 2 (two) times daily.  04/20/24  Yes Flint Raring K, PA-C  famotidine  (PEPCID ) 20 MG tablet Take 1 tablet (20 mg total) by mouth daily. 04/13/24  Yes Bauer, Collin S, PA-C  ipratropium-albuterol  (DUONEB) 0.5-2.5 (3) MG/3ML SOLN Take 3 mLs by nebulization every 4 (four) hours as needed. 10/22/23  Yes Joesph Shaver Scales, PA-C  levothyroxine  (SYNTHROID ) 75 MCG tablet Take 75 mcg by mouth every morning. 06/21/23  Yes [provider]  pantoprazole  (PROTONIX ) 20 MG tablet Take 1 tablet (20 mg total) by mouth daily. 04/13/24  Yes Beola Terrall RAMAN, PA-C  promethazine -dextromethorphan (PROMETHAZINE -DM) 6.25-15 MG/5ML syrup Take 5 mLs  by mouth at bedtime as needed for cough.   Yes [provider]  valsartan (DIOVAN) 80 MG tablet Take 80 mg by mouth daily. 09/29/22  Yes [provider]  Cholecalciferol  (VITAMIN D -1000 MAX ST) 25 MCG (1000 UT) tablet Take 1 tablet (1,000 Units total) by mouth daily. Patient not taking: Reported on 04/21/2024 08/13/23   Flint Sonny POUR, PA-C  fluticasone  furoate-vilanterol (BREO ELLIPTA ) 100-25 MCG/ACT AEPB Inhale 1 puff into the lungs daily. Patient not taking: Reported on 04/21/2024 10/01/22   Rudy Sieving, MD  gabapentin  (NEURONTIN ) 300 MG capsule Take 1 capsule (300 mg total) by mouth 3 (three) times daily as needed for up to 21 doses. Patient not taking: Reported on 04/21/2024 02/07/24   Cottie Donnice PARAS, MD  Multiple Vitamin (MULTI-VITAMIN) tablet Take 1 tablet by mouth daily. Patient not taking: Reported on 04/21/2024 05/08/16   [provider]  umeclidinium bromide  (INCRUSE ELLIPTA ) 62.5 MCG/ACT AEPB Inhale 1 puff into the lungs daily. Patient not taking: Reported on 04/21/2024 10/02/22   Forest Coy, MD   MR HAND LEFT W WO CONTRAST Result Date: 04/21/2024 CLINICAL DATA:  Soft tissue infection related to insect bite a EXAM: MRI OF THE LEFT HAND WITHOUT AND WITH CONTRAST TECHNIQUE: Multiplanar, multisequence MR imaging of the left hand was performed before and  after the administration of intravenous contrast. CONTRAST:  8mL GADAVIST  GADOBUTROL  1 MMOL/ML IV SOLN COMPARISON:  Radiograph 04/21/2024 FINDINGS: Bones/Joint/Cartilage Unremarkable Ligaments Grossly unremarkable Muscles and Tendons Unremarkable Soft tissues Enhancing subcutaneous edema in the dorsum of the wrist and hand, and tracking into the second through fifth fingers, favoring cellulitis. Small focal subcutaneous fluid collection measuring 1.1 by 0.4 by 1.0 cm (volume = 0.2 cm^3) noted dorsal to the distal middle finger metacarpal metadiaphysis. Suspected ganglion cyst volar to the radiocarpal articulation extending deep to the radial artery and flexor carpi radialis tendon on image 40 series 8, and possibly with a superficial component as well. This measures about 1.1 by 0.8 by 0.9 cm (volume = 0.4 cm^3) and may have mild internal septation. IMPRESSION: 1. Enhancing subcutaneous edema in the dorsum of the wrist and hand, and tracking into the second through fifth fingers, favoring cellulitis. Small focal subcutaneous fluid collection measuring 1.1 by 0.4 by 1.0 cm (volume = 0.2 cm^ 3) noted dorsal to the distal middle finger metacarpal metadiaphysis, suspicious for a small abscess or dorsally draining fluid collection. 2. Suspected ganglion cyst volar to the radiocarpal articulation extending deep to the radial artery and flexor carpi radialis tendon, and possibly with a superficial component as well. Electronically Signed   By: Ryan Salvage M.D.   On: 04/21/2024 18:18   DG Hand 2 View Left Result Date: 04/21/2024 CLINICAL DATA:  Left hand infection after insect bite. EXAM: LEFT HAND - 2 VIEW COMPARISON:  None Available. FINDINGS: There is no evidence of fracture or dislocation. There is no evidence of arthropathy or other focal bone abnormality. Mild dorsal soft tissue swelling is noted. IMPRESSION: Mild dorsal soft tissue swelling is noted suggesting infection. No fracture or dislocation.  Electronically Signed   By: Lynwood Landy Raddle M.D.   On: 04/21/2024 13:53   - Positive ROS: All other systems have been reviewed and were otherwise negative with the exception of those mentioned in the HPI and as above.  Physical Exam: General: No acute distress, resting comfortably Cardiovascular: BUE warm and well perfused, normal rate Respiratory: Normal WOB on RA Skin: Warm and dry Neurologic: Sensation intact distally Psychiatric: Patient  is at baseline mood and affect  Left upper Extremity  There is a small puncture wound at the dorsal aspect of the hand in line with the middle finger.  There is surrounding erythema and induration with a small area of purulent drainage.  There is some darker blackish purple discoloration of her skin in the area around this lesion.  There is some mild erythema tracking dorsally to the level around the wrist extension crease.  There is moderate diffuse swelling of her hand.  She has near full active range of motion of her fingers but lacks about a centimeter or so of the distal palmar crease secondary to pain and swelling.  Sensation is intact light touch in the median, ulnar, and radial nerve distributions.  All fingers are warm and well-perfused with brisk capillary refill.   Assessment: Patient is a 68 year old female with a left dorsal hand abscess that is failed outpatient management with oral antibiotics.  Plan: We reviewed the nature of her hand infection at length this evening.  We discussed both nonsurgical and surgical management.  We reviewed the risks of surgery would include bleeding, persistent infection, damage to extensor tendons or small cutaneous nerve branches, delayed wound healing, persistent pain or swelling in the hand, need for additional surgery.  We reviewed the expected postoperative course and recovery process.  After our discussion today, patient has elected to proceed with surgery.  We will plan to take intraoperative cultures.   Antibiotic choice and duration pending culture results.   We will start wound care 24 hours after surgery with warm, diluted Hibiclens  soaks and application of clean, dry dressings.  I discussed with patient that this she will continue this wound care regimen at home.  We will plan on surgery in the morning.  Please keep n.p.o. after midnight.  Thank you for the consult and the opportunity to see Ms. Foland  Jalyne Brodzinski, M.D. [facility] 7:20 PM

## 2024-04-21 NOTE — ED Provider Notes (Signed)
 [place] EMERGENCY DEPARTMENT AT [facility] Provider Note   CSN: [id number] Arrival date & time: 04/21/24  9050     Patient presents with: Insect Bite   Zoe Snyder is a 68 y.o. female.   Patient sent here for evaluation of infection to her left hand.  Patient was seen by me yesterday at urgent care after being bitten by something 3 days ago.  Patient is unsure what might have bitten her.  Patient is on a blood thinner and had bleeding and a area of purplish swelling to begin with.  Patient reports over the past 3 days swelling has increased.  Patient was given Rocephin  and doxycycline  yesterday and scheduled for a 24-hour recheck at urgent care.  When she rechecked today it was noted that her swelling had increased.  And she had redness going further up her arm.  Patient reports that the pain has increased as well.  Patient is not having any fever or chills.  Patient reports that she is prediabetic.  The history is provided by the patient. No language interpreter was used.       Prior to Admission medications   Medication Sig Start Date End Date Taking? Authorizing Provider  albuterol  (PROVENTIL ) (2.5 MG/3ML) 0.083% nebulizer solution Inhale 3 mLs (2.5 mg total) by nebulization every 6 (six) hours as needed for wheezing or shortness of breath. 03/16/23   Arloa Suzen RAMAN, NP  albuterol  (VENTOLIN  HFA) 108 (90 Base) MCG/ACT inhaler Inhale 2 puffs into the lungs every 4 (four) hours as needed for wheezing or shortness of breath. 03/16/23   Arloa Suzen RAMAN, NP  amLODipine  (NORVASC ) 10 MG tablet Take 1 tablet (10 mg total) by mouth daily. Due for follow up visit 08/05/21   Freddi Hamilton, MD  apixaban  (ELIQUIS ) 5 MG TABS tablet Take 1 tablet (5 mg total) by mouth 2 (two) times daily. 03/28/24   Magda Debby SAILOR, MD  aspirin  EC 81 MG tablet Take 1 tablet (81 mg total) by mouth daily at 6 (six) AM. Swallow whole. 11/20/23   Bethanie Cough, PA-C  atorvastatin  (LIPITOR) 20 MG  tablet Take 20 mg by mouth at bedtime.    [provider]  Cholecalciferol  (VITAMIN D -1000 MAX ST) 25 MCG (1000 UT) tablet Take 1 tablet (1,000 Units total) by mouth daily. 08/13/23   Flint Sonny POUR, PA-C  clopidogrel  (PLAVIX ) 75 MG tablet Take 1 tablet (75 mg total) by mouth daily. 12/03/23   Baglia, Corrina, PA-C  doxycycline  (VIBRAMYCIN ) 100 MG capsule Take 1 capsule (100 mg total) by mouth 2 (two) times daily. 04/20/24   Timi Reeser K, PA-C  famotidine  (PEPCID ) 20 MG tablet Take 1 tablet (20 mg total) by mouth daily. 04/13/24   Bauer, Collin S, PA-C  fluticasone  furoate-vilanterol (BREO ELLIPTA ) 100-25 MCG/ACT AEPB Inhale 1 puff into the lungs daily. 10/01/22   Rudy Sieving, MD  gabapentin  (NEURONTIN ) 300 MG capsule Take 1 capsule (300 mg total) by mouth 3 (three) times daily as needed for up to 21 doses. 02/07/24   Cottie Cough PARAS, MD  ipratropium-albuterol  (DUONEB) 0.5-2.5 (3) MG/3ML SOLN Take 3 mLs by nebulization every 4 (four) hours as needed. 10/22/23   Joesph Shaver Scales, PA-C  levothyroxine  (SYNTHROID ) 75 MCG tablet Take 75 mcg by mouth every morning. 06/21/23   [provider]  methylPREDNISolone  (MEDROL ) 4 MG tablet Take 4 mg by mouth See admin instructions. Take 6 tablets on day 1 Take 5 tablets on day 2 Take 4 tablets on  day 3  Take 3 tablets on day 4 Take 2 tablets on day 5 Take 1 tablet on day 6 Patient not taking: Reported on 11/19/2023 10/22/23   [provider]  Multiple Vitamin (MULTI-VITAMIN) tablet Take 1 tablet by mouth daily. 05/08/16   [provider]  oxyCODONE  (OXY IR/ROXICODONE ) 5 MG immediate release tablet Take 1 tablet (5 mg total) by mouth every 6 (six) hours as needed for moderate pain (pain score 4-6). Patient not taking: Reported on 12/28/2023 11/19/23   Bethanie Cough, PA-C  pantoprazole  (PROTONIX ) 20 MG tablet Take 1 tablet (20 mg total) by mouth daily. 04/13/24   Bauer, Collin S, PA-C  predniSONE  (DELTASONE ) 20 MG tablet Take  40 mg by mouth daily with breakfast. For 5 days. Patient not taking: Reported on 11/19/2023    [provider]  promethazine -dextromethorphan (PROMETHAZINE -DM) 6.25-15 MG/5ML syrup Take 5 mLs by mouth at bedtime as needed for cough.    [provider]  umeclidinium bromide  (INCRUSE ELLIPTA ) 62.5 MCG/ACT AEPB Inhale 1 puff into the lungs daily. 10/02/22   Guilloud, Carolyn, MD  valsartan (DIOVAN) 80 MG tablet Take 80 mg by mouth daily. 09/29/22   [provider]    Allergies: Epinephrine, Novocain [procaine], and Theraflu severe cold daytime [dm-phenylephrine -acetaminophen ]    Review of Systems  All other systems reviewed and are negative.   Updated Vital Signs BP 130/85 (BP Location: Right Arm)   Pulse 77   Temp 98.8 F (37.1 C) (Oral)   Resp 20   Ht 5' 3 (1.6 m)   Wt 81.6 kg   SpO2 98%   BMI 31.89 kg/m   Physical Exam Vitals and nursing note reviewed.  Constitutional:      Appearance: She is well-developed.  HENT:     Head: Normocephalic.  Cardiovascular:     Rate and Rhythm: Normal rate.  Pulmonary:     Effort: Pulmonary effort is normal.  Abdominal:     General: There is no distension.  Musculoskeletal:        General: Swelling and tenderness present. Normal range of motion.     Cervical back: Normal range of motion.     Comments: Tender swollen left hand to mid forearm, 2 cm dark purple center dorsal hand neurovascular neurosensory are intact.  Skin:    General: Skin is warm.  Neurological:     General: No focal deficit present.     Mental Status: She is alert and oriented to person, place, and time.     (all labs ordered are listed, but only abnormal results are displayed) Labs Reviewed  COMPREHENSIVE METABOLIC PANEL WITH GFR - Abnormal; Notable for the following components:      Result Value   CO2 20 (*)    Glucose, Bld 182 (*)    Creatinine, Ser 1.53 (*)    Albumin 3.4 (*)    AST 11 (*)    GFR, Estimated 37 (*)    All other  components within normal limits  CBC WITH DIFFERENTIAL/PLATELET - Abnormal; Notable for the following components:   WBC 13.2 (*)    Neutro Abs 10.2 (*)    All other components within normal limits  URINALYSIS, W/ REFLEX TO CULTURE (INFECTION SUSPECTED) - Abnormal; Notable for the following components:   APPearance CLOUDY (*)    Glucose, UA 50 (*)    Hgb urine dipstick LARGE (*)    Protein, ur 30 (*)    Leukocytes,Ua TRACE (*)    Bacteria, UA MANY (*)  All other components within normal limits  CULTURE, BLOOD (ROUTINE X 2)  CULTURE, BLOOD (ROUTINE X 2)  I-STAT CG4 LACTIC ACID, ED  I-STAT CG4 LACTIC ACID, ED    EKG: None  Radiology: No results found.   Procedures   Medications Ordered in the ED  vancomycin  (VANCOREADY) IVPB 2000 mg/400 mL (has no administration in time range)                                    Medical Decision Making Patient here for evaluation of infection to her left hand.  Infection has gotten worse since yesterday when started on oral antibiotics and given an injection of Rocephin   Amount and/or Complexity of Data Reviewed Labs: ordered. Decision-making details documented in ED Course.    Details: Labs ordered reviewed and interpreted white blood cell count of 13.2 Discussion of management or test interpretation with external provider(s): I discussed the patient with [name] Kenner, PA-C.  Hand surgery will perform an I&D tomorrow.  Patient is set up to have an MRI of her hand.  They have requested that medicine admit.  Patient is to be n.p.o. after midnight. I spoke to the internal medicine resident on-call for unassigned patients.  He will admit.  Risk Prescription drug management. Decision regarding hospitalization. Risk Details: Patient is given IV antibiotics.        Final diagnoses:  Cellulitis of finger of left hand    ED Discharge Orders     None          Erminia Mcnew K, PA-C 04/21/24 1321    Ula Prentice SAUNDERS,  MD 04/21/24 1512

## 2024-04-21 NOTE — ED Triage Notes (Signed)
 Patient presents to the office for a recheck on left hand wound. Patient reports redness and pain. Started  doxycycline  yesterday.

## 2024-04-21 NOTE — ED Notes (Signed)
 Patient is being discharged from the Urgent Care and sent to the Emergency Department via personal opperated vehicle . Per [place]  Dreama NP, patient is in need of higher level of care due to wound check. Patient is aware and verbalizes understanding of plan of care.  Vitals:   04/21/24 0906 04/21/24 0911  BP:  129/85  Pulse: 99   Resp: 18   Temp: 99.1 F (37.3 C)   SpO2: 95%

## 2024-04-21 NOTE — ED Provider Notes (Signed)
 [facility]    CSN: [id number] Arrival date & time: 04/21/24  9178      History   Chief Complaint No chief complaint on file.   HPI Zoe Snyder is a 68 y.o. female.   Patient presents to clinic over concern of worsening infection to the left hand.  She has taken 2 doses of the doxycycline , picked it up around 3 or 4 in the afternoon yesterday.  Did get a IM injection of Rocephin  here in clinic and was told to return in 24 hours for recheck, she may need IV antibiotics.  The pain and swelling of gotten worse, now the area is leaking a bloody purulent drainage.  She is unable to use her hand due to the pain and swelling.  Feels like the redness and warmth have extended up to her mid forearm, a progression from yesterday.  The history is provided by medical records and the patient.    Past Medical History:  Diagnosis Date   COPD (chronic obstructive pulmonary disease) (HCC)    Hashimoto's thyroiditis    History of alcohol abuse 11/30/2017   History of cocaine abuse (HCC) 11/30/2017   History of colon polyps    Hyperlipidemia    Hypertension    Prediabetes    Throat cancer (HCC)    Tobacco use disorder     Patient Active Problem List   Diagnosis Date Noted   Critical lower limb ischemia (HCC) 11/18/2023   Acute respiratory failure with hypoxia (HCC) 09/29/2022   Influenza due to influenza A virus 09/29/2022   COPD with acute exacerbation (HCC) 12/08/2017   Heavy tobacco smoker >10 cigarettes per day 12/08/2017   Unintended weight loss 12/08/2017   Mixed hyperlipidemia 12/08/2017   Hypertension goal BP (blood pressure) < 130/80 12/08/2017   Prediabetes 12/08/2017   Throat cancer (HCC) 12/08/2017   History of cocaine abuse (HCC) 11/30/2017   History of alcohol abuse 11/30/2017   Refused influenza vaccine 11/29/2017   History of colon polyps    Olecranon bursitis of right elbow 02/03/2017   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   Subclinical hypothyroidism  05/06/2016   Muscle cramps 04/29/2016   Malignant neoplasm of larynx (HCC) 01/09/2016   Dysphonia 03/21/2015   Displaced comminuted fracture of shaft of right fibula, sequela 03/07/2015   Anxiety disorder 01/16/2014   Obesity 10/19/2012   Dry eye syndrome 06/10/2009   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   Vitamin D  deficiency 05/28/2009   Sleep apnea 05/07/2008   History of hepatitis C 01/02/2003   Dyspnea 01/02/2003    Past Surgical History:  Procedure Laterality Date   ANGIOPLASTY  11/18/2023   Procedure: LEFT COMMON ILIAC ANGIOPLASTY USING VIABAHN 7 MM X AND 7 MM X 59 MM STENTS;  Surgeon: Magda Debby SAILOR, MD;  Location: [facility] OR;  Service: Vascular;;   AORTOGRAM  11/18/2023   Procedure: AORTOGRAM;  Surgeon: Magda Debby SAILOR, MD;  Location: [facility] OR;  Service: Vascular;;   COLONOSCOPY W/ POLYPECTOMY  2016   GROIN EXPOSURE Left 11/18/2023   Procedure: LEFT GROIN AND FEMORAL ARTERY EXPOSURE;  Surgeon: Magda Debby SAILOR, MD;  Location: [facility] OR;  Service: Vascular;  Laterality: Left;   THROAT SURGERY  2018   cancer   ULTRASOUND GUIDANCE FOR VASCULAR ACCESS Right 11/18/2023   Procedure: ULTRASOUND GUIDANCE FOR VASCULAR ACCESS;  Surgeon: Magda Debby SAILOR, MD;  Location: [facility] OR;  Service: Vascular;  Laterality: Right;    OB History   No obstetric history on file.  Home Medications    Prior to Admission medications   Medication Sig Start Date End Date Taking? Authorizing Provider  albuterol  (PROVENTIL ) (2.5 MG/3ML) 0.083% nebulizer solution Inhale 3 mLs (2.5 mg total) by nebulization every 6 (six) hours as needed for wheezing or shortness of breath. 03/16/23  Yes Arloa Suzen RAMAN, NP  albuterol  (VENTOLIN  HFA) 108 (90 Base) MCG/ACT inhaler Inhale 2 puffs into the lungs every 4 (four) hours as needed for wheezing or shortness of breath. 03/16/23  Yes Arloa Suzen RAMAN, NP  amLODipine  (NORVASC ) 10 MG tablet Take 1 tablet (10 mg total) by mouth daily. Due for follow up visit 08/05/21  Yes Freddi Hamilton,  MD  apixaban  (ELIQUIS ) 5 MG TABS tablet Take 1 tablet (5 mg total) by mouth 2 (two) times daily. 03/28/24  Yes Magda Debby SAILOR, MD  aspirin  EC 81 MG tablet Take 1 tablet (81 mg total) by mouth daily at 6 (six) AM. Swallow whole. 11/20/23  Yes Bethanie Cough, PA-C  atorvastatin  (LIPITOR) 20 MG tablet Take 20 mg by mouth at bedtime.   Yes [provider]  Cholecalciferol  (VITAMIN D -1000 MAX ST) 25 MCG (1000 UT) tablet Take 1 tablet (1,000 Units total) by mouth daily. 08/13/23  Yes Flint Raring K, PA-C  clopidogrel  (PLAVIX ) 75 MG tablet Take 1 tablet (75 mg total) by mouth daily. 12/03/23  Yes Baglia, Corrina, PA-C  doxycycline  (VIBRAMYCIN ) 100 MG capsule Take 1 capsule (100 mg total) by mouth 2 (two) times daily. 04/20/24  Yes Flint Raring K, PA-C  famotidine  (PEPCID ) 20 MG tablet Take 1 tablet (20 mg total) by mouth daily. 04/13/24  Yes Bauer, Collin S, PA-C  fluticasone  furoate-vilanterol (BREO ELLIPTA ) 100-25 MCG/ACT AEPB Inhale 1 puff into the lungs daily. 10/01/22  Yes Rudy Sieving, MD  gabapentin  (NEURONTIN ) 300 MG capsule Take 1 capsule (300 mg total) by mouth 3 (three) times daily as needed for up to 21 doses. 02/07/24  Yes Trifan, Cough PARAS, MD  ipratropium-albuterol  (DUONEB) 0.5-2.5 (3) MG/3ML SOLN Take 3 mLs by nebulization every 4 (four) hours as needed. 10/22/23  Yes Joesph Shaver Scales, PA-C  levothyroxine  (SYNTHROID ) 75 MCG tablet Take 75 mcg by mouth every morning. 06/21/23  Yes [provider]  Multiple Vitamin (MULTI-VITAMIN) tablet Take 1 tablet by mouth daily. 05/08/16  Yes [provider]  pantoprazole  (PROTONIX ) 20 MG tablet Take 1 tablet (20 mg total) by mouth daily. 04/13/24  Yes Bauer, Collin S, PA-C  promethazine -dextromethorphan (PROMETHAZINE -DM) 6.25-15 MG/5ML syrup Take 5 mLs by mouth at bedtime as needed for cough.   Yes [provider]  umeclidinium bromide  (INCRUSE ELLIPTA ) 62.5 MCG/ACT AEPB Inhale 1 puff into the lungs daily. 10/02/22  Yes  Guilloud, Carolyn, MD  valsartan (DIOVAN) 80 MG tablet Take 80 mg by mouth daily. 09/29/22  Yes [provider]  methylPREDNISolone  (MEDROL ) 4 MG tablet Take 4 mg by mouth See admin instructions. Take 6 tablets on day 1 Take 5 tablets on day 2 Take 4 tablets on day 3  Take 3 tablets on day 4 Take 2 tablets on day 5 Take 1 tablet on day 6 Patient not taking: Reported on 11/19/2023 10/22/23   [provider]  oxyCODONE  (OXY IR/ROXICODONE ) 5 MG immediate release tablet Take 1 tablet (5 mg total) by mouth every 6 (six) hours as needed for moderate pain (pain score 4-6). Patient not taking: Reported on 12/28/2023 11/19/23   Bethanie Cough, PA-C  predniSONE  (DELTASONE ) 20 MG tablet Take 40 mg by mouth daily with breakfast.  For 5 days. Patient not taking: Reported on 11/19/2023    [provider]    Family History Family History  Problem Relation Age of Onset   Hypertension Mother    Diabetes Maternal Grandmother    Colon cancer Maternal Aunt     Social History Social History   Tobacco Use   Smoking status: Every Day    Current packs/day: 0.50    Average packs/day: 0.5 packs/day for 45.0 years (22.5 ttl pk-yrs)    Types: Cigarettes   Smokeless tobacco: Never   Tobacco comments:    currently smoking 0.5 ppd, but she has cut down from over 1 ppd  Vaping Use   Vaping status: Never Used  Substance Use Topics   Alcohol use: Yes   Drug use: Not Currently    Types: Marijuana     Allergies   Epinephrine, Novocain [procaine], and Theraflu severe cold daytime [dm-phenylephrine -acetaminophen ]   Review of Systems Review of Systems  Per HPI  Physical Exam Triage Vital Signs ED Triage Vitals  Encounter Vitals Group     BP 04/21/24 0911 129/85     Girls Systolic BP Percentile --      Girls Diastolic BP Percentile --      Boys Systolic BP Percentile --      Boys Diastolic BP Percentile --      Pulse Rate 04/21/24 0906 99     Resp 04/21/24 0906 18     Temp  04/21/24 0906 99.1 F (37.3 C)     Temp Source 04/21/24 0906 Oral     SpO2 04/21/24 0906 95 %     Weight --      Height --      Head Circumference --      Peak Flow --      Pain Score 04/21/24 0910 8     Pain Loc --      Pain Education --      Exclude from Growth Chart --    No data found.  Updated Vital Signs BP 129/85   Pulse 99   Temp 99.1 F (37.3 C) (Oral)   Resp 18   SpO2 95%   Visual Acuity Right Eye Distance:   Left Eye Distance:   Bilateral Distance:    Right Eye Near:   Left Eye Near:    Bilateral Near:     Physical Exam Vitals and nursing note reviewed.  Constitutional:      Appearance: Normal appearance.  HENT:     Head: Normocephalic and atraumatic.     Right Ear: External ear normal.     Left Ear: External ear normal.     Nose: Nose normal.     Mouth/Throat:     Mouth: Mucous membranes are moist.  Eyes:     Conjunctiva/sclera: Conjunctivae normal.  Cardiovascular:     Rate and Rhythm: Normal rate.  Pulmonary:     Effort: Pulmonary effort is normal. No respiratory distress.  Musculoskeletal:        General: Swelling and tenderness present. No signs of injury.  Skin:    General: Skin is warm and dry.     Capillary Refill: Capillary refill takes less than 2 seconds.     Findings: Erythema and wound present.      Neurological:     General: No focal deficit present.     Mental Status: She is alert and oriented to person, place, and time.  Psychiatric:        Mood  and Affect: Mood normal.        Behavior: Behavior normal. Behavior is cooperative.      Media Information    UC Treatments / Results  Labs (all labs ordered are listed, but only abnormal results are displayed) Labs Reviewed - No data to display  EKG   Radiology No results found.  Procedures Procedures (including critical care time)  Medications Ordered in UC Medications - No data to display  Initial Impression / Assessment and Plan / UC Course  I have reviewed  the triage vital signs and the nursing notes.  Pertinent labs & imaging results that were available during my care of the patient were reviewed by me and considered in my medical decision making (see chart for details).  Vitals in triage reviewed, patient is hemodynamically stable.  Presents to clinic for wound recheck, wound has been evolving over the past 24 hours.  Pain, swelling and redness have increased. Concern for deeper infection, advised to head to ED for advanced evaluation and potentially IV abx. Patient in agreement, will transport via POV.      Final Clinical Impressions(s) / UC Diagnoses   Final diagnoses:  Cellulitis of left hand     Discharge Instructions      Please head to [facility] ED for further evaluation of your left hand infection, as you may need IV antibiotics.     ED Prescriptions   None    PDMP not reviewed this encounter.   Dreama, Anshu Wehner  N, FNP 04/21/24 [phone]

## 2024-04-21 NOTE — ED Notes (Signed)
 Patient transported to X-ray

## 2024-04-22 ENCOUNTER — Inpatient Hospital Stay (HOSPITAL_COMMUNITY): Admitting: Certified Registered"

## 2024-04-22 ENCOUNTER — Encounter (HOSPITAL_COMMUNITY): Payer: Self-pay | Admitting: Internal Medicine

## 2024-04-22 ENCOUNTER — Encounter (HOSPITAL_COMMUNITY)
Admission: EM | Disposition: A | Discharge status: Home / Self Care | Payer: Self-pay | Source: Ambulatory Visit | Attending: Internal Medicine

## 2024-04-22 ENCOUNTER — Other Ambulatory Visit: Payer: Self-pay

## 2024-04-22 DIAGNOSIS — E039 Hypothyroidism, unspecified: Secondary | ICD-10-CM | POA: Diagnosis not present

## 2024-04-22 DIAGNOSIS — M65142 Other infective (teno)synovitis, left hand: Secondary | ICD-10-CM

## 2024-04-22 DIAGNOSIS — L03114 Cellulitis of left upper limb: Secondary | ICD-10-CM

## 2024-04-22 DIAGNOSIS — I10 Essential (primary) hypertension: Secondary | ICD-10-CM

## 2024-04-22 DIAGNOSIS — J449 Chronic obstructive pulmonary disease, unspecified: Secondary | ICD-10-CM

## 2024-04-22 LAB — CBC
HCT: 36 % (ref 36.0–46.0)
Hemoglobin: 11.8 g/dL — ABNORMAL LOW (ref 12.0–15.0)
MCH: 30.5 pg (ref 26.0–34.0)
MCHC: 32.8 g/dL (ref 30.0–36.0)
MCV: 93 fL (ref 80.0–100.0)
Platelets: 211 K/uL (ref 150–400)
RBC: 3.87 MIL/uL (ref 3.87–5.11)
RDW: 13.6 % (ref 11.5–15.5)
WBC: 9.9 K/uL (ref 4.0–10.5)
nRBC: 0 % (ref 0.0–0.2)

## 2024-04-22 LAB — BASIC METABOLIC PANEL WITH GFR
Anion gap: 8 (ref 5–15)
BUN: 25 mg/dL — ABNORMAL HIGH (ref 8–23)
CO2: 21 mmol/L — ABNORMAL LOW (ref 22–32)
Calcium: 9.2 mg/dL (ref 8.9–10.3)
Chloride: 107 mmol/L (ref 98–111)
Creatinine, Ser: 1.22 mg/dL — ABNORMAL HIGH (ref 0.44–1.00)
GFR, Estimated: 49 mL/min — ABNORMAL LOW (ref >60)
Glucose, Bld: 146 mg/dL — ABNORMAL HIGH (ref 70–99)
Potassium: 5.2 mmol/L — ABNORMAL HIGH (ref 3.5–5.1)
Sodium: 136 mmol/L (ref 135–145)

## 2024-04-22 LAB — SURGICAL PCR SCREEN
MRSA, PCR: NEGATIVE
Staphylococcus aureus: NEGATIVE

## 2024-04-22 SURGERY — IRRIGATION AND DEBRIDEMENT WOUND
Anesthesia: General | Laterality: Left

## 2024-04-22 MED ORDER — PHENYLEPHRINE 80 MCG/ML (10ML) SYRINGE FOR IV PUSH (FOR BLOOD PRESSURE SUPPORT)
PREFILLED_SYRINGE | INTRAVENOUS | Status: AC
Start: 2024-04-22 — End: 2024-04-22
  Filled 2024-04-22: qty 10

## 2024-04-22 MED ORDER — HYDROMORPHONE HCL 1 MG/ML IJ SOLN
INTRAMUSCULAR | Status: DC | PRN
  Administered 2024-04-22 (×2): .25 mg via INTRAVENOUS

## 2024-04-22 MED ORDER — VANCOMYCIN HCL 750 MG/150ML IV SOLN
750.0000 mg | INTRAVENOUS | Status: DC
  Administered 2024-04-22: 750 mg via INTRAVENOUS
  Filled 2024-04-22 (×2): qty 150

## 2024-04-22 MED ORDER — APIXABAN 5 MG PO TABS
5.0000 mg | ORAL_TABLET | Freq: Two times a day (BID) | ORAL | Status: DC
  Administered 2024-04-22 – 2024-04-24 (×4): 5 mg via ORAL
  Filled 2024-04-22 (×4): qty 1

## 2024-04-22 MED ORDER — BUPIVACAINE HCL (PF) 0.25 % IJ SOLN
INTRAMUSCULAR | Status: AC
  Filled 2024-04-22: qty 30

## 2024-04-22 MED ORDER — ONDANSETRON HCL 4 MG/2ML IJ SOLN
INTRAMUSCULAR | Status: DC | PRN
  Administered 2024-04-22: 4 mg via INTRAVENOUS

## 2024-04-22 MED ORDER — HYDROMORPHONE HCL 1 MG/ML IJ SOLN
INTRAMUSCULAR | Status: AC
  Filled 2024-04-22: qty 0.5

## 2024-04-22 MED ORDER — FENTANYL CITRATE (PF) 250 MCG/5ML IJ SOLN
INTRAMUSCULAR | Status: AC
  Filled 2024-04-22: qty 5

## 2024-04-22 MED ORDER — FENTANYL CITRATE (PF) 100 MCG/2ML IJ SOLN
25.0000 ug | INTRAMUSCULAR | Status: DC | PRN
  Administered 2024-04-22: 50 ug via INTRAVENOUS
  Administered 2024-04-22: 25 ug via INTRAVENOUS

## 2024-04-22 MED ORDER — CEFAZOLIN SODIUM-DEXTROSE 2-4 GM/100ML-% IV SOLN: 2.0000 g | INTRAVENOUS | Status: DC

## 2024-04-22 MED ORDER — CHLORHEXIDINE GLUCONATE 0.12 % MT SOLN
OROMUCOSAL | Status: AC
  Filled 2024-04-22: qty 15

## 2024-04-22 MED ORDER — OXYCODONE HCL 5 MG PO TABS: 5.0000 mg | ORAL_TABLET | Freq: Once | ORAL | Status: DC | PRN

## 2024-04-22 MED ORDER — CLOPIDOGREL BISULFATE 75 MG PO TABS
75.0000 mg | ORAL_TABLET | Freq: Every day | ORAL | Status: DC
  Administered 2024-04-22 – 2024-04-24 (×3): 75 mg via ORAL
  Filled 2024-04-22 (×3): qty 1

## 2024-04-22 MED ORDER — LIDOCAINE 2% (20 MG/ML) 5 ML SYRINGE
INTRAMUSCULAR | Status: AC
  Filled 2024-04-22: qty 5

## 2024-04-22 MED ORDER — ONDANSETRON HCL 4 MG/2ML IJ SOLN
INTRAMUSCULAR | Status: AC
Start: 2024-04-22 — End: 2024-04-22
  Filled 2024-04-22: qty 2

## 2024-04-22 MED ORDER — CHLORHEXIDINE GLUCONATE 4 % EX SOLN
60.0000 mL | Freq: Once | CUTANEOUS | Status: AC
  Administered 2024-04-22: 4 via TOPICAL
  Filled 2024-04-22: qty 60

## 2024-04-22 MED ORDER — CEFAZOLIN SODIUM-DEXTROSE 2-4 GM/100ML-% IV SOLN
INTRAVENOUS | Status: AC
  Filled 2024-04-22: qty 100

## 2024-04-22 MED ORDER — ROCURONIUM BROMIDE 10 MG/ML (PF) SYRINGE
PREFILLED_SYRINGE | INTRAVENOUS | Status: AC
  Filled 2024-04-22: qty 10

## 2024-04-22 MED ORDER — PROPOFOL 10 MG/ML IV BOLUS
INTRAVENOUS | Status: AC
  Filled 2024-04-22: qty 20

## 2024-04-22 MED ORDER — DEXAMETHASONE SODIUM PHOSPHATE 10 MG/ML IJ SOLN
INTRAMUSCULAR | Status: DC | PRN
  Administered 2024-04-22: 4 mg via INTRAVENOUS

## 2024-04-22 MED ORDER — SUCCINYLCHOLINE CHLORIDE 200 MG/10ML IV SOSY
PREFILLED_SYRINGE | INTRAVENOUS | Status: AC
  Filled 2024-04-22: qty 10

## 2024-04-22 MED ORDER — LACTATED RINGERS IV SOLN: INTRAVENOUS | Status: DC

## 2024-04-22 MED ORDER — OXYCODONE HCL 5 MG/5ML PO SOLN: 5.0000 mg | Freq: Once | ORAL | Status: DC | PRN

## 2024-04-22 MED ORDER — ORAL CARE MOUTH RINSE: 15.0000 mL | Freq: Once | OROMUCOSAL | Status: AC

## 2024-04-22 MED ORDER — CEFAZOLIN SODIUM-DEXTROSE 2-3 GM-%(50ML) IV SOLR
INTRAVENOUS | Status: DC | PRN
  Administered 2024-04-22: 2 g via INTRAVENOUS

## 2024-04-22 MED ORDER — FENTANYL CITRATE (PF) 250 MCG/5ML IJ SOLN
INTRAMUSCULAR | Status: DC | PRN
  Administered 2024-04-22: 50 ug via INTRAVENOUS

## 2024-04-22 MED ORDER — DEXAMETHASONE SODIUM PHOSPHATE 10 MG/ML IJ SOLN
INTRAMUSCULAR | Status: AC
  Filled 2024-04-22: qty 1

## 2024-04-22 MED ORDER — ONDANSETRON HCL 4 MG/2ML IJ SOLN: 4.0000 mg | Freq: Once | INTRAMUSCULAR | Status: DC | PRN

## 2024-04-22 MED ORDER — EPHEDRINE 5 MG/ML INJ
INTRAVENOUS | Status: AC
  Filled 2024-04-22: qty 5

## 2024-04-22 MED ORDER — POVIDONE-IODINE 10 % EX SWAB
2.0000 | Freq: Once | CUTANEOUS | Status: AC
  Administered 2024-04-22: 2 via TOPICAL

## 2024-04-22 MED ORDER — PHENYLEPHRINE 80 MCG/ML (10ML) SYRINGE FOR IV PUSH (FOR BLOOD PRESSURE SUPPORT)
PREFILLED_SYRINGE | INTRAVENOUS | Status: DC | PRN
  Administered 2024-04-22: 80 ug via INTRAVENOUS
  Administered 2024-04-22: 160 ug via INTRAVENOUS
  Administered 2024-04-22 (×2): 80 ug via INTRAVENOUS

## 2024-04-22 MED ORDER — CHLORHEXIDINE GLUCONATE 0.12 % MT SOLN
15.0000 mL | Freq: Once | OROMUCOSAL | Status: AC
  Administered 2024-04-22: 15 mL via OROMUCOSAL

## 2024-04-22 MED ORDER — FENTANYL CITRATE (PF) 100 MCG/2ML IJ SOLN
INTRAMUSCULAR | Status: AC
  Filled 2024-04-22: qty 2

## 2024-04-22 MED ORDER — 0.9 % SODIUM CHLORIDE (POUR BTL) OPTIME
TOPICAL | Status: DC | PRN
  Administered 2024-04-22: 1000 mL

## 2024-04-22 MED ORDER — PROPOFOL 10 MG/ML IV BOLUS
INTRAVENOUS | Status: DC | PRN
  Administered 2024-04-22: 160 mg via INTRAVENOUS

## 2024-04-22 SURGICAL SUPPLY — 55 items
BAG COUNTER SPONGE SURGICOUNT (BAG) ×1 IMPLANT
BNDG COMPR ESMARK 4X3 LF (GAUZE/BANDAGES/DRESSINGS) IMPLANT
BNDG ELASTIC 2INX 5YD STR LF (GAUZE/BANDAGES/DRESSINGS) IMPLANT
BNDG ELASTIC 2X5.8 VLCR STR LF (GAUZE/BANDAGES/DRESSINGS) ×1 IMPLANT
BNDG ELASTIC 3INX 5YD STR LF (GAUZE/BANDAGES/DRESSINGS) ×1 IMPLANT
BNDG ELASTIC 4X5.8 VLCR STR LF (GAUZE/BANDAGES/DRESSINGS) ×1 IMPLANT
BNDG GAUZE DERMACEA FLUFF 4 (GAUZE/BANDAGES/DRESSINGS) ×2 IMPLANT
CHLORAPREP W/TINT 26 (MISCELLANEOUS) ×1 IMPLANT
CORD BIPOLAR FORCEPS 12FT (ELECTRODE) ×1 IMPLANT
COVER BACK TABLE 60X90IN (DRAPES) IMPLANT
COVER MAYO STAND STRL (DRAPES) ×1 IMPLANT
COVER SURGICAL LIGHT HANDLE (MISCELLANEOUS) ×1 IMPLANT
CUFF TOURN SGL QUICK 18X4 (TOURNIQUET CUFF) ×1 IMPLANT
CUFF TRNQT CYL 24X4X16.5-23 (TOURNIQUET CUFF) IMPLANT
DRAIN PENROSE 18X1/4 LTX STRL (DRAIN) IMPLANT
DRAPE HALF SHEET 40X57 (DRAPES) ×1 IMPLANT
DRAPE OEC MINIVIEW 54X84 (DRAPES) IMPLANT
DRAPE SURG 17X23 STRL (DRAPES) ×1 IMPLANT
DRSG ADAPTIC 3X8 NADH LF (GAUZE/BANDAGES/DRESSINGS) ×1 IMPLANT
GAUZE SPONGE 4X4 12PLY STRL (GAUZE/BANDAGES/DRESSINGS) ×1 IMPLANT
GAUZE STRETCH 2X75IN STRL (MISCELLANEOUS) IMPLANT
GAUZE XEROFORM 1X8 LF (GAUZE/BANDAGES/DRESSINGS) ×1 IMPLANT
GLOVE BIO SURGEON STRL SZ7 (GLOVE) ×1 IMPLANT
GLOVE BIOGEL PI IND STRL 7.0 (GLOVE) ×1 IMPLANT
GOWN STRL REUS W/ TWL XL LVL3 (GOWN DISPOSABLE) ×2 IMPLANT
IV CATH 18G X1.75 CATHLON (IV SOLUTION) IMPLANT
KIT BASIN OR (CUSTOM PROCEDURE TRAY) ×1 IMPLANT
KIT TURNOVER KIT B (KITS) ×1 IMPLANT
LOOP VASCLR MAXI BLUE 18IN ST (MISCELLANEOUS) IMPLANT
LOOPS VASCLR MAXI BLUE 18IN ST (MISCELLANEOUS) IMPLANT
MANIFOLD NEPTUNE II (INSTRUMENTS) IMPLANT
NDL HYPO 25GX1X1/2 BEV (NEEDLE) IMPLANT
NDL HYPO 25X1 1.5 SAFETY (NEEDLE) ×1 IMPLANT
NDL KEITH (NEEDLE) IMPLANT
NEEDLE HYPO 25GX1X1/2 BEV (NEEDLE) IMPLANT
NEEDLE HYPO 25X1 1.5 SAFETY (NEEDLE) ×1 IMPLANT
NEEDLE KEITH (NEEDLE) IMPLANT
NS IRRIG 1000ML POUR BTL (IV SOLUTION) ×1 IMPLANT
PACK ORTHO EXTREMITY (CUSTOM PROCEDURE TRAY) ×1 IMPLANT
PAD ABD 8X10 STRL (GAUZE/BANDAGES/DRESSINGS) ×1 IMPLANT
PAD ARMBOARD POSITIONER FOAM (MISCELLANEOUS) ×1 IMPLANT
PAD CAST 4YDX4 CTTN HI CHSV (CAST SUPPLIES) ×2 IMPLANT
PADDING CAST ABS COTTON 3X4 (CAST SUPPLIES) ×1 IMPLANT
SPLINT PLASTER CAST XFAST 3X15 (CAST SUPPLIES) ×1 IMPLANT
SPONGE T-LAP 4X18 ~~LOC~~+RFID (SPONGE) ×1 IMPLANT
SUTURE FIBERWR 3-0 18 TAPR NDL (SUTURE) IMPLANT
SWAB CULTURE ESWAB REG 1ML (MISCELLANEOUS) IMPLANT
SYR BULB EAR ULCER 3OZ GRN STR (SYRINGE) ×1 IMPLANT
SYR CONTROL 10ML LL (SYRINGE) IMPLANT
TOWEL GREEN STERILE (TOWEL DISPOSABLE) ×1 IMPLANT
TOWEL GREEN STERILE FF (TOWEL DISPOSABLE) ×2 IMPLANT
TUBE CONNECTING 12X1/4 (SUCTIONS) IMPLANT
UNDERPAD 30X36 HEAVY ABSORB (UNDERPADS AND DIAPERS) ×1 IMPLANT
WATER STERILE IRR 1000ML POUR (IV SOLUTION) ×1 IMPLANT
YANKAUER SUCT BULB TIP NO VENT (SUCTIONS) IMPLANT

## 2024-04-22 NOTE — Plan of Care (Signed)

## 2024-04-22 NOTE — Progress Notes (Signed)
 Pharmacy Antibiotic Note  Zoe Snyder is a 68 y.o. female admitted on 04/21/2024 with cellulitis.  Pharmacy has been consulted for vancomycin  dosing. Pt with AKI. Renal function is improving. Pt s/p I&D 7/12.   Plan: Adjust vancomycin  to 750 mg IV every 24 hours. Goal AUC 400-550 (Scr 1.22, Vd 0.5, eAUC 502.6) Monitor renal function and clinical picture daily. Continue to follow up on cultures and sensitivities. Monitor levels as appropriate.   Height: 5' 2.99 (160 cm) Weight: 85 kg (187 lb 6.3 oz) IBW/kg (Calculated) : 52.38  Temp (24hrs), Avg:97.9 F (36.6 C), Min:97.7 F (36.5 C), Max:98.2 F (36.8 C)  Recent Labs  Lab 04/21/24 1002 04/21/24 1016 04/21/24 1417 04/22/24 1049  WBC 13.2*  --   --  9.9  CREATININE 1.53*  --   --  1.22*  LATICACIDVEN  --  1.1 1.0  --     Estimated Creatinine Clearance: 46.2 mL/min (A) (by C-G formula based on SCr of 1.22 mg/dL (H)).    Allergies  Allergen Reactions   Epinephrine Anaphylaxis    Respiratory problems, e.g., wheezing;  Palpitations   Palpitations  Respiratory problems, e.g., wheezing;, Palpitations   Novocain [Procaine] Palpitations and Other (See Comments)    Heart racing   Theraflu Severe Cold Daytime [Dm-Phenylephrine -Acetaminophen ] Palpitations    Antimicrobials this admission: 7/11 Vancomycin  >> 7/12 Cefazolin  2g IV x1   Dose adjustments this admission: 7/12 Vancomycin    Microbiology results: 7/11 BCx: ngtd < 24h 7/4 UCx: no growth  7/11 MRSA PCR: neg 7/12 Fluid cx: ngtd  Thank you for allowing pharmacy to be a part of this patient's care.  [name], PharmD PGY1 Clinical Pharmacist [facility]  04/22/2024 3:25 PM  04/22/2024 3:12 PM

## 2024-04-22 NOTE — Interval H&P Note (Signed)
 History and Physical Interval Note:  04/22/2024 8:23 AM  Zoe Snyder  has presented today for surgery, with the diagnosis of Left Hand Laceration.  The various methods of treatment have been discussed with the patient and family. After consideration of risks, benefits and other options for treatment, the patient has consented to  Procedure(s) with comments: IRRIGATION AND DEBRIDEMENT WOUND (Left) - Left Hand Possible Forearm as a surgical intervention.  The patient's history has been reviewed, patient examined, no change in status, stable for surgery.  I have reviewed the patient's chart and labs.  Questions were answered to the patient's satisfaction.     Lin Glazier

## 2024-04-22 NOTE — Progress Notes (Cosign Needed)
 HD#2 SUBJECTIVE:  Patient Summary: Zoe Snyder is a 68 y.o. with a pertinent PMH of PAD with critical limb ischemia and HTN, who presented with swelling and erythema in the LUE and admitted for LUE cellulitis.   Overnight Events: No acute events overnight  Interim History: Pt seen and evaluated at the bedside this morning. She reports that her hand is feeling better and that she still has redness up to the elbow which is also painful with palpation. She is concerned about her home medications this morning as they were held for surgery. All pts questions and concerns were addressed at this time.   OBJECTIVE:  Vital Signs: Vitals:   04/23/24 0007 04/23/24 0533 04/23/24 0742 04/23/24 1624  BP: (!) 160/88 (!) 154/79 (!) 160/70 (!) 148/69  Pulse: 72 75 72 73  Resp: 20 17 16 17   Temp: 97.8 F (36.6 C) 97.8 F (36.6 C) 98 F (36.7 C) 98 F (36.7 C)  TempSrc: Oral Oral Oral Oral  SpO2: 94% 96% 98% 99%  Weight:      Height:       Supplemental O2: Room Air SpO2: 99 %  Filed Weights   04/21/24 0957 04/22/24 0405 04/22/24 0718  Weight: 81.6 kg 85 kg 85 kg     Intake/Output Summary (Last 24 hours) at 04/23/2024 1743 Last data filed at 04/23/2024 1636 Gross per 24 hour  Intake 908.34 ml  Output --  Net 908.34 ml   Net IO Since Admission: 1,643.34 mL [04/23/24 1743]  Physical Exam: Const: Awake, alert in NAD HENT: Normocephalic, atraumatic, mucus membranes moist Card: No pitting edema on LE's bilaterally  Resp: no increased work of breathing Abd: Soft, NTND Extremities: Warm, pink, Bandage in place on Left hand. Noticeable erythematous streaking up the distal left upper extremity to the elbow. Tender to palpation.     Patient Lines/Drains/Airways Status     Active Line/Drains/Airways     Name Placement date Placement time Site Days   Peripheral IV 04/21/24 20 G 1.88 Anterior;Right Forearm 04/21/24  1332  Forearm  1   Open Drain Left;Dorsal Hand  04/22/24  0852   Hand  less than 1   Wound 04/22/24 0710 Surgical Closed Surgical Incision Hand Left 04/22/24  0710  Hand  less than 1            Pertinent labs and imaging:      Latest Ref Rng & Units 04/23/2024    9:12 AM 04/22/2024   10:49 AM 04/21/2024   10:02 AM  CBC  WBC 4.0 - 10.5 K/uL 15.3  9.9  13.2   Hemoglobin 12.0 - 15.0 g/dL 88.8  88.1  87.4   Hematocrit 36.0 - 46.0 % 33.0  36.0  38.1   Platelets 150 - 400 K/uL 243  211  239        Latest Ref Rng & Units 04/23/2024    9:12 AM 04/22/2024   10:49 AM 04/21/2024   10:02 AM  CMP  Glucose 70 - 99 mg/dL 731  853  817   BUN 8 - 23 mg/dL 29  25  22    Creatinine 0.44 - 1.00 mg/dL 8.91  8.77  8.46   Sodium 135 - 145 mmol/L 135  136  138   Potassium 3.5 - 5.1 mmol/L 4.0  5.2  3.9   Chloride 98 - 111 mmol/L 106  107  106   CO2 22 - 32 mmol/L 19  21  20    Calcium  8.9 -  10.3 mg/dL 9.2  9.2  9.4   Total Protein 6.5 - 8.1 g/dL   6.9   Total Bilirubin 0.0 - 1.2 mg/dL   0.7   Alkaline Phos 38 - 126 U/L   70   AST 15 - 41 U/L   11   ALT 0 - 44 U/L   13     No results found.   ASSESSMENT/PLAN:  Assessment: Principal Problem:   Abscess of dorsum of left hand Active Problems:   Cellulitis of left hand   AKI (acute kidney injury) (HCC)   Hypercoagulable state (HCC)   PAD (peripheral artery disease) (HCC)   History of DVT of lower extremity   Plan: #LUE cellulitis Pt presented with LUE cellulitis on 7/11. She is unsure of the inciting event, but thinks it may have been a bug bite. MRI showed a small abscess on the dorsal surface of the hand. Ortho follow and I&D performed this morning with cultures sent in the OR.  -Continue vancomycin  -Will follow ortho's recommendation for wound care -Restart eliquis  tonight.   #CKD vs AKI Questionable AKI on admission. Creatinine has been elevated since vascular event earlier this year. Cr 1.53 today. Will continue to monitor and hold valsartan for now.    #PAD Hypercoagulable state VTE even  with critical limb ischemia in February of this year. She is on eliquis  and plavix  at home. Will restart this evening with approval form orthopedics.   Best Practice: Diet: Regular diet IVF: Fluids: none, Rate: None VTE: Eliquis  Code: Full  Disposition planning: Therapy Recs: None,  Family Contact: Marshall Makua, to be notified. DISPO: Anticipated discharge pending to Home pending Clinical improvemnt.  Signature:  Mliss Arlean Pouch, DO [facility]  5:43 PM, 04/23/2024  On Call pager [phone]

## 2024-04-22 NOTE — Plan of Care (Signed)

## 2024-04-22 NOTE — Op Note (Signed)
   Date of Surgery: 04/22/2024  INDICATIONS: Patient is a 68 y.o.-year-old female with an infection involving the dorsum of the left hand.  Risks, benefits, and alternatives to surgery were again discussed with the patient in the preoperative area. The patient wishes to proceed with surgery.  Informed consent was signed after our discussion.   PREOPERATIVE DIAGNOSIS:  Left hand abscess  POSTOPERATIVE DIAGNOSIS:  Left dorsal hand abscess Infectious tenosynovitis of extensor digitorum to middle finger  PROCEDURE:  Irrigation and drainage of dorsal hand abscess, complex Debridement of nonviable skin and subcutaneous tissue at site of open wound on dorsum of hand, approx 3 cm Extensor tenosynovectomy of middle finger extensor digitorum communis tendon   SURGEON: Carlin Galla, M.D.  ASSIST:   ANESTHESIA:  general  IV FLUIDS AND URINE: See anesthesia.  ESTIMATED BLOOD LOSS: <5 mL.  IMPLANTS: * No implants in log *   DRAINS: Penrose x 1  COMPLICATIONS: None noted  DESCRIPTION OF PROCEDURE: The patient was met in the preoperative holding area where the surgical site was marked and the consent form was signed.  The patient was then taken to the operating room and transferred to the operating table.  All bony prominences were well padded.  A tourniquet was applied to the left upper arm.  General Endotracheal anesthesia.  was induced.  The operative extremity was prepped and draped in the usual and sterile fashion.  A formal time-out was performed to confirm that this was the correct patient, surgery, side, and site.   Following formal timeout, the limb was exsanguinated by gravity and the tourniquet inflated to 250 mmHg.  I began by making a longitudinal, 4 cm incision directly centered over this open wound.  The skin was incised.  There was abundant purulence encountered.  Culture swabs  for aerobic and anaerobic specimens were taken.  The skin and subcutaneous tissue at the nonviable  portions of this wound was sharply excised using a pickup and tenotomy scissor.  Blunt dissection was used to identify the underlying extensor digitorum tendon.  There was abundant slimy appearing tenosynovial tissue around this tendon.  This tissue was debrided sharply using a pickup and tenotomy scissor.  The wound was then thoroughly irrigated with copious sterile saline.  It was closed with several 4-0 chromic sutures.  A Penrose drain was then placed in the wound to allow for drainage.  The wound was then cleaned and dressed with Xeroform, folded Kerlix, and an Ace wrap.  The tourniquet was deflated.  The fingers were pink and well-perfused.  The patient was reversed from anesthesia and extubated uneventfully.  They were transferred from the operating table to the postoperative bed.  All counts were correct x 2 at the end of the procedure.  The patient was then taken to the PACU in stable condition.   POSTOPERATIVE PLAN: She will remain admitted to the medicine service.  We will follow-up intraoperative cultures.  She will start wound care tomorrow with warm soaks with diluted Hibiclens  solution.  Carlin Galla, MD 9:12 AM

## 2024-04-22 NOTE — Anesthesia Preprocedure Evaluation (Addendum)
 Anesthesia Evaluation  Patient identified by MRN, date of birth, ID band Patient awake    Reviewed: Allergy & Precautions, NPO status , Patient's Chart, lab work & pertinent test results, reviewed documented beta blocker date and time   History of Anesthesia Complications Negative for: history of anesthetic complications  Airway Mallampati: II  TM Distance: >3 FB     Dental  (+) Poor Dentition   Pulmonary shortness of breath and with exertion, sleep apnea , COPD,  COPD inhaler, Current Smoker and Patient abstained from smoking.   breath sounds clear to auscultation + decreased breath sounds      Cardiovascular hypertension, + Peripheral Vascular Disease  (-) CAD and (-) Past MI  Rhythm:Regular Rate:Normal     Neuro/Psych neg Seizures PSYCHIATRIC DISORDERS Anxiety        GI/Hepatic ,,,(+) Hepatitis -, C  Endo/Other  Hypothyroidism    Renal/GU Renal disease     Musculoskeletal   Abdominal   Peds  Hematology   Anesthesia Other Findings   Reproductive/Obstetrics                              Anesthesia Physical Anesthesia Plan  ASA: 3  Anesthesia Plan: General   Post-op Pain Management:    Induction: Intravenous  PONV Risk Score and Plan: 2 and Ondansetron  and Dexamethasone   Airway Management Planned: LMA  Additional Equipment:   Intra-op Plan:   Post-operative Plan: Extubation in OR  Informed Consent: I have reviewed the patients History and Physical, chart, labs and discussed the procedure including the risks, benefits and alternatives for the proposed anesthesia with the patient or authorized representative who has indicated his/her understanding and acceptance.     Dental advisory given  Plan Discussed with: CRNA  Anesthesia Plan Comments:         Anesthesia Quick Evaluation

## 2024-04-22 NOTE — Plan of Care (Signed)
  Problem: Clinical Measurements: Goal: Will remain free from infection Outcome: Progressing   Problem: Clinical Measurements: Goal: Ability to maintain clinical measurements within normal limits will improve Outcome: Progressing   Problem: Activity: Goal: Risk for activity intolerance will decrease Outcome: Progressing   Problem: Elimination: Goal: Will not experience complications related to bowel motility Outcome: Progressing

## 2024-04-22 NOTE — Anesthesia Procedure Notes (Signed)
 Procedure Name: LMA Insertion Date/Time: 04/22/2024 8:38 AM  Performed by: Bonny Avelina Caldron, CRNAPre-anesthesia Checklist: Patient identified, Suction available, Patient being monitored, Timeout performed and Emergency Drugs available Patient Re-evaluated:Patient Re-evaluated prior to induction Oxygen Delivery Method: Circle system utilized Preoxygenation: Pre-oxygenation with 100% oxygen Induction Type: IV induction LMA: LMA inserted LMA Size: 4.0 Dental Injury: Teeth and Oropharynx as per pre-operative assessment

## 2024-04-22 NOTE — Anesthesia Postprocedure Evaluation (Signed)
 Anesthesia Post Note  Patient: Zoe Snyder  Procedure(s) Performed: IRRIGATION AND DEBRIDEMENT WOUND (Left)     Patient location during evaluation: PACU Anesthesia Type: General Level of consciousness: awake and alert Pain management: pain level controlled Vital Signs Assessment: post-procedure vital signs reviewed and stable Respiratory status: spontaneous breathing, nonlabored ventilation, respiratory function stable and patient connected to nasal cannula oxygen Cardiovascular status: blood pressure returned to baseline and stable Postop Assessment: no apparent nausea or vomiting Anesthetic complications: no   No notable events documented.  Last Vitals:  Vitals:   04/22/24 0945 04/22/24 1006  BP: (!) 133/93 119/70  Pulse: 65 66  Resp: 12 14  Temp: 36.5 C 36.7 C  SpO2: 91% 90%    Last Pain:  Vitals:   04/22/24 1310  TempSrc:   PainSc: 8                  Lynwood MARLA Cornea

## 2024-04-22 NOTE — Transfer of Care (Signed)
 Immediate Anesthesia Transfer of Care Note  Patient: Zoe Snyder  Procedure(s) Performed: IRRIGATION AND DEBRIDEMENT WOUND (Left)  Patient Location: PACU  Anesthesia Type:General  Level of Consciousness: awake, alert , oriented, and patient cooperative  Airway & Oxygen Therapy: Patient Spontanous Breathing  Post-op Assessment: Report given to RN and Post -op Vital signs reviewed and stable  Post vital signs: Reviewed and stable  Last Vitals:  Vitals Value Taken Time  BP 132/71 04/22/24 09:11  Temp 36.6 C 04/22/24 09:10  Pulse 68 04/22/24 09:14  Resp 11 04/22/24 09:14  SpO2 93 % 04/22/24 09:14  Vitals shown include unfiled device data.  Last Pain:  Vitals:   04/22/24 0717  TempSrc: Oral  PainSc:          Complications: No notable events documented.

## 2024-04-23 ENCOUNTER — Encounter (HOSPITAL_COMMUNITY): Payer: Self-pay | Admitting: Orthopedic Surgery

## 2024-04-23 DIAGNOSIS — N189 Chronic kidney disease, unspecified: Secondary | ICD-10-CM

## 2024-04-23 DIAGNOSIS — I739 Peripheral vascular disease, unspecified: Secondary | ICD-10-CM

## 2024-04-23 DIAGNOSIS — L02512 Cutaneous abscess of left hand: Secondary | ICD-10-CM | POA: Diagnosis present

## 2024-04-23 DIAGNOSIS — Z86718 Personal history of other venous thrombosis and embolism: Secondary | ICD-10-CM

## 2024-04-23 DIAGNOSIS — D6859 Other primary thrombophilia: Secondary | ICD-10-CM

## 2024-04-23 DIAGNOSIS — N179 Acute kidney failure, unspecified: Secondary | ICD-10-CM

## 2024-04-23 LAB — BASIC METABOLIC PANEL WITH GFR
Anion gap: 10 (ref 5–15)
BUN: 29 mg/dL — ABNORMAL HIGH (ref 8–23)
CO2: 19 mmol/L — ABNORMAL LOW (ref 22–32)
Calcium: 9.2 mg/dL (ref 8.9–10.3)
Chloride: 106 mmol/L (ref 98–111)
Creatinine, Ser: 1.08 mg/dL — ABNORMAL HIGH (ref 0.44–1.00)
GFR, Estimated: 56 mL/min — ABNORMAL LOW (ref >60)
Glucose, Bld: 268 mg/dL — ABNORMAL HIGH (ref 70–99)
Potassium: 4 mmol/L (ref 3.5–5.1)
Sodium: 135 mmol/L (ref 135–145)

## 2024-04-23 LAB — CBC
HCT: 33 % — ABNORMAL LOW (ref 36.0–46.0)
Hemoglobin: 11.1 g/dL — ABNORMAL LOW (ref 12.0–15.0)
MCH: 30.9 pg (ref 26.0–34.0)
MCHC: 33.6 g/dL (ref 30.0–36.0)
MCV: 91.9 fL (ref 80.0–100.0)
Platelets: 243 K/uL (ref 150–400)
RBC: 3.59 MIL/uL — ABNORMAL LOW (ref 3.87–5.11)
RDW: 13.2 % (ref 11.5–15.5)
WBC: 15.3 K/uL — ABNORMAL HIGH (ref 4.0–10.5)
nRBC: 0 % (ref 0.0–0.2)

## 2024-04-23 MED ORDER — VANCOMYCIN HCL 1750 MG/350ML IV SOLN
1750.0000 mg | INTRAVENOUS | Status: DC
  Administered 2024-04-23: 1750 mg via INTRAVENOUS
  Filled 2024-04-23 (×2): qty 350

## 2024-04-23 NOTE — Progress Notes (Signed)
 Pharmacy Antibiotic Note  Zoe Snyder is a 68 y.o. female admitted on 04/21/2024 with cellulitis.  Pharmacy has been consulted for vancomycin  dosing. Pt with AKI. Renal function is improving. WBC increased from 9.9k to 15.3k, but pt remains afebrile. Pt s/p I&D 7/12.   Plan: Adjust vancomycin  to 1750 mg IV every 24 hours. Goal AUC 400-550 (Scr 1.08 Vd 0.72, eAUC 471.1) Monitor renal function and clinical picture daily. Continue to follow up on cultures and sensitivities. Monitor levels as appropriate.   Height: 5' 2.99 (160 cm) Weight: 85 kg (187 lb 6.3 oz) IBW/kg (Calculated) : 52.38  Temp (24hrs), Avg:97.9 F (36.6 C), Min:97.7 F (36.5 C), Max:98 F (36.7 C)  Recent Labs  Lab 04/21/24 1002 04/21/24 1016 04/21/24 1417 04/22/24 1049 04/23/24 0912  WBC 13.2*  --   --  9.9 15.3*  CREATININE 1.53*  --   --  1.22* 1.08*  LATICACIDVEN  --  1.1 1.0  --   --     Estimated Creatinine Clearance: 52.2 mL/min (A) (by C-G formula based on SCr of 1.08 mg/dL (H)).    Allergies  Allergen Reactions   Epinephrine Anaphylaxis    Respiratory problems, e.g., wheezing;  Palpitations   Palpitations  Respiratory problems, e.g., wheezing;, Palpitations   Novocain [Procaine] Palpitations and Other (See Comments)    Heart racing   Theraflu Severe Cold Daytime [Dm-Phenylephrine -Acetaminophen ] Palpitations    Antimicrobials this admission: 7/11 Vancomycin  >> 7/12 Cefazolin  2g IV x1   Dose adjustments this admission: 7/12 Vancomycin    Microbiology results: 7/11 BCx: ngtd2 7/4 UCx: no growth  7/11 MRSA PCR: neg 7/12 Fluid cx: moderate S.aureus  Thank you for allowing pharmacy to be a part of this patient's care.  [name], PharmD PGY1 Clinical Pharmacist [facility]  04/23/2024 2:46 PM

## 2024-04-23 NOTE — Hospital Course (Signed)
 Below is the hospital course for [place] with admission starting on 04/24/24:   LUE Cellulitis:  Noticed a small blister or pustule with dark appearance to the center that started on 7/08 but was uncertain of inciting events. She had gone to UC at the time and was prescribed doxycycline  but had noted worsening at 24 hour follow up and was referred to emergency room. On admission on 7/11, she was started on IV vancomycin  and an MRI of the left hand was ordered. MRI 7/11 of left hand revealed enhancing subcutaneous edema in the dorusm of the wrist and hand, tracking into the second through fifth fingers, favoring cellulitis. Small focal subcutaneous fluid collection seen measuring 1.1 bt 0.4 by 1.0 cm. Ortho hand consult was placed and I&D scheduled for 7/12.  Patient had I&D of L hand dorsal abscess 7/13 revealing abundant purulence and debridement of nonviable skin and subcutaneous tissue at site of infectious tenosynovitis of extensor digitorum to middle finger. Findings included abundant slimy appearing tenosynovial tissue around the extensor digitorum tendon. Patient given tylenol  and dilaudid  PRN for pain after I&D. Wound care nurse came by on 7/13 to change dressing and review wound care with patient. Most recent CBC showed resolution of leukocytosis on day of discharge 7/14 with WBC count 7.2. Patient was on IV vancomycin  until day of discharge, where patient was then started on 11 days of doxycycline  BID. Patient aware of need to make PCP appointment in the next 2 weeks on day of discharge 7/14.  AKI vs CKD:  Patient had elevated creatinine first seen in EMR after hospitalization for critical limb ischemia and DVT. Patient had creatinine 1.53 on 7/11, so held valsartan. Creatinine improved some on 7/12 of 1.22 with continuously elevated BP, so amlodipine  10 mg restarted. Patient stopped on valsartan until follow up with PCP.   Hypercoagulable state:  Patient on eliquis , plavix , and aspirin   2/2 critical limb ischemia in 11/2023 and this was held until after her I&D and then restarted. EMR reviewed to find that follow up to evaluate this regimen due to easy bruising with ABIs, left aortoiliac duplex, and left lower extremity DVT study (scheduled for 06/2024) and patient was instructed to move this appointment up for sooner evaluation.

## 2024-04-23 NOTE — Plan of Care (Signed)

## 2024-04-23 NOTE — Progress Notes (Signed)
 Patient resting comfortably.  Pain well controlled.  Incision on dorsum of hand is clean and dry.  Penrose drain removed.  Some purple/darkened discoloration around wound but no drainage. Improved AROM of fingers.  SILT m/u/r distributions.   Patient is POD 1 s/p I&D of left dorsal hand abscess  - Intra-op culture results still pending - Will start soaks with warm, diluted Hibiclens  solution today; order placed - Reviewed the importance of daily wound care at home; patient may need gauze and dressing supplies to go home with - No additional surgical plans - Dispo pending final abx choice  Bebe Galla, M.D. [facility]

## 2024-04-23 NOTE — Progress Notes (Addendum)
 HD#2 SUBJECTIVE:  Patient Summary: Zoe Snyder is a 68 y.o. with a pertinent PMH of PAD with critical limb ischemia and HTN, who presented with swelling and erythema of the LUE and admitted for cellulitis.   Overnight Events: No overnight events  Interim History: Patient rates her left hand pain at baseline 6/10 with increase to 7-8/10 in evenings. She notes that the pain at night is a burning pain. She notes some relief with tylenol .  OBJECTIVE:  Vital Signs: Vitals:   04/23/24 0007 04/23/24 0533 04/23/24 0742 04/23/24 1624  BP: (!) 160/88 (!) 154/79 (!) 160/70 (!) 148/69  Pulse: 72 75 72 73  Resp: 20 17 16 17   Temp: 97.8 F (36.6 C) 97.8 F (36.6 C) 98 F (36.7 C) 98 F (36.7 C)  TempSrc: Oral Oral Oral Oral  SpO2: 94% 96% 98% 99%  Weight:      Height:       Supplemental O2: Room Air SpO2: 99 %  Filed Weights   04/21/24 0957 04/22/24 0405 04/22/24 0718  Weight: 81.6 kg 85 kg 85 kg     Intake/Output Summary (Last 24 hours) at 04/23/2024 1745 Last data filed at 04/23/2024 1636 Gross per 24 hour  Intake 908.34 ml  Output --  Net 908.34 ml   Net IO Since Admission: 1,643.34 mL [04/23/24 1745]  Physical Exam: Physical Exam Const:: Awake, alert in NAD HENT: Normocephalic, atraumatic, mucus membranes moist Card: RRR, No MRG, No pitting edema on LE's bilaterally  Resp: LCTAB, no increased work of breathing Extremities: Warm, pink Left arm: left hand wrapped in unsoiled bandage. No swelling or erythema appreciated on left forearm.  Neuro: Conversing in complete sentences, normal thought content Patient Lines/Drains/Airways Status     Active Line/Drains/Airways     Name Placement date Placement time Site Days   Peripheral IV 04/21/24 20 G 1.88 Anterior;Right Forearm 04/21/24  1332  Forearm  2   Open Drain Left;Dorsal Hand  04/22/24  0852  Hand  1   Wound 04/22/24 0710 Surgical Closed Surgical Incision Hand Left 04/22/24  0710  Hand  1             Pertinent labs and imaging:  Blood cultures: No growth since 7/11 Wound cultures: Few WBC, predominantly PMN. Bacterial culture still pending    Latest Ref Rng & Units 04/23/2024    9:12 AM 04/22/2024   10:49 AM 04/21/2024   10:02 AM  CBC  WBC 4.0 - 10.5 K/uL 15.3  9.9  13.2   Hemoglobin 12.0 - 15.0 g/dL 88.8  88.1  87.4   Hematocrit 36.0 - 46.0 % 33.0  36.0  38.1   Platelets 150 - 400 K/uL 243  211  239        Latest Ref Rng & Units 04/23/2024    9:12 AM 04/22/2024   10:49 AM 04/21/2024   10:02 AM  CMP  Glucose 70 - 99 mg/dL 731  853  817   BUN 8 - 23 mg/dL 29  25  22    Creatinine 0.44 - 1.00 mg/dL 8.91  8.77  8.46   Sodium 135 - 145 mmol/L 135  136  138   Potassium 3.5 - 5.1 mmol/L 4.0  5.2  3.9   Chloride 98 - 111 mmol/L 106  107  106   CO2 22 - 32 mmol/L 19  21  20    Calcium  8.9 - 10.3 mg/dL 9.2  9.2  9.4   Total Protein 6.5 - 8.1  g/dL   6.9   Total Bilirubin 0.0 - 1.2 mg/dL   0.7   Alkaline Phos 38 - 126 U/L   70   AST 15 - 41 U/L   11   ALT 0 - 44 U/L   13     No results found.  ASSESSMENT/PLAN:  Assessment: Principal Problem:   Abscess of dorsum of left hand Active Problems:   Cellulitis of left hand excluding fingers and thumb   AKI (acute kidney injury) (HCC)   Hypercoagulable state (HCC)   PAD (peripheral artery disease) (HCC)   History of DVT of lower extremity   Plan: #LUE cellulitis MRI 7/11 of left hand revealed enhancing subcutaneous edema in the dorusm of the wrist and hand, tracking into the second through fifth fingers, favoring cellulitis. Small focal subcutaneous fluid collection seen measuring 1.1 bt 0.4 by 1.0 cm. Patient had I&D of L hand dorsal abscess yesterday revealing abundant purulence and debridement of nonviable skin and subcutaneous tissue at site of infectious tenosynovitis of extensor digitorum to middle finger. Findings included abundant slimy appearing tenosynovial tissue around the extensor digitorum tendon. Bacterial cultures  of wound still pending. Patient is afebrile and most recent CBC from 7/12 indicated decreased WBC count to 9.9 with leukocytosis resolved. Per surgeon, may consider linezolid or doxycycline  for PO antibiotic in case of d/c tomorrow. Patient having burning pain at night of left hand ranking 7-8/10, possibly neuropathic in etiology 2/2 nerve irritation after I&D, so can consider gabapentin .  -Vancomycin  -Bactroban  ointment -Wound care regimen -Wound care nurse to come by today -Monitor for CBC return for newest WBC count -Tylenol  PRN -Dilaudid  PRN -Start gabapentin   #CKD vs AKI Creatinine improved in BMP from 7/12 at 1.22. BP has continued to be elevated as patient has not been on at home BP regimen with most recently BP 160/70.  -Will restart outpatient amlodipine  10mg  -Monitor for BMP return -AM BMP  #PAD Hypercoagulable state VTE even with critical limb ischemia in February of this year. She is on eliquis  and plavix  at home.  -Continue on plavix  75mg  and eliquis  5mg   Best Practice: Diet: thin food diet IVF: Fluids: none, Rate: None VTE:  Code: Full  Disposition planning: Therapy Recs: Pending, DME: none Family Contact: Son [name], to be notified. DISPO: Anticipated discharge pending to Home pending Bacterial culture and clinical improvement.  Signature:  Mliss Arlean Pouch MS3  5:45 PM, 04/23/2024

## 2024-04-24 LAB — CBC
HCT: 32.1 % — ABNORMAL LOW (ref 36.0–46.0)
Hemoglobin: 10.6 g/dL — ABNORMAL LOW (ref 12.0–15.0)
MCH: 30.4 pg (ref 26.0–34.0)
MCHC: 33 g/dL (ref 30.0–36.0)
MCV: 92 fL (ref 80.0–100.0)
Platelets: 233 K/uL (ref 150–400)
RBC: 3.49 MIL/uL — ABNORMAL LOW (ref 3.87–5.11)
RDW: 13.6 % (ref 11.5–15.5)
WBC: 7.2 K/uL (ref 4.0–10.5)
nRBC: 0 % (ref 0.0–0.2)

## 2024-04-24 LAB — HEMOGLOBIN A1C
Hgb A1c MFr Bld: 6.2 % — ABNORMAL HIGH (ref 4.8–5.6)
Mean Plasma Glucose: 131 mg/dL

## 2024-04-24 MED ORDER — DOXYCYCLINE HYCLATE 100 MG PO TABS: 100.0000 mg | ORAL_TABLET | Freq: Two times a day (BID) | ORAL | 0 refills | Status: AC

## 2024-04-24 MED ORDER — VANCOMYCIN HCL IN DEXTROSE 1-5 GM/200ML-% IV SOLN: 1000.0000 mg | INTRAVENOUS | Status: DC

## 2024-04-24 MED ORDER — ACETAMINOPHEN 500 MG PO TABS: 1000.0000 mg | ORAL_TABLET | Freq: Four times a day (QID) | ORAL | 0 refills | Status: AC

## 2024-04-24 MED ORDER — DOXYCYCLINE HYCLATE 100 MG PO TABS
100.0000 mg | ORAL_TABLET | Freq: Two times a day (BID) | ORAL | Status: DC
  Administered 2024-04-24: 100 mg via ORAL
  Filled 2024-04-24: qty 1

## 2024-04-24 MED ORDER — HYDROMORPHONE HCL 2 MG PO TABS: 1.0000 mg | ORAL_TABLET | Freq: Three times a day (TID) | ORAL | 0 refills | Status: AC | PRN

## 2024-04-24 NOTE — Progress Notes (Signed)
 Pharmacy Antibiotic Note  Zoe Snyder is a 68 y.o. female admitted on 04/21/2024 with cellulitis.  Pharmacy has been consulted for vancomycin  dosing. Pt with AKI. Renal function is improving. WBC increased from 9.9k to 15.3k, but pt remains afebrile. Pt s/p I&D 7/12.   Plan: Adjust vancomycin  to 1000 mg IV every 24 hours. Goal AUC 400-550 (Scr 1.08 Vd 0.5, adjBW, eAUC 493) Monitor renal function and clinical picture daily. Continue to follow up on cultures and sensitivities. Monitor levels as appropriate.   Height: 5' 2.99 (160 cm) Weight: 85 kg (187 lb 6.3 oz) IBW/kg (Calculated) : 52.38  Temp (24hrs), Avg:98.4 F (36.9 C), Min:98 F (36.7 C), Max:98.7 F (37.1 C)  Recent Labs  Lab 04/21/24 1002 04/21/24 1016 04/21/24 1417 04/22/24 1049 04/23/24 0912  WBC 13.2*  --   --  9.9 15.3*  CREATININE 1.53*  --   --  1.22* 1.08*  LATICACIDVEN  --  1.1 1.0  --   --     Estimated Creatinine Clearance: 52.2 mL/min (A) (by C-G formula based on SCr of 1.08 mg/dL (H)).    Allergies  Allergen Reactions   Epinephrine Anaphylaxis    Respiratory problems, e.g., wheezing;  Palpitations   Palpitations  Respiratory problems, e.g., wheezing;, Palpitations   Novocain [Procaine] Palpitations and Other (See Comments)    Heart racing   Theraflu Severe Cold Daytime [Dm-Phenylephrine -Acetaminophen ] Palpitations    Antimicrobials this admission: 7/11 Vancomycin  >> 7/12 Cefazolin  2g IV x1   Dose adjustments this admission: 7/12 Vancomycin    Microbiology results: 7/11 BCx: ngtd3 7/4 UCx: no growth  7/11 MRSA PCR: neg 7/12 Fluid cx: moderate S.aureus  Thank you for allowing pharmacy to be a part of this patient's care.  Benedetta Heath BS, PharmD, BCPS Clinical Pharmacist 04/24/2024 6:40 AM  Contact: [phone] after 3 PM  Be curious, not judgmental... -Davina Sprinkles

## 2024-04-24 NOTE — Progress Notes (Signed)
   04/24/24 1324  TOC Brief Assessment  Insurance and Status Reviewed  Patient has primary care physician Yes  Home environment has been reviewed home  Prior level of function: self/independent  Prior/Current Home Services No current home services  Social Drivers of Health Review SDOH reviewed no interventions necessary  Readmission risk has been reviewed Yes  Transition of care needs no transition of care needs at this time    Pt stable for transition home today, no CM needs noted for discharge.

## 2024-04-24 NOTE — Plan of Care (Signed)

## 2024-04-24 NOTE — Discharge Summary (Signed)
 Name: Zoe Snyder MRN: [id number] DOB: April 12, 1956 68 y.o. PCP: [facility] [address]  Date of Admission: 04/21/2024  9:51 AM Date of Discharge: 04/21/2024 Attending Physician: Dr. Dayton Eastern  Discharge Diagnosis: 1. Principal Problem:   Abscess of dorsum of left hand Active Problems:   Cellulitis of left hand excluding fingers and thumb   AKI (acute kidney injury) (HCC)   Hypercoagulable state (HCC)   PAD (peripheral artery disease) (HCC)   History of DVT of lower extremity   Discharge Medications: Allergies as of 04/24/2024       Reactions   Epinephrine Anaphylaxis   Respiratory problems, e.g., wheezing; Palpitations  Palpitations Respiratory problems, e.g., wheezing;, Palpitations   Novocain [procaine] Palpitations, Other (See Comments)   Heart racing   Theraflu Severe Cold Daytime [dm-phenylephrine -acetaminophen ] Palpitations        Medication List     PAUSE taking these medications    valsartan 80 MG tablet Wait to take this until your doctor or other care provider tells you to start again. Commonly known as: DIOVAN Take 80 mg by mouth daily.       STOP taking these medications    albuterol  (2.5 MG/3ML) 0.083% nebulizer solution Commonly known as: PROVENTIL    albuterol  108 (90 Base) MCG/ACT inhaler Commonly known as: VENTOLIN  HFA   Breo Ellipta  100-25 MCG/ACT Aepb Generic drug: fluticasone  furoate-vilanterol   doxycycline  100 MG capsule Commonly known as: VIBRAMYCIN  Replaced by: doxycycline  100 MG tablet   famotidine  20 MG tablet Commonly known as: PEPCID    gabapentin  300 MG capsule Commonly known as: Neurontin    umeclidinium bromide  62.5 MCG/ACT Aepb Commonly known as: INCRUSE ELLIPTA        TAKE these medications    acetaminophen  500 MG tablet Commonly known as: TYLENOL  Take 2 tablets (1,000 mg total) by mouth every 6 (six) hours.   amLODipine  10 MG tablet Commonly known as: NORVASC  Take 1 tablet (10 mg total) by mouth  daily. Due for follow up visit   apixaban  5 MG Tabs tablet Commonly known as: Eliquis  Take 1 tablet (5 mg total) by mouth 2 (two) times daily.   aspirin  EC 81 MG tablet Take 1 tablet (81 mg total) by mouth daily at 6 (six) AM. Swallow whole.   atorvastatin  20 MG tablet Commonly known as: LIPITOR Take 20 mg by mouth at bedtime.   clopidogrel  75 MG tablet Commonly known as: PLAVIX  Take 1 tablet (75 mg total) by mouth daily.   doxycycline  100 MG tablet Commonly known as: VIBRA -TABS Take 1 tablet (100 mg total) by mouth every 12 (twelve) hours for 11 days. Replaces: doxycycline  100 MG capsule   HYDROmorphone  2 MG tablet Commonly known as: DILAUDID  Take 0.5 tablets (1 mg total) by mouth every 8 (eight) hours as needed for up to 5 days for severe pain (pain score 7-10).   ipratropium-albuterol  0.5-2.5 (3) MG/3ML Soln Commonly known as: DUONEB Take 3 mLs by nebulization every 4 (four) hours as needed.   levothyroxine  75 MCG tablet Commonly known as: SYNTHROID  Take 75 mcg by mouth every morning.   Multi-Vitamin tablet Take 1 tablet by mouth daily.   pantoprazole  20 MG tablet Commonly known as: PROTONIX  Take 1 tablet (20 mg total) by mouth daily.   promethazine -dextromethorphan 6.25-15 MG/5ML syrup Commonly known as: PROMETHAZINE -DM Take 5 mLs by mouth at bedtime as needed for cough.   Vitamin D -1000 Max St 25 MCG (1000 UT) tablet Generic drug: Cholecalciferol  Take 1 tablet (1,000 Units total) by mouth daily.  Disposition and follow-up:   Zoe Snyder was discharged from [facility] in Good condition.  At the hospital follow up visit please address:  RUE cellulitis--ensure pt is taking abx correctly and that she is tolerating them okay.  Pain Wound care Vascular surgery appointment: Was she able to move it up to reassess her medications of eliquis , aspirin , and plavix . Blood pressure--paused valsartan due to possible AKI.  Return to  work   2.  Labs / imaging needed at time of follow-up: CBC, RFP  3.  Pending labs/ test needing follow-up: Blood cultures  Follow-up Appointments:   Hospital Course by problem list: Zoe Snyder is a 68 y.o. person living with a history of PAD and VTE with critical limb ischemia, HTN who presented with RUE cellulitis and admitted for RUE celluitis  now being discharged on hospital day 3 with the following pertinent hospital course:  Below is the hospital course for Zoe Snyder with admission starting on 04/24/24:   LUE Cellulitis:  On 7/08 pt noticed a small blister or pustule with dark appearance to the center that started on 7/08 but was uncertain of inciting events. She had gone to UC at the time and was prescribed doxycycline  but had noted worsening at 24 hour follow up and was referred to emergency room. On admission on 7/11, she was started on IV vancomycin  and an MRI of left hand revealed enhancing subcutaneous edema in the dorusm of the wrist and hand, tracking into the second through fifth fingers, favoring cellulitis. Small focal subcutaneous fluid collection seen measuring 1.1 by 0.4 by 1.0 cm. Ortho hand consult was placed and pt underwent I&D of L hand on 7/13 revealing abundant purulence and debridement of nonviable skin and subcutaneous tissue at site of infectious tenosynovitis of extensor digitorum to middle finger. Most recent CBC showed resolution of leukocytosis on day of discharge 7/14 with WBC count 7.2. Patient was on IV vancomycin  until day of discharge, where patient was then started on 11 days of doxycycline  BID to complete total of 14d course. Pt was deemed to safe for discharge.  AKI vs CKD:  Patient had elevated creatinine first seen in EMR after hospitalization for critical limb ischemia and VTE. Patient had creatinine 1.53 on 7/11, so held valsartan. Creatinine improved some on 7/12 of 1.22 with continuously elevated BP, so amlodipine  10 mg restarted.  Patient stopped on valsartan until follow up with PCP.   Hypercoagulable state:  Patient on eliquis , plavix , and aspirin  2/2 critical limb ischemia in 11/2023 and this was held until after her I&D and then restarted. PT instructed by vascular surgery to continue regimen of eliquis , aspirin , and plavix  until re-eval with ABIs, left aortoiliac duplex, and left lower extremity DVT study (scheduled for 06/2024). Pt asked to move this appointment up for eval of this 3 agent regimen.   Subjective: Pt seen and examined at the bedside this morning. She was feeling much better and is okay with going home today. Instructions were given for antibiotics and wound care after discharge. All questions and concerns were addressed at this time.     Discharge Exam:   BP (!) 154/80 (BP Location: Right Arm)   Pulse 63   Temp 99 F (37.2 C) (Oral)   Resp 18   Ht 5' 2.99 (1.6 m)   Wt 85 kg   SpO2 99%   BMI 33.20 kg/m  Discharge exam:  Const: Awake, alert in NAD HENT: Normocephalic, atraumatic, mucus membranes moist Eyes:  PERRL Card: RRR, No pitting edema on LE's bilaterally  Resp: LCTAB, no increased work of breathing Abd: Soft, NTND, Bsx4 Extremities: Warm, pink. Left hand with clean dressing in place. Edema and erythema decreased in LUE.     Pertinent Labs, Studies, and Procedures:     Latest Ref Rng & Units 04/24/2024    9:13 AM 04/23/2024    9:12 AM 04/22/2024   10:49 AM  CBC  WBC 4.0 - 10.5 K/uL 7.2  15.3  9.9   Hemoglobin 12.0 - 15.0 g/dL 89.3  88.8  88.1   Hematocrit 36.0 - 46.0 % 32.1  33.0  36.0   Platelets 150 - 400 K/uL 233  243  211        Latest Ref Rng & Units 04/23/2024    9:12 AM 04/22/2024   10:49 AM 04/21/2024   10:02 AM  CMP  Glucose 70 - 99 mg/dL 731  853  817   BUN 8 - 23 mg/dL 29  25  22    Creatinine 0.44 - 1.00 mg/dL 8.91  8.77  8.46   Sodium 135 - 145 mmol/L 135  136  138   Potassium 3.5 - 5.1 mmol/L 4.0  5.2  3.9   Chloride 98 - 111 mmol/L 106  107  106   CO2 22 -  32 mmol/L 19  21  20    Calcium  8.9 - 10.3 mg/dL 9.2  9.2  9.4   Total Protein 6.5 - 8.1 g/dL   6.9   Total Bilirubin 0.0 - 1.2 mg/dL   0.7   Alkaline Phos 38 - 126 U/L   70   AST 15 - 41 U/L   11   ALT 0 - 44 U/L   13     MR HAND LEFT W WO CONTRAST Result Date: 04/21/2024 CLINICAL DATA:  Soft tissue infection related to insect bite a EXAM: MRI OF THE LEFT HAND WITHOUT AND WITH CONTRAST TECHNIQUE: Multiplanar, multisequence MR imaging of the left hand was performed before and after the administration of intravenous contrast. CONTRAST:  8mL GADAVIST  GADOBUTROL  1 MMOL/ML IV SOLN COMPARISON:  Radiograph 04/21/2024 FINDINGS: Bones/Joint/Cartilage Unremarkable Ligaments Grossly unremarkable Muscles and Tendons Unremarkable Soft tissues Enhancing subcutaneous edema in the dorsum of the wrist and hand, and tracking into the second through fifth fingers, favoring cellulitis. Small focal subcutaneous fluid collection measuring 1.1 by 0.4 by 1.0 cm (volume = 0.2 cm^3) noted dorsal to the distal middle finger metacarpal metadiaphysis. Suspected ganglion cyst volar to the radiocarpal articulation extending deep to the radial artery and flexor carpi radialis tendon on image 40 series 8, and possibly with a superficial component as well. This measures about 1.1 by 0.8 by 0.9 cm (volume = 0.4 cm^3) and may have mild internal septation. IMPRESSION: 1. Enhancing subcutaneous edema in the dorsum of the wrist and hand, and tracking into the second through fifth fingers, favoring cellulitis. Small focal subcutaneous fluid collection measuring 1.1 by 0.4 by 1.0 cm (volume = 0.2 cm^ 3) noted dorsal to the distal middle finger metacarpal metadiaphysis, suspicious for a small abscess or dorsally draining fluid collection. 2. Suspected ganglion cyst volar to the radiocarpal articulation extending deep to the radial artery and flexor carpi radialis tendon, and possibly with a superficial component as well. Electronically Signed   By:  Ryan Salvage M.D.   On: 04/21/2024 18:18   DG Hand 2 View Left Result Date: 04/21/2024 CLINICAL DATA:  Left hand infection after insect bite. EXAM: LEFT HAND -  2 VIEW COMPARISON:  None Available. FINDINGS: There is no evidence of fracture or dislocation. There is no evidence of arthropathy or other focal bone abnormality. Mild dorsal soft tissue swelling is noted. IMPRESSION: Mild dorsal soft tissue swelling is noted suggesting infection. No fracture or dislocation. Electronically Signed   By: Lynwood Landy Raddle M.D.   On: 04/21/2024 13:53     Discharge Instructions: Discharge Instructions     Call MD for:  redness, tenderness, or signs of infection (pain, swelling, redness, odor or green/yellow discharge around incision site)   Complete by: As directed    Call MD for:  severe uncontrolled pain   Complete by: As directed    Call MD for:  temperature >100.4   Complete by: As directed    Diet - low sodium heart healthy   Complete by: As directed    Discharge instructions   Complete by: As directed    Thank you for allowing us  to be part of your care. You were hospitalized for RUE cellulitis. We treated you with antibiotics and surgery.  See the changes in your medications and management of your chronic conditions below:  *For your cellulitis (skin infection) -We have STARTED you on these following medications:  -Doxycycline  (an antibiotic)--Take 1 pil every 12 hours for 11 days.   -Tylenol -- Take up to 2 tablets every 8 hours for pain  -Hydromorphone -- Take 1/2 tablet every 8 hours if needed for pain.    -WOUND CARE:  -Wash the wound with warm, soapy water. Dry completely and apply new bandage once per day.   -Call the orthopedic surgeon (Dr. Romona) if you have any issues.    *For your High Blood Pressure -We have continued you on these following medications:  -Amlodipine   -We have STOPPED the following medications:  -Valsartan--Please pause taking until you talk to your  PCP  -Please see your PCP in 7 to 10 days -You have been excused from work until the 25th. If you need more time, please talk to your PCP or the surgeon.  -Please try to move up your vascular surgery appointment to reevaluate your blood thinners.   FOLLOW UP APPOINTMENTS: Call your PCP to set up an appointment  Please make sure to take your antibiotics as instructed and do careful wound care.   Please call your PCP or our clinic if you have any questions or concerns, we may be able to help and keep you from a long and expensive emergency room wait. Our clinic and after hours phone number is [phone]. The best time to call is Monday through Friday 9 am to 4 pm but there is always someone available 24/7 if you have an emergency. If you need medication refills please notify your pharmacy one week in advance and they will send us  a request.   We are glad you are feeling better,  Schuyler Novak, DO Internal Medicine Inpatient Teaching Service at [facility]   Increase activity slowly   Complete by: As directed    No wound care   Complete by: As directed        Signed: Novak Schuyler, DO 04/24/2024, 2:26 PM

## 2024-04-24 NOTE — Discharge Instructions (Addendum)
 Thank you for allowing us  to be part of your care. You were hospitalized for RUE cellulitis. We treated you with antibiotics and surgery.  See the changes in your medications and management of your chronic conditions below:  *For your cellulitis (skin infection) -We have STARTED you on these following medications:  -Doxycycline  (an antibiotic)--Take 1 pil every 12 hours for 11 days.   -Tylenol -- Take up to 2 tablets every 8 hours for pain  -Hydromorphone -- Take 1/2 tablet every 8 hours if needed for pain.    -WOUND CARE:  -Wash the wound with warm, soapy water. Dry completely and apply new bandage once per day.   -Call the orthopedic surgeon (Dr. Romona) if you have any issues.    *For your High Blood Pressure -We have continued you on these following medications:  -Amlodipine   -We have STOPPED the following medications:  -Valsartan--Please pause taking until you talk to your PCP  -Please see your PCP in 7 to 10 days -You have been excused from work until the 25th. If you need more time, please talk to your PCP or the surgeon.  -Please try to move up your vascular surgery appointment to reevaluate your blood thinners.   FOLLOW UP APPOINTMENTS: Call your PCP to set up an appointment  Please make sure to take your antibiotics as instructed and do careful wound care.   Please call your PCP or our clinic if you have any questions or concerns, we may be able to help and keep you from a long and expensive emergency room wait. Our clinic and after hours phone number is [phone]. The best time to call is Monday through Friday 9 am to 4 pm but there is always someone available 24/7 if you have an emergency. If you need medication refills please notify your pharmacy one week in advance and they will send us  a request.   We are glad you are feeling better,  Schuyler Novak, DO Internal Medicine Inpatient Teaching Service at [facility]

## 2024-04-24 NOTE — Progress Notes (Signed)
 DISCHARGE NOTE HOME Zoe Snyder to be discharged Home per MD order. Discussed prescriptions and follow up appointments with the patient. Prescriptions given to patient; medication list explained in detail. Patient verbalized understanding.  Skin clean, dry and intact without evidence of skin break down, no evidence of skin tears noted. IV catheter discontinued intact. Site without signs and symptoms of complications. Dressing and pressure applied. Pt denies pain at the site currently. No complaints noted.  See Lda for wound on handPatient free of lines, drains, and wounds.   An After Visit Summary (AVS) was printed and given to the patient. Patient escorted via wheelchair, and discharged home via private auto.  Peyton SHAUNNA Pepper, RN

## 2024-04-26 LAB — CULTURE, BLOOD (ROUTINE X 2)
Culture: NO GROWTH
Special Requests: ADEQUATE

## 2024-04-27 LAB — AEROBIC/ANAEROBIC CULTURE W GRAM STAIN (SURGICAL/DEEP WOUND)

## 2024-05-02 ENCOUNTER — Other Ambulatory Visit: Payer: Self-pay

## 2024-05-02 MED ORDER — CLOPIDOGREL BISULFATE 75 MG PO TABS: 75.0000 mg | ORAL_TABLET | Freq: Every day | ORAL | 3 refills | Status: DC

## 2024-06-13 ENCOUNTER — Telehealth: Payer: Self-pay

## 2024-06-13 NOTE — Telephone Encounter (Signed)
 After speaking with scheduler and moving up pt's appts to this month, pt called triage with c/o feeling like she is hemorrhaging, however, she is not bleeding and has not bled. She has c/o having bowel movements when she walks and having to wear a Depends. She has made an appt with her PCP for this week. She will call us  if she needs further assistance.

## 2024-06-16 ENCOUNTER — Encounter (HOSPITAL_COMMUNITY)

## 2024-06-27 ENCOUNTER — Ambulatory Visit

## 2024-06-27 ENCOUNTER — Encounter (HOSPITAL_COMMUNITY)

## 2024-06-30 ENCOUNTER — Encounter (HOSPITAL_COMMUNITY)

## 2024-06-30 ENCOUNTER — Ambulatory Visit (HOSPITAL_COMMUNITY)

## 2024-06-30 ENCOUNTER — Other Ambulatory Visit (HOSPITAL_BASED_OUTPATIENT_CLINIC_OR_DEPARTMENT_OTHER): Payer: Self-pay | Admitting: Family Medicine

## 2024-06-30 DIAGNOSIS — M8580 Other specified disorders of bone density and structure, unspecified site: Secondary | ICD-10-CM

## 2024-07-04 ENCOUNTER — Encounter: Payer: Self-pay | Admitting: Physician Assistant

## 2024-07-14 ENCOUNTER — Ambulatory Visit (HOSPITAL_COMMUNITY)
Admission: RE | Admit: 2024-07-14 | Discharge: 2024-07-14 | Disposition: A | Discharge status: Home / Self Care | Source: Ambulatory Visit | Attending: Physician Assistant | Admitting: Physician Assistant

## 2024-07-14 DIAGNOSIS — I745 Embolism and thrombosis of iliac artery: Secondary | ICD-10-CM | POA: Insufficient documentation

## 2024-07-14 DIAGNOSIS — I70229 Atherosclerosis of native arteries of extremities with rest pain, unspecified extremity: Secondary | ICD-10-CM

## 2024-07-14 DIAGNOSIS — I82402 Acute embolism and thrombosis of unspecified deep veins of left lower extremity: Secondary | ICD-10-CM

## 2024-07-14 DIAGNOSIS — I739 Peripheral vascular disease, unspecified: Secondary | ICD-10-CM | POA: Diagnosis present

## 2024-08-01 ENCOUNTER — Encounter (HOSPITAL_COMMUNITY)

## 2024-08-01 ENCOUNTER — Ambulatory Visit (HOSPITAL_COMMUNITY)
Admission: RE | Admit: 2024-08-01 | Discharge: 2024-08-01 | Disposition: A | Discharge status: Home / Self Care | Source: Ambulatory Visit | Attending: Physician Assistant | Admitting: Physician Assistant

## 2024-08-01 ENCOUNTER — Ambulatory Visit (HOSPITAL_COMMUNITY)

## 2024-08-01 DIAGNOSIS — I739 Peripheral vascular disease, unspecified: Secondary | ICD-10-CM | POA: Insufficient documentation

## 2024-08-01 DIAGNOSIS — I745 Embolism and thrombosis of iliac artery: Secondary | ICD-10-CM | POA: Diagnosis not present

## 2024-08-01 DIAGNOSIS — I82402 Acute embolism and thrombosis of unspecified deep veins of left lower extremity: Secondary | ICD-10-CM | POA: Insufficient documentation

## 2024-08-01 DIAGNOSIS — I70229 Atherosclerosis of native arteries of extremities with rest pain, unspecified extremity: Secondary | ICD-10-CM | POA: Insufficient documentation

## 2024-08-01 LAB — VAS US ABI WITH/WO TBI
Left ABI: 0.89
Right ABI: 0.61

## 2024-08-22 ENCOUNTER — Ambulatory Visit: Admitting: Vascular Surgery

## 2024-08-24 ENCOUNTER — Ambulatory Visit: Admitting: Physician Assistant

## 2024-09-11 NOTE — Progress Notes (Unsigned)
 POST OPERATIVE OFFICE NOTE    CC:  F/u for surgery  HPI:  Zoe Snyder s a 68 y.o. female who is s/p left common iliac artery angioplasty and stenting with left common femoral artery cutdown and direct repair on 11/18/2023 by Dr.Viviann Broyles. This was done for LLE acute limb ischemia. Upon hospitalization she was also found to have acute DVT of the left femoral and popliteal veins. She was started on heparin  and transitioned to Eliquis  at d/c.   Post operatively she was having some intermittent coldness in her feet and toes. Her incision was healing okay with mild drainage.  At follow up today she says she is doing okay. She is still having some intermittent coldness and throbbing in her feet and toes. She denies any constant pain or rest pain. She also denies any claudication. She does endorse generalized fatigue and does not feel like she can go back to work yet. She usually works 3 days a week for 12 hr shifts.  She denies any issues with lower extremity swelling.  She is wondering how long she has to take aspirin , Plavix , and Eliquis .  She says that she has been bruising fairly easily since her surgery.   Allergies  Allergen Reactions   Epinephrine Anaphylaxis    Respiratory problems, e.g., wheezing;  Palpitations   Palpitations  Respiratory problems, e.g., wheezing;, Palpitations   Novocain [Procaine] Palpitations and Other (See Comments)    Heart racing   Theraflu Severe Cold Daytime [Dm-Phenylephrine -Acetaminophen ] Palpitations    Current Outpatient Medications  Medication Sig Dispense Refill   amLODipine  (NORVASC ) 10 MG tablet Take 1 tablet (10 mg total) by mouth daily. Due for follow up visit 30 tablet 1   apixaban  (ELIQUIS ) 5 MG TABS tablet Take 1 tablet (5 mg total) by mouth 2 (two) times daily. 60 tablet 1   aspirin  EC 81 MG tablet Take 1 tablet (81 mg total) by mouth daily at 6 (six) AM. Swallow whole. 30 tablet 12   atorvastatin  (LIPITOR) 20 MG tablet Take 20 mg by  mouth at bedtime.     Cholecalciferol  (VITAMIN D -1000 MAX ST) 25 MCG (1000 UT) tablet Take 1 tablet (1,000 Units total) by mouth daily. 12 tablet 0   clopidogrel  (PLAVIX ) 75 MG tablet Take 1 tablet (75 mg total) by mouth daily. 30 tablet 3   ipratropium-albuterol  (DUONEB) 0.5-2.5 (3) MG/3ML SOLN Take 3 mLs by nebulization every 4 (four) hours as needed. 360 mL 1   levothyroxine  (SYNTHROID ) 75 MCG tablet Take 75 mcg by mouth every morning.     Multiple Vitamin (MULTI-VITAMIN) tablet Take 1 tablet by mouth daily.     valsartan (DIOVAN) 80 MG tablet Take 80 mg by mouth daily.     albuterol  (PROVENTIL ) (2.5 MG/3ML) 0.083% nebulizer solution Inhale 3 mLs (2.5 mg total) by nebulization every 6 (six) hours as needed for wheezing or shortness of breath. 270 mL 0   albuterol  (VENTOLIN  HFA) 108 (90 Base) MCG/ACT inhaler Inhale 2 puffs into the lungs every 4 (four) hours as needed for wheezing or shortness of breath. 8 g 0   APIXABAN  (ELIQUIS ) VTE STARTER PACK (10MG  AND 5MG ) Take as directed on package: start with two-5mg  tablets twice daily for 7 days. On day 8, switch to one-5mg  tablet twice daily. 74 each 0   fluticasone  furoate-vilanterol (BREO ELLIPTA ) 100-25 MCG/ACT AEPB Inhale 1 puff into the lungs daily. 60 each 0   methylPREDNISolone  (MEDROL ) 4 MG tablet Take 4 mg by mouth See admin instructions. Take  6 tablets on day 1 Take 5 tablets on day 2 Take 4 tablets on day 3  Take 3 tablets on day 4 Take 2 tablets on day 5 Take 1 tablet on day 6 (Patient not taking: Reported on 11/19/2023)     oxyCODONE  (OXY IR/ROXICODONE ) 5 MG immediate release tablet Take 1 tablet (5 mg total) by mouth every 6 (six) hours as needed for moderate pain (pain score 4-6). (Patient not taking: Reported on 12/28/2023) 20 tablet 0   predniSONE  (DELTASONE ) 20 MG tablet Take 40 mg by mouth daily with breakfast. For 5 days. (Patient not taking: Reported on 11/19/2023)     promethazine -dextromethorphan (PROMETHAZINE -DM) 6.25-15 MG/5ML  syrup Take 5 mLs by mouth at bedtime as needed for cough.     umeclidinium bromide  (INCRUSE ELLIPTA ) 62.5 MCG/ACT AEPB Inhale 1 puff into the lungs daily. 30 each 0   No current facility-administered medications for this visit.     ROS:  See HPI  Physical Exam:  Incision: Left groin incision well-healed without signs of infection Extremities: Brisk DP/PT Doppler signals in bilateral lower extremities.  No tissue loss of left foot Neuro: Intact motor and sensation of bilateral lower extremities  Studies: ABIs (12/24/2023) Right    Rt Pressure (mmHg)IndexWaveform  Comment   +---------+------------------+-----+----------+--------+  Brachial 159                                        +---------+------------------+-----+----------+--------+  PTA     80                0.50 monophasic          +---------+------------------+-----+----------+--------+  DP      73                0.46 monophasic          +---------+------------------+-----+----------+--------+  Great Toe35                0.22                     +---------+------------------+-----+----------+--------+   +---------+------------------+-----+-----------+-------+  Left    Lt Pressure (mmHg)IndexWaveform   Comment  +---------+------------------+-----+-----------+-------+  Brachial 154                                        +---------+------------------+-----+-----------+-------+  PTA     138               0.87 biphasic            +---------+------------------+-----+-----------+-------+  DP      120               0.75 multiphasic         +---------+------------------+-----+-----------+-------+  Great Toe55                0.35                     +---------+------------------+-----+-----------+-------+   +-------+-----------+-----------+------------+------------+  ABI/TBIToday's ABIToday's TBIPrevious ABIPrevious TBI   +-------+-----------+-----------+------------+------------+  Right 0.50       0.22                                 +-------+-----------+-----------+------------+------------+  Left  0.87  0.35                                  Aorta/IVC/Iliac Duplex (12/24/2023) Patent left common iliac artery stent without stenosis   Assessment/Plan:  This is a 68 y.o. female who is here for repeat postop check  -The patient recently underwent angiogram with left common femoral artery cutdown, femoral artery repair, and left common iliac artery stenting for acute limb ischemia -Baseline ABIs have been obtained.  Her left ABI 0.87 and right ABI 0.5 -Duplex demonstrates a patent left common iliac artery stent without stenosis -She no longer has any rest pain in her left foot.  She does have intermittent issues with pain/coldness in her toes bilaterally.  This is not severe or persistent.  She has no tissue loss -On exam her left groin incision is well-healed.  Her lower extremities are warm and well-perfused with brisk DP/PT Doppler signals -She states that she still feels very fatigued from surgery and does not feel ready to go back to work.  I have given her a return to work letter for January 31, 2024 -She would like to discontinue some of her medications when possible due to easy bruising.  At this time I have told her to continue Eliquis , aspirin , and Plavix .  She can return to clinic in 6 months with ABIs, left aortoiliac duplex, and left lower extremity DVT study.  As long as her DVT study appears stable and her symptoms are well-controlled, she can likely discontinue her Eliquis 

## 2024-09-12 ENCOUNTER — Encounter: Payer: Self-pay | Admitting: Vascular Surgery

## 2024-09-12 ENCOUNTER — Ambulatory Visit: Attending: Vascular Surgery | Admitting: Vascular Surgery

## 2024-09-12 VITALS — BP 148/88 | HR 86 | Temp 98.2°F | Ht 62.0 in | Wt 171.0 lb

## 2024-09-12 DIAGNOSIS — I739 Peripheral vascular disease, unspecified: Secondary | ICD-10-CM

## 2024-09-21 ENCOUNTER — Encounter: Payer: Self-pay | Admitting: Vascular Surgery

## 2024-10-19 ENCOUNTER — Ambulatory Visit: Admitting: Pharmacist

## 2024-10-19 NOTE — Progress Notes (Unsigned)
 " [facility] Pharmacist Note  Name: Zoe Snyder  MRN: [id number]  DOB: [birth year]  Sex: female PCP: [facility] Referral Provider: Dr. Magda  HISTORY OF PRESENT ILLNESS: Zoe Snyder is a 69 y.o. female with PMH PAD s/p left common iliac artery angioplasty and stenting with left common femoral artery cutdown and direct repair on 11/18/2023 who presents for medication management for cardiovascular risk reduction.   Dyslipidemia/ASCVD  Current lipid-lowering medications: atorvastatin  40 mg daily  Previously tried medications/intolerances: ***  Rx affordability and access: [employer] Dual Complete   Current antiplatelets/antithrombotics: aspirin  81 mg daily   Tobacco Abuse Current medications: ***  Rx affordability and access: ***   Tobacco Use History:   Age when started using tobacco on a daily basis: ***  Number of cigarettes per day: ***   Smokes first cigarette *** minutes after waking  {Does/does not:3044014::Does,Does not} wake at night to smoke  Triggers to smoke: ***   Quit Attempt History:   Most recent quit attempt: ***  Longest time ever been tobacco free: ***  Methods tried in the past: {CHL AMB [facility] MEDICATIONS FOR SMOKING CESSATION:20759}  Rates importance of quitting tobacco on 1-10 scale of ***  Rates readiness of quitting tobacco on 1-10 scale of ***  Rates confidence of quitting tobacco on 1-10 scale of ***  Motivators to quit: ***  Barriers to quit: {smoking cessation barriers:18118}   Current dietary habits:   Breakfast: ***  Lunch: ***  Supper: ***  Snacks: ***  Drinks: ***   Current physical activity: ***   Patient {ACTION; IS/IS NOT:21021397} up to date on annual influenza vaccine.  Patient {ACTION; IS/IS NOT:21021397} up to date on COVID vaccines.   Past Medical History:  Diagnosis Date   COPD (chronic obstructive pulmonary disease) (HCC)    Hashimoto's thyroiditis    History of alcohol abuse 11/30/2017   History of cocaine abuse  (HCC) 11/30/2017   History of colon polyps    Hyperlipidemia    Hypertension    Prediabetes    Throat cancer (HCC)    Tobacco use disorder    Past Surgical History:  Procedure Laterality Date   ANGIOPLASTY  11/18/2023   Procedure: LEFT COMMON ILIAC ANGIOPLASTY USING VIABAHN 7 MM X AND 7 MM X 59 MM STENTS;  Surgeon: Magda Debby SAILOR, MD;  Location: [facility] OR;  Service: Vascular;;   AORTOGRAM  11/18/2023   Procedure: AORTOGRAM;  Surgeon: Magda Debby SAILOR, MD;  Location: [facility] OR;  Service: Vascular;;   COLONOSCOPY W/ POLYPECTOMY  2016   GROIN EXPOSURE Left 11/18/2023   Procedure: LEFT GROIN AND FEMORAL ARTERY EXPOSURE;  Surgeon: Magda Debby SAILOR, MD;  Location: [facility] OR;  Service: Vascular;  Laterality: Left;   INCISION AND DRAINAGE OF WOUND Left 04/22/2024   Procedure: IRRIGATION AND DEBRIDEMENT WOUND;  Surgeon: Romona Harari, MD;  Location: [facility] OR;  Service: Orthopedics;  Laterality: Left;  Left Hand Possible Forearm   THROAT SURGERY  2018   cancer   ULTRASOUND GUIDANCE FOR VASCULAR ACCESS Right 11/18/2023   Procedure: ULTRASOUND GUIDANCE FOR VASCULAR ACCESS;  Surgeon: Magda Debby SAILOR, MD;  Location: [facility] OR;  Service: Vascular;  Laterality: Right;   Family History  Problem Relation Age of Onset   Hypertension Mother    Diabetes Maternal Grandmother    Colon cancer Maternal Aunt    LABS: Lab Results  Component Value Date   CHOL 195 11/29/2017   HDL 68 11/29/2017   LDLCALC 101 (H) 11/29/2017  TRIG 164 (H) 11/29/2017   CHOLHDL 2.9 11/29/2017    Lab Results  Component Value Date   CREATININE 1.08 (H) 04/23/2024   BUN 29 (H) 04/23/2024   NA 135 04/23/2024   K 4.0 04/23/2024   CL 106 04/23/2024   CO2 19 (L) 04/23/2024   CrCl cannot be calculated (Patient's most recent lab result is older than the maximum 21 days allowed.).      Component Value Date/Time   PROT 6.9 04/21/2024 1002   ALBUMIN 3.4 (L) 04/21/2024 1002   AST 11 (L) 04/21/2024 1002   ALT 13 04/21/2024 1002   ALKPHOS  70 04/21/2024 1002   BILITOT 0.7 04/21/2024 1002    Lab Results  Component Value Date   HGBA1C 6.2 (H) 04/22/2024    ASSESSMENT & PLAN:  ***  Recommend annual influenza and COVID vaccines.   Follow up: ***  Izetta Henry, PharmD, CPP [facility] [phone]  "

## 2024-11-08 ENCOUNTER — Emergency Department (HOSPITAL_COMMUNITY)

## 2024-11-08 ENCOUNTER — Inpatient Hospital Stay (HOSPITAL_COMMUNITY)
Admission: EM | Admit: 2024-11-08 | Discharge: 2024-11-13 | DRG: 315 | Disposition: A | Attending: Family Medicine | Admitting: Family Medicine

## 2024-11-08 ENCOUNTER — Other Ambulatory Visit: Payer: Self-pay

## 2024-11-08 DIAGNOSIS — F17210 Nicotine dependence, cigarettes, uncomplicated: Secondary | ICD-10-CM | POA: Diagnosis present

## 2024-11-08 DIAGNOSIS — R739 Hyperglycemia, unspecified: Secondary | ICD-10-CM | POA: Diagnosis present

## 2024-11-08 DIAGNOSIS — N1831 Chronic kidney disease, stage 3a: Secondary | ICD-10-CM | POA: Diagnosis present

## 2024-11-08 DIAGNOSIS — I70229 Atherosclerosis of native arteries of extremities with rest pain, unspecified extremity: Secondary | ICD-10-CM | POA: Diagnosis present

## 2024-11-08 DIAGNOSIS — I10 Essential (primary) hypertension: Secondary | ICD-10-CM

## 2024-11-08 DIAGNOSIS — K8020 Calculus of gallbladder without cholecystitis without obstruction: Secondary | ICD-10-CM | POA: Diagnosis present

## 2024-11-08 DIAGNOSIS — Z800 Family history of malignant neoplasm of digestive organs: Secondary | ICD-10-CM

## 2024-11-08 DIAGNOSIS — Z79899 Other long term (current) drug therapy: Secondary | ICD-10-CM

## 2024-11-08 DIAGNOSIS — E063 Autoimmune thyroiditis: Secondary | ICD-10-CM | POA: Diagnosis present

## 2024-11-08 DIAGNOSIS — E039 Hypothyroidism, unspecified: Secondary | ICD-10-CM | POA: Insufficient documentation

## 2024-11-08 DIAGNOSIS — Z860100 Personal history of colon polyps, unspecified: Secondary | ICD-10-CM

## 2024-11-08 DIAGNOSIS — E785 Hyperlipidemia, unspecified: Secondary | ICD-10-CM | POA: Insufficient documentation

## 2024-11-08 DIAGNOSIS — Z8249 Family history of ischemic heart disease and other diseases of the circulatory system: Secondary | ICD-10-CM

## 2024-11-08 DIAGNOSIS — Z6831 Body mass index (BMI) 31.0-31.9, adult: Secondary | ICD-10-CM

## 2024-11-08 DIAGNOSIS — E669 Obesity, unspecified: Secondary | ICD-10-CM | POA: Diagnosis present

## 2024-11-08 DIAGNOSIS — Z79890 Hormone replacement therapy (postmenopausal): Secondary | ICD-10-CM

## 2024-11-08 DIAGNOSIS — Z5912 Inadequate housing utilities: Secondary | ICD-10-CM

## 2024-11-08 DIAGNOSIS — Z7901 Long term (current) use of anticoagulants: Secondary | ICD-10-CM

## 2024-11-08 DIAGNOSIS — Z833 Family history of diabetes mellitus: Secondary | ICD-10-CM

## 2024-11-08 DIAGNOSIS — Z592 Discord with neighbors, lodgers and landlord: Secondary | ICD-10-CM

## 2024-11-08 DIAGNOSIS — Z716 Tobacco abuse counseling: Secondary | ICD-10-CM

## 2024-11-08 DIAGNOSIS — I745 Embolism and thrombosis of iliac artery: Secondary | ICD-10-CM | POA: Diagnosis present

## 2024-11-08 DIAGNOSIS — Z87892 Personal history of anaphylaxis: Secondary | ICD-10-CM

## 2024-11-08 DIAGNOSIS — Z888 Allergy status to other drugs, medicaments and biological substances status: Secondary | ICD-10-CM

## 2024-11-08 DIAGNOSIS — Z7902 Long term (current) use of antithrombotics/antiplatelets: Secondary | ICD-10-CM

## 2024-11-08 DIAGNOSIS — Z7982 Long term (current) use of aspirin: Secondary | ICD-10-CM

## 2024-11-08 DIAGNOSIS — Z5982 Transportation insecurity: Secondary | ICD-10-CM

## 2024-11-08 DIAGNOSIS — I739 Peripheral vascular disease, unspecified: Secondary | ICD-10-CM | POA: Diagnosis present

## 2024-11-08 DIAGNOSIS — K219 Gastro-esophageal reflux disease without esophagitis: Secondary | ICD-10-CM | POA: Diagnosis present

## 2024-11-08 DIAGNOSIS — J449 Chronic obstructive pulmonary disease, unspecified: Secondary | ICD-10-CM | POA: Diagnosis present

## 2024-11-08 DIAGNOSIS — I70209 Unspecified atherosclerosis of native arteries of extremities, unspecified extremity: Principal | ICD-10-CM

## 2024-11-08 DIAGNOSIS — T82868A Thrombosis of vascular prosthetic devices, implants and grafts, initial encounter: Principal | ICD-10-CM | POA: Diagnosis present

## 2024-11-08 DIAGNOSIS — E782 Mixed hyperlipidemia: Secondary | ICD-10-CM | POA: Diagnosis present

## 2024-11-08 DIAGNOSIS — Z85819 Personal history of malignant neoplasm of unspecified site of lip, oral cavity, and pharynx: Secondary | ICD-10-CM

## 2024-11-08 DIAGNOSIS — I998 Other disorder of circulatory system: Secondary | ICD-10-CM

## 2024-11-08 DIAGNOSIS — Y831 Surgical operation with implant of artificial internal device as the cause of abnormal reaction of the patient, or of later complication, without mention of misadventure at the time of the procedure: Secondary | ICD-10-CM | POA: Diagnosis present

## 2024-11-08 DIAGNOSIS — I129 Hypertensive chronic kidney disease with stage 1 through stage 4 chronic kidney disease, or unspecified chronic kidney disease: Secondary | ICD-10-CM | POA: Diagnosis present

## 2024-11-08 DIAGNOSIS — M5430 Sciatica, unspecified side: Secondary | ICD-10-CM | POA: Diagnosis present

## 2024-11-08 DIAGNOSIS — N179 Acute kidney failure, unspecified: Secondary | ICD-10-CM | POA: Diagnosis present

## 2024-11-08 DIAGNOSIS — E66811 Obesity, class 1: Secondary | ICD-10-CM | POA: Diagnosis present

## 2024-11-08 DIAGNOSIS — Z884 Allergy status to anesthetic agent status: Secondary | ICD-10-CM

## 2024-11-08 DIAGNOSIS — I70222 Atherosclerosis of native arteries of extremities with rest pain, left leg: Secondary | ICD-10-CM | POA: Diagnosis present

## 2024-11-08 DIAGNOSIS — Z720 Tobacco use: Secondary | ICD-10-CM

## 2024-11-08 LAB — CBC WITH DIFFERENTIAL/PLATELET
Abs Immature Granulocytes: 0.06 10*3/uL (ref 0.00–0.07)
Basophils Absolute: 0.1 10*3/uL (ref 0.0–0.1)
Basophils Relative: 1 %
Eosinophils Absolute: 0 10*3/uL (ref 0.0–0.5)
Eosinophils Relative: 0 %
HCT: 38 % (ref 36.0–46.0)
Hemoglobin: 12.9 g/dL (ref 12.0–15.0)
Immature Granulocytes: 1 %
Lymphocytes Relative: 15 %
Lymphs Abs: 1.8 10*3/uL (ref 0.7–4.0)
MCH: 30.4 pg (ref 26.0–34.0)
MCHC: 33.9 g/dL (ref 30.0–36.0)
MCV: 89.4 fL (ref 80.0–100.0)
Monocytes Absolute: 0.7 10*3/uL (ref 0.1–1.0)
Monocytes Relative: 6 %
Neutro Abs: 9.2 10*3/uL — ABNORMAL HIGH (ref 1.7–7.7)
Neutrophils Relative %: 77 %
Platelets: 349 10*3/uL (ref 150–400)
RBC: 4.25 MIL/uL (ref 3.87–5.11)
RDW: 13.9 % (ref 11.5–15.5)
WBC: 11.9 10*3/uL — ABNORMAL HIGH (ref 4.0–10.5)
nRBC: 0 % (ref 0.0–0.2)

## 2024-11-08 LAB — I-STAT CHEM 8, ED
BUN: 26 mg/dL — ABNORMAL HIGH (ref 8–23)
Calcium, Ion: 1.1 mmol/L — ABNORMAL LOW (ref 1.15–1.40)
Chloride: 105 mmol/L (ref 98–111)
Creatinine, Ser: 1.9 mg/dL — ABNORMAL HIGH (ref 0.44–1.00)
Glucose, Bld: 213 mg/dL — ABNORMAL HIGH (ref 70–99)
HCT: 38 % (ref 36.0–46.0)
Hemoglobin: 12.9 g/dL (ref 12.0–15.0)
Potassium: 3.8 mmol/L (ref 3.5–5.1)
Sodium: 141 mmol/L (ref 135–145)
TCO2: 21 mmol/L — ABNORMAL LOW (ref 22–32)

## 2024-11-08 LAB — COMPREHENSIVE METABOLIC PANEL WITH GFR
ALT: 11 U/L (ref 0–44)
AST: 15 U/L (ref 15–41)
Albumin: 4.1 g/dL (ref 3.5–5.0)
Alkaline Phosphatase: 96 U/L (ref 38–126)
Anion gap: 18 — ABNORMAL HIGH (ref 5–15)
BUN: 23 mg/dL (ref 8–23)
CO2: 20 mmol/L — ABNORMAL LOW (ref 22–32)
Calcium: 9.8 mg/dL (ref 8.9–10.3)
Chloride: 102 mmol/L (ref 98–111)
Creatinine, Ser: 1.72 mg/dL — ABNORMAL HIGH (ref 0.44–1.00)
GFR, Estimated: 32 mL/min — ABNORMAL LOW
Glucose, Bld: 212 mg/dL — ABNORMAL HIGH (ref 70–99)
Potassium: 3.8 mmol/L (ref 3.5–5.1)
Sodium: 140 mmol/L (ref 135–145)
Total Bilirubin: 0.6 mg/dL (ref 0.0–1.2)
Total Protein: 7.4 g/dL (ref 6.5–8.1)

## 2024-11-08 LAB — I-STAT CG4 LACTIC ACID, ED: Lactic Acid, Venous: 1.8 mmol/L (ref 0.5–1.9)

## 2024-11-08 MED ORDER — HYDROMORPHONE HCL 1 MG/ML IJ SOLN
1.0000 mg | Freq: Once | INTRAMUSCULAR | Status: AC
  Administered 2024-11-08: 1 mg via INTRAVENOUS
  Filled 2024-11-08: qty 1

## 2024-11-08 MED ORDER — HEPARIN BOLUS VIA INFUSION
5000.0000 [IU] | Freq: Once | INTRAVENOUS | Status: AC
  Administered 2024-11-08: 5000 [IU] via INTRAVENOUS
  Filled 2024-11-08: qty 5000

## 2024-11-08 MED ORDER — HEPARIN (PORCINE) 25000 UT/250ML-% IV SOLN
1300.0000 [IU]/h | INTRAVENOUS | Status: DC
  Administered 2024-11-08: 1300 [IU]/h via INTRAVENOUS
  Filled 2024-11-08: qty 250

## 2024-11-08 NOTE — ED Provider Triage Note (Signed)
 Emergency Medicine Provider Triage Evaluation Note  Zoe Snyder , a 69 y.o. female  was evaluated in triage.  Pt complains of left leg pain, abdominal pain back pain and flank pain and reporting that her left leg is dead.  Review of Systems  Positive: Severe pain in abdomen back going down the left leg with it looking darker, feeling very cold and feeling numb.  She says this is how she has felt with previous clot in the leg. Negative: No fevers, chills, congestion, cough, nausea, vomiting, constipation, diarrhea, or urinary changes  Physical Exam  There were no vitals taken for this visit. Gen:   Awake, ill-appearing Resp:  Normal effort  MSK:   Reports it feels weaker, numb and it is discolored on the left leg.  It is cold compared to the right.  Pain in the left leg.  Nontender in the abdomen or flank.  Unable to find dopplerable pulse in DP or PT artery on the left.  Medical Decision Making  Medically screening exam initiated at 10:02 PM.  Appropriate orders placed.  Zoe Snyder was informed that the remainder of the evaluation will be completed by another provider, this initial triage assessment does not replace that evaluation, and the importance of remaining in the ED until their evaluation is complete.  Zoe Snyder is a 69 y.o. female with a past medical history significant for peripheral arterial disease with previous angioplasty in the left leg no longer on blood thinners, hypertension, throat cancer, Hashimoto's thyroiditis, and COPD who presents with severe left leg pain.  According to patient, about 2-1/2 hours ago she had onset of pain in her lower abdomen, low back, and going down her left leg.  She reports it is feeling numb and weak and is very cold.  She cannot feel her pulse and says this is how it felt when she had a clot in her leg.  The pain is 10 out of 10 and she is very uncomfortable and rocking in her exam chair.  She otherwise said she was at her  baseline before the pain began and denies recent fevers, chills, congestion, cough, nausea, vomiting, constipation, diarrhea, or urinary changes.       On my exam, lungs clear.  Chest nontender.  Abdomen nontender.  Back nontender.  Patient has slight mottling and discoloration to her left leg and it is cold compared to the right.  I could not find a DP or PT pulse on the small hand-held Doppler ultrasound.  Otherwise exam unremarkable.  Clinically I am concerned about critical leg ischemia.  I called Dr. Magda who is the vascular surgeon who is taking care of the patient last year who is on-call.  He agreed with heparin , getting a CT with runoff, and other labs started.  Anticipate admission and vascular surgery evaluation and management when her workup is further along.  Will order heparin  and pain medicine for her.  Will keep her NPO.   Alasdair Kleve, Lonni JINNY, MD 11/08/24 [phone]

## 2024-11-08 NOTE — ED Triage Notes (Signed)
 Pt presents via EMS c/o sudden onset of LLE pain. Reports feels like leg is dead. Unable to palpate or doppler pedal pulses in left foot. Tegeler MD notified.

## 2024-11-08 NOTE — Progress Notes (Signed)
 PHARMACY - ANTICOAGULATION CONSULT NOTE  Pharmacy Consult for IV heparin  Indication: possible DVT  Allergies[1]  Patient Measurements:    Vital Signs: Temp: 97.8 F (36.6 C) (01/28 2234) Temp Source: Oral (01/28 2234) BP: 135/83 (01/28 2234) Pulse Rate: 88 (01/28 2234)  Labs: Recent Labs    11/08/24 2138 11/08/24 2225  HGB 12.9 12.9  HCT 38.0 38.0  PLT 349  --   CREATININE  --  1.90*    CrCl cannot be calculated (Unknown ideal weight.).   Medical History: Past Medical History:  Diagnosis Date   COPD (chronic obstructive pulmonary disease) (HCC)    Hashimoto's thyroiditis    History of alcohol abuse 11/30/2017   History of cocaine abuse (HCC) 11/30/2017   History of colon polyps    Hyperlipidemia    Hypertension    Prediabetes    Throat cancer (HCC)    Tobacco use disorder      Assessment: 69 yo F presenting with left lower leg pain. Pt is on Eliquis  PTA. MD concerned for DVT. Pharmacy consulted to dose heparin .   Goal of Therapy:  Heparin  level 0.3-0.7 units/ml Monitor platelets by anticoagulation protocol: Yes   Plan:  Give 5000 units bolus x 1 Start heparin  infusion at 1300 units/hr Check anti-Xa level in 8 hours and daily while on heparin  Continue to monitor H&H and platelets  [name], PharmD PGY1 Clinical Pharmacist [facility]  11/08/2024 10:46 PM       [1]  Allergies Allergen Reactions   Epinephrine Anaphylaxis    Respiratory problems, e.g., wheezing;  Palpitations   Palpitations  Respiratory problems, e.g., wheezing;, Palpitations   Novocain [Procaine] Palpitations and Other (See Comments)    Heart racing   Theraflu Severe Cold Daytime [Dm-Phenylephrine -Acetaminophen ] Palpitations

## 2024-11-08 NOTE — ED Provider Notes (Signed)
 " [place] [facility] AT [facility] Provider Note   CSN: [id number] Arrival date & time: 11/08/24  2133     Patient presents with: Leg Pain   Zoe Snyder is a 69 y.o. female.  Patient with past medical history significant for DVT, ischemic lower limb,  HTN, COPD, left common iliac angioplasty in February of last year presents to the emergency department via EMS complaining of sudden onset of left lower extremity pain over the past few hours.  She states it feels like her left leg is dead.  Pulse was unable to be either palpated or found with Doppler.  During triage Dr. Ginger spoke with Dr. Vonzell with vascular surgery.  Plan at time of arrival is for CT angio with runoff, pain medication, heparin . Patient denying chest pain, shortness of breath, abdominal pain, nausea, vomiting.  Her anticoagulation was discontinued at the beginning of December of this year.    Leg Pain      Prior to Admission medications  Medication Sig Start Date End Date Taking? Authorizing Provider  acetaminophen  (TYLENOL ) 500 MG tablet Take 2 tablets (1,000 mg total) by mouth every 6 (six) hours. 04/24/24   Elnora Ip, MD  amLODipine  (NORVASC ) 10 MG tablet Take 1 tablet (10 mg total) by mouth daily. Due for follow up visit 08/05/21   Freddi Hamilton, MD  apixaban  (ELIQUIS ) 5 MG TABS tablet Take 1 tablet (5 mg total) by mouth 2 (two) times daily. 03/28/24   Magda Debby SAILOR, MD  aspirin  EC 81 MG tablet Take 1 tablet (81 mg total) by mouth daily at 6 (six) AM. Swallow whole. 11/20/23   Bethanie Cough, PA-C  atorvastatin  (LIPITOR) 20 MG tablet Take 20 mg by mouth at bedtime.    [provider]  Cholecalciferol  (VITAMIN D -1000 MAX ST) 25 MCG (1000 UT) tablet Take 1 tablet (1,000 Units total) by mouth daily. 08/13/23   Flint Sonny POUR, PA-C  clopidogrel  (PLAVIX ) 75 MG tablet Take 1 tablet (75 mg total) by mouth daily. 05/02/24   Magda Debby SAILOR, MD   ipratropium-albuterol  (DUONEB) 0.5-2.5 (3) MG/3ML SOLN Take 3 mLs by nebulization every 4 (four) hours as needed. 10/22/23   Joesph Shaver Scales, PA-C  levothyroxine  (SYNTHROID ) 75 MCG tablet Take 75 mcg by mouth every morning. 06/21/23   [provider]  Multiple Vitamin (MULTI-VITAMIN) tablet Take 1 tablet by mouth daily. 05/08/16   [provider]  pantoprazole  (PROTONIX ) 20 MG tablet Take 1 tablet (20 mg total) by mouth daily. 04/13/24   Bauer, Collin S, PA-C  promethazine -dextromethorphan (PROMETHAZINE -DM) 6.25-15 MG/5ML syrup Take 5 mLs by mouth at bedtime as needed for cough.    [provider]  [Paused] valsartan (DIOVAN) 80 MG tablet Take 80 mg by mouth daily. Wait to take this until your doctor or other care provider tells you to start again. 09/29/22   [provider]    Allergies: Epinephrine, Novocain [procaine], and Theraflu severe cold daytime [dm-phenylephrine -acetaminophen ]    Review of Systems  Updated Vital Signs BP 135/83 (BP Location: Right Arm)   Pulse 88   Temp 97.8 F (36.6 C) (Oral)   Resp 20   SpO2 98%   Physical Exam Vitals and nursing note reviewed.  Constitutional:      Appearance: She is well-developed.  HENT:     Head: Normocephalic and atraumatic.  Eyes:     Conjunctiva/sclera: Conjunctivae normal.  Cardiovascular:     Rate and Rhythm: Normal rate and regular rhythm.  Pulmonary:     Effort: Pulmonary effort is normal. No respiratory distress.     Breath sounds: Normal breath sounds.  Abdominal:     Palpations: Abdomen is soft.     Tenderness: There is no abdominal tenderness.  Musculoskeletal:        General: No swelling.     Cervical back: Neck supple.     Comments: Pain throughout left leg.  Unable to palpate or find left pedal pulse with Doppler.  Left foot feels cool when compared to the contralateral side.  Skin:    General: Skin is warm and dry.     Capillary Refill: Capillary refill takes less than 2  seconds.  Neurological:     Mental Status: She is alert.  Psychiatric:        Mood and Affect: Mood normal.     (all labs ordered are listed, but only abnormal results are displayed) Labs Reviewed  CBC WITH DIFFERENTIAL/PLATELET - Abnormal; Notable for the following components:      Result Value   WBC 11.9 (*)    Neutro Abs 9.2 (*)    All other components within normal limits  COMPREHENSIVE METABOLIC PANEL WITH GFR - Abnormal; Notable for the following components:   CO2 20 (*)    Glucose, Bld 212 (*)    Creatinine, Ser 1.72 (*)    GFR, Estimated 32 (*)    Anion gap 18 (*)    All other components within normal limits  I-STAT CHEM 8, ED - Abnormal; Notable for the following components:   BUN 26 (*)    Creatinine, Ser 1.90 (*)    Glucose, Bld 213 (*)    Calcium , Ion 1.10 (*)    TCO2 21 (*)    All other components within normal limits  URINALYSIS, W/ REFLEX TO CULTURE (INFECTION SUSPECTED)  HEPARIN  LEVEL (UNFRACTIONATED)  CBC  I-STAT CG4 LACTIC ACID, ED  I-STAT CG4 LACTIC ACID, ED    EKG: None  Radiology: CT Angio Aortobifemoral W and/or Wo Contrast Result Date: 11/09/2024 EXAM: CTA ABDOMEN AND PELVIS WITH AND WITHOUT CONTRAST AND RUNOFF CTA OF THE LOWER EXTREMITIES WITH CONTRAST 11/09/2024 12:12:45 AM TECHNIQUE: CTA images of the abdomen, pelvis and lower extremities with and without intravenous contrast. Three-dimensional MIP/volume rendered formations were performed. Automated exposure control, iterative reconstruction, and/or weight based adjustment of the mA/kV was utilized to reduce the radiation dose to as low as reasonably achievable. 100 mL (iohexol  (OMNIPAQUE ) 350 MG/ML injection 100 mL IOHEXOL  350 MG/ML SOLN) was administered. COMPARISON: Prior examination of 02/07/2024. CLINICAL HISTORY: Abdominal pain, back pain going down the left leg with no pulse, cold, and darkened. FINDINGS: VASCULATURE: AORTA: Moderate mixed atherosclerotic plaque within the abdominal aorta. No  aneurysm or dissection. No hemodynamically significant stenosis. CELIAC TRUNK: No acute finding. No occlusion or significant stenosis. SUPERIOR MESENTERIC ARTERY: No acute finding. No occlusion or significant stenosis. INFERIOR MESENTERIC ARTERY: The inferior mesenteric artery is diminutive. RENAL ARTERIES: Single right renal artery is widely patent and demonstrates normal vascular morphology. Single left renal artery demonstrates a 50% stenosis of its origin secondary to calcified atherosclerotic plaque. Normal vascular morphology. No aneurysm or dissection. RIGHT ILIAC ARTERIES: Extensive mixed atherosclerotic plaque noted within the right lower extremity arterial inflow. The right external iliac artery is diffusely diminutive with a luminal diameter of 3-4 mm. The right internal iliac artery is moderately diseased but is patent at its origin. RIGHT FEMORAL ARTERIES: Right lower extremity arterial outflow demonstrates no hemodynamically significant stenosis in the  common femoral artery and wide patency of the profunda femoral artery. The superficial femoral artery is diminutive and demonstrates scattered calcified atherosclerotic plaque. The vessel appears focally occluded in the adductor canal. RIGHT POPLITEAL ARTERY: The popliteal artery is diminutive but patent without hemodynamically significant stenosis. RIGHT CALF ARTERIES: There is 3-vessel runoff to the right ankle with patency of the dorsalis pedis and plantar arch vessels. LEFT ILIAC ARTERIES: The left common iliac artery has undergone previous stenting and is occluded. There is collateral filling of the right external iliac artery by the inferior epigastric artery and retrograde filling of the external iliac artery and subsequent the left internal iliac artery. The left external iliac artery is diminutive demonstrating luminal diameter of 3 mm diffusely. LEFT FEMORAL ARTERIES: On the left, the profunda femoral artery is widely patent. The superficial  femoral artery is diminutive and demonstrates scattered atherosclerotic calcification. The vessel is focally occluded within the adductor canal. LEFT POPLITEAL ARTERY: The left popliteal artery is widely patent. LEFT CALF ARTERIES: The trifurcation vessels are patent to the level of the ankle, however, the dorsalis pedis and plantar arch are not filled due to delayed filling of the left foot in relation to the right and likely the phase of contrast enhancement. Patency is not confirmed. ABDOMEN AND PELVIS: LOWER CHEST: Moderate emphysema. Bibasilar atelectasis. Small hiatal hernia. LIVER: The liver is unremarkable. GALLBLADDER AND BILE DUCTS: There is an enhancing mass involving the gallbladder fundus measuring 11 mm (102/6) which may represent a gallbladder polyp or a gallbladder carcinoma. The latter is considered less likely given that this is stable since prior examination of 02/07/2024 but is not optimally characterized on this examination. Dedicated non-emergent right upper quadrant sonography would be helpful for further evaluation. No biliary ductal dilatation. SPLEEN: The spleen is unremarkable. PANCREAS: The pancreas is unremarkable. ADRENAL GLANDS: Nodular thickening of the left adrenal gland again identified without a discrete nodule seen. KIDNEYS, URETERS AND BLADDER: No stones in the kidneys or ureters. No hydronephrosis. No evidence of perinephric or periureteral stranding. Urinary bladder is unremarkable. GI AND Bowel: Severe descending and sigmoid colonic diverticulosis without superimposed acute inflammatory change. Appendix normal. The stomach, small bowel, and large bowel are otherwise unremarkable. There is no bowel obstruction. No abnormal bowel wall thickening or distension. REPRODUCTIVE: Reproductive organs are unremarkable. PERITONEUM AND RETROPERITONEUM: No ascites or free air. LYMPH NODES: No evidence of lymphadenopathy. BONES AND SOFT TISSUES: 20 fat-containing left inguinal hernia and  umbilical hernia. No acute abnormality of the bones. IMPRESSION: 1. Extensive mixed atherosclerotic plaque in the right lower extremity arterial inflow with a diminutive right external iliac artery (3-4 mm) and moderate disease of the right internal iliac artery. 2. 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 3. Right lower extremity arterial outflow: Focal occlusion of the superficial femoral artery in the adductor canal. 4. Right lower extremity runoff: 3-vessel runoff to the right ankle with patency of the dorsalis pedis and plantar arch vessels. 5. Left lower extremity arterial outflow: Focal occlusion of the superficial femoral artery in the adductor canal. 6. Left lower extremity runoff: Delayed filling of the left foot with non-confirmation of dorsalis pedis and plantar arch vessel patency. 7. Stable 11 mm enhancing mass in the gallbladder fundus, possibly representing a gallbladder polyp or carcinoma. Further evaluation with dedicated non-emergent right upper quadrant sonography is recommended. 8. Left renal artery origin 50% stenosis secondary to calcified  atherosclerotic plaque. 9. Severe descending and sigmoid colonic diverticulosis without superimposed acute inflammatory change. 10. Pulmonary emphysema is an independent risk factor for lung cancer. Recommend consideration for evaluation for a low-dose CT lung cancer screening program. Electronically signed by: Dorethia Molt MD 11/09/2024 12:44 AM EST RP Workstation: [id number]     .Critical Care  Performed by: Logan Ubaldo NOVAK, PA-C Authorized by: Logan Ubaldo NOVAK, PA-C   Critical care provider statement:    Critical care time (minutes):  80   Critical care time was exclusive of:  Separately billable procedures and treating other patients   Critical care was necessary to treat or prevent imminent or  life-threatening deterioration of the following conditions:  Circulatory failure (Critical limb ischemia requiring heparin )   Critical care was time spent personally by me on the following activities:  Development of treatment plan with patient or surrogate, discussions with consultants, evaluation of patient's response to treatment, examination of patient, ordering and review of laboratory studies, ordering and review of radiographic studies, ordering and performing treatments and interventions, pulse oximetry, re-evaluation of patient's condition and review of old charts    Medications Ordered in the ED  heparin  ADULT infusion 100 units/mL (25000 units/250mL) (1,300 Units/hr Intravenous New Bag/Given 11/08/24 2325)  sodium chloride  0.9 % bolus 1,000 mL (has no administration in time range)  HYDROmorphone  (DILAUDID ) injection 1 mg (1 mg Intravenous Given 11/08/24 2205)  heparin  bolus via infusion 5,000 Units (5,000 Units Intravenous Bolus from Bag 11/08/24 2325)  iohexol  (OMNIPAQUE ) 350 MG/ML injection 100 mL (100 mLs Intravenous Contrast Given 11/09/24 0013)                                    Medical Decision Making Amount and/or Complexity of Data Reviewed Labs: ordered.  Risk Prescription drug management. Decision regarding hospitalization.   This patient presents to the ED for concern of left leg pain, this involves an extensive number of treatment options, and is a complaint that carries with it a high risk of complications and morbidity.  The differential diagnosis includes limb ischemia, DVT, musculoskeletal pain, others   Co morbidities / Chronic conditions that complicate the patient evaluation  As noted in HPI   Additional history obtained:  Additional history obtained from EMR External records from outside source obtained and reviewed including cardiology notes noting discontinuation of anticoagulation in late 2025   Lab Tests:  I Ordered, and personally interpreted  labs.  The pertinent results include:  Creatinine 1.72 (was 1.08 six months ago)   Imaging Studies ordered:  I ordered imaging studies including CT angio aortobifemoral with and without contrast I independently visualized and interpreted imaging which showed  1. Extensive mixed atherosclerotic plaque in the right lower extremity arterial  inflow with a diminutive right external iliac artery (3-4 mm) and moderate  disease of the right internal iliac artery.  2. 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  3. Right lower extremity arterial outflow: Focal occlusion of the superficial  femoral artery in the adductor canal.  4. Right lower extremity runoff: 3-vessel runoff to the right ankle with  patency of the dorsalis pedis and plantar arch vessels.  5. Left lower extremity arterial outflow: Focal occlusion of the superficial  femoral artery in the adductor canal.  6. Left lower extremity runoff: Delayed  filling of the left foot with  non-confirmation of dorsalis pedis and plantar arch vessel patency.  7. Stable 11 mm enhancing mass in the gallbladder fundus, possibly representing  a gallbladder polyp or carcinoma. Further evaluation with dedicated  non-emergent right upper quadrant sonography is recommended.  8. Left renal artery origin 50% stenosis secondary to calcified atherosclerotic  plaque.  9. Severe descending and sigmoid colonic diverticulosis without superimposed  acute inflammatory change.  10. Pulmonary emphysema is an independent risk factor for lung cancer.   I agree with the radiologist interpretation   Problem List / ED Course / Critical interventions / Medication management   I ordered medication including Dilaudid , heparin  Reevaluation of the patient after these medicines showed that the patient improved   Consultations  Obtained:  I requested consultation with the vascular surgeon, Dr. Magda,  and discussed lab and imaging findings as well as pertinent plan - they recommend: Continue with heparin , keep patient n.p.o., admit to medicine, plan for likely intervention in a.m.  Requested consultation with the medicine team, Dr.Mansy agrees to admit   Social Determinants of Health:  Patient is a daily smoker   Test / Admission - Considered:  Patient with occluded left common iliac artery with multiple other findings on the CT angio study.  Patient currently receiving heparin .  Will need intervention by vascular surgery.  Patient to be admitted to medicine for continued medical management.      Final diagnoses:  Arterial occlusion, lower extremity  AKI (acute kidney injury)    ED Discharge Orders     None          Logan Ubaldo KATHEE DEVONNA 11/09/24 0240    Dasie Faden, MD 11/10/24 1158  "

## 2024-11-08 NOTE — ED Provider Notes (Signed)
 Patient with decreased GFR of 29. Patient with evidence of critical limb ischemia. Benefit of CT scan with contrast outweighs risk at this time.    Zoe Snyder Zoe Snyder Zoe Snyder 11/08/24 2243    Zoe Faden, MD 11/10/24 1158

## 2024-11-09 ENCOUNTER — Inpatient Hospital Stay (HOSPITAL_COMMUNITY)

## 2024-11-09 ENCOUNTER — Encounter (HOSPITAL_COMMUNITY): Admission: EM | Disposition: A | Payer: Self-pay | Source: Home / Self Care

## 2024-11-09 ENCOUNTER — Emergency Department (HOSPITAL_COMMUNITY)

## 2024-11-09 DIAGNOSIS — I745 Embolism and thrombosis of iliac artery: Secondary | ICD-10-CM | POA: Diagnosis present

## 2024-11-09 DIAGNOSIS — Y831 Surgical operation with implant of artificial internal device as the cause of abnormal reaction of the patient, or of later complication, without mention of misadventure at the time of the procedure: Secondary | ICD-10-CM | POA: Diagnosis present

## 2024-11-09 DIAGNOSIS — Z79899 Other long term (current) drug therapy: Secondary | ICD-10-CM | POA: Diagnosis not present

## 2024-11-09 DIAGNOSIS — I70209 Unspecified atherosclerosis of native arteries of extremities, unspecified extremity: Secondary | ICD-10-CM | POA: Diagnosis not present

## 2024-11-09 DIAGNOSIS — Z720 Tobacco use: Secondary | ICD-10-CM

## 2024-11-09 DIAGNOSIS — Z5912 Inadequate housing utilities: Secondary | ICD-10-CM | POA: Diagnosis not present

## 2024-11-09 DIAGNOSIS — Z884 Allergy status to anesthetic agent status: Secondary | ICD-10-CM | POA: Diagnosis not present

## 2024-11-09 DIAGNOSIS — E039 Hypothyroidism, unspecified: Secondary | ICD-10-CM | POA: Diagnosis not present

## 2024-11-09 DIAGNOSIS — K219 Gastro-esophageal reflux disease without esophagitis: Secondary | ICD-10-CM | POA: Insufficient documentation

## 2024-11-09 DIAGNOSIS — R739 Hyperglycemia, unspecified: Secondary | ICD-10-CM | POA: Diagnosis not present

## 2024-11-09 DIAGNOSIS — I998 Other disorder of circulatory system: Secondary | ICD-10-CM

## 2024-11-09 DIAGNOSIS — T82868A Thrombosis of vascular prosthetic devices, implants and grafts, initial encounter: Secondary | ICD-10-CM | POA: Diagnosis present

## 2024-11-09 DIAGNOSIS — Z7902 Long term (current) use of antithrombotics/antiplatelets: Secondary | ICD-10-CM | POA: Diagnosis not present

## 2024-11-09 DIAGNOSIS — I10 Essential (primary) hypertension: Secondary | ICD-10-CM | POA: Diagnosis not present

## 2024-11-09 DIAGNOSIS — N1831 Chronic kidney disease, stage 3a: Secondary | ICD-10-CM | POA: Diagnosis present

## 2024-11-09 DIAGNOSIS — I129 Hypertensive chronic kidney disease with stage 1 through stage 4 chronic kidney disease, or unspecified chronic kidney disease: Secondary | ICD-10-CM | POA: Diagnosis present

## 2024-11-09 DIAGNOSIS — N179 Acute kidney failure, unspecified: Secondary | ICD-10-CM | POA: Diagnosis present

## 2024-11-09 DIAGNOSIS — E785 Hyperlipidemia, unspecified: Secondary | ICD-10-CM | POA: Diagnosis not present

## 2024-11-09 DIAGNOSIS — I70222 Atherosclerosis of native arteries of extremities with rest pain, left leg: Secondary | ICD-10-CM | POA: Diagnosis present

## 2024-11-09 DIAGNOSIS — F17210 Nicotine dependence, cigarettes, uncomplicated: Secondary | ICD-10-CM | POA: Diagnosis present

## 2024-11-09 DIAGNOSIS — Z7982 Long term (current) use of aspirin: Secondary | ICD-10-CM | POA: Diagnosis not present

## 2024-11-09 DIAGNOSIS — J449 Chronic obstructive pulmonary disease, unspecified: Secondary | ICD-10-CM | POA: Diagnosis present

## 2024-11-09 DIAGNOSIS — Z833 Family history of diabetes mellitus: Secondary | ICD-10-CM | POA: Diagnosis not present

## 2024-11-09 DIAGNOSIS — Z592 Discord with neighbors, lodgers and landlord: Secondary | ICD-10-CM | POA: Diagnosis not present

## 2024-11-09 DIAGNOSIS — Z888 Allergy status to other drugs, medicaments and biological substances status: Secondary | ICD-10-CM | POA: Diagnosis not present

## 2024-11-09 DIAGNOSIS — E782 Mixed hyperlipidemia: Secondary | ICD-10-CM | POA: Diagnosis present

## 2024-11-09 DIAGNOSIS — E063 Autoimmune thyroiditis: Secondary | ICD-10-CM | POA: Diagnosis present

## 2024-11-09 DIAGNOSIS — Z79890 Hormone replacement therapy (postmenopausal): Secondary | ICD-10-CM | POA: Diagnosis not present

## 2024-11-09 DIAGNOSIS — Z7901 Long term (current) use of anticoagulants: Secondary | ICD-10-CM | POA: Diagnosis not present

## 2024-11-09 DIAGNOSIS — I70229 Atherosclerosis of native arteries of extremities with rest pain, unspecified extremity: Secondary | ICD-10-CM | POA: Diagnosis not present

## 2024-11-09 DIAGNOSIS — Z6831 Body mass index (BMI) 31.0-31.9, adult: Secondary | ICD-10-CM | POA: Diagnosis not present

## 2024-11-09 DIAGNOSIS — E66811 Obesity, class 1: Secondary | ICD-10-CM | POA: Diagnosis present

## 2024-11-09 DIAGNOSIS — Z8249 Family history of ischemic heart disease and other diseases of the circulatory system: Secondary | ICD-10-CM | POA: Diagnosis not present

## 2024-11-09 LAB — BASIC METABOLIC PANEL WITH GFR
Anion gap: 10 (ref 5–15)
Anion gap: 10 (ref 5–15)
BUN: 21 mg/dL (ref 8–23)
BUN: 15 mg/dL (ref 8–23)
CO2: 21 mmol/L — ABNORMAL LOW (ref 22–32)
CO2: 20 mmol/L — ABNORMAL LOW (ref 22–32)
Calcium: 8.9 mg/dL (ref 8.9–10.3)
Calcium: 8.2 mg/dL — ABNORMAL LOW (ref 8.9–10.3)
Chloride: 105 mmol/L (ref 98–111)
Chloride: 109 mmol/L (ref 98–111)
Creatinine, Ser: 1.23 mg/dL — ABNORMAL HIGH (ref 0.44–1.00)
Creatinine, Ser: 1.05 mg/dL — ABNORMAL HIGH (ref 0.44–1.00)
GFR, Estimated: 48 mL/min — ABNORMAL LOW
GFR, Estimated: 58 mL/min — ABNORMAL LOW
Glucose, Bld: 126 mg/dL — ABNORMAL HIGH (ref 70–99)
Glucose, Bld: 119 mg/dL — ABNORMAL HIGH (ref 70–99)
Potassium: 4.3 mmol/L (ref 3.5–5.1)
Potassium: 3.5 mmol/L (ref 3.5–5.1)
Sodium: 136 mmol/L (ref 135–145)
Sodium: 139 mmol/L (ref 135–145)

## 2024-11-09 LAB — CBC
HCT: 34.1 % — ABNORMAL LOW (ref 36.0–46.0)
HCT: 32.5 % — ABNORMAL LOW (ref 36.0–46.0)
HCT: 31.6 % — ABNORMAL LOW (ref 36.0–46.0)
Hemoglobin: 11.7 g/dL — ABNORMAL LOW (ref 12.0–15.0)
Hemoglobin: 10.9 g/dL — ABNORMAL LOW (ref 12.0–15.0)
Hemoglobin: 10.6 g/dL — ABNORMAL LOW (ref 12.0–15.0)
MCH: 30.5 pg (ref 26.0–34.0)
MCH: 30.6 pg (ref 26.0–34.0)
MCH: 30.6 pg (ref 26.0–34.0)
MCHC: 34.3 g/dL (ref 30.0–36.0)
MCHC: 33.5 g/dL (ref 30.0–36.0)
MCHC: 33.5 g/dL (ref 30.0–36.0)
MCV: 88.8 fL (ref 80.0–100.0)
MCV: 91.3 fL (ref 80.0–100.0)
MCV: 91.3 fL (ref 80.0–100.0)
Platelets: 262 10*3/uL (ref 150–400)
Platelets: 244 10*3/uL (ref 150–400)
Platelets: 206 10*3/uL (ref 150–400)
RBC: 3.84 MIL/uL — ABNORMAL LOW (ref 3.87–5.11)
RBC: 3.56 MIL/uL — ABNORMAL LOW (ref 3.87–5.11)
RBC: 3.46 MIL/uL — ABNORMAL LOW (ref 3.87–5.11)
RDW: 14.1 % (ref 11.5–15.5)
RDW: 14.2 % (ref 11.5–15.5)
RDW: 14.3 % (ref 11.5–15.5)
WBC: 9.3 10*3/uL (ref 4.0–10.5)
WBC: 8.6 10*3/uL (ref 4.0–10.5)
WBC: 7.4 10*3/uL (ref 4.0–10.5)
nRBC: 0 % (ref 0.0–0.2)
nRBC: 0 % (ref 0.0–0.2)
nRBC: 0 % (ref 0.0–0.2)

## 2024-11-09 LAB — URINALYSIS, W/ REFLEX TO CULTURE (INFECTION SUSPECTED)
Bacteria, UA: NONE SEEN
Bilirubin Urine: NEGATIVE
Glucose, UA: NEGATIVE mg/dL
Ketones, ur: NEGATIVE mg/dL
Nitrite: NEGATIVE
Protein, ur: NEGATIVE mg/dL
Specific Gravity, Urine: 1.051 — ABNORMAL HIGH (ref 1.005–1.030)
pH: 5 (ref 5.0–8.0)

## 2024-11-09 LAB — HEPARIN LEVEL (UNFRACTIONATED)
Heparin Unfractionated: >1.1 [IU]/mL — ABNORMAL HIGH (ref 0.30–0.70)
Heparin Unfractionated: >1.1 [IU]/mL — ABNORMAL HIGH (ref 0.30–0.70)
Heparin Unfractionated: 0.98 [IU]/mL — ABNORMAL HIGH (ref 0.30–0.70)
Heparin Unfractionated: 0.54 [IU]/mL (ref 0.30–0.70)

## 2024-11-09 LAB — FIBRINOGEN: Fibrinogen: 383 mg/dL (ref 210–475)

## 2024-11-09 LAB — POCT ACTIVATED CLOTTING TIME: Activated Clotting Time: 189 s

## 2024-11-09 LAB — GLUCOSE, CAPILLARY
Glucose-Capillary: 165 mg/dL — ABNORMAL HIGH (ref 70–99)
Glucose-Capillary: 121 mg/dL — ABNORMAL HIGH (ref 70–99)

## 2024-11-09 LAB — I-STAT CG4 LACTIC ACID, ED: Lactic Acid, Venous: 0.7 mmol/L (ref 0.5–1.9)

## 2024-11-09 LAB — MRSA NEXT GEN BY PCR, NASAL: MRSA by PCR Next Gen: NOT DETECTED

## 2024-11-09 LAB — APTT: aPTT: 176 s (ref 24–36)

## 2024-11-09 MED ORDER — IOHEXOL 350 MG/ML SOLN
100.0000 mL | Freq: Once | INTRAVENOUS | Status: AC | PRN
  Administered 2024-11-09: 100 mL via INTRAVENOUS

## 2024-11-09 MED ORDER — ASPIRIN 81 MG PO TBEC
81.0000 mg | DELAYED_RELEASE_TABLET | Freq: Every day | ORAL | Status: DC
  Administered 2024-11-09 – 2024-11-10 (×2): 81 mg via ORAL
  Filled 2024-11-09 (×2): qty 1

## 2024-11-09 MED ORDER — ADULT MULTIVITAMIN W/MINERALS CH
1.0000 | ORAL_TABLET | Freq: Every day | ORAL | Status: DC
  Administered 2024-11-09 – 2024-11-13 (×5): 1 via ORAL
  Filled 2024-11-09 (×5): qty 1

## 2024-11-09 MED ORDER — SODIUM CHLORIDE 0.9 % IV BOLUS
1000.0000 mL | Freq: Once | INTRAVENOUS | Status: AC
  Administered 2024-11-09: 1000 mL via INTRAVENOUS

## 2024-11-09 MED ORDER — SODIUM CHLORIDE 0.9 % IV SOLN: INTRAVENOUS | Status: DC

## 2024-11-09 MED ORDER — SODIUM CHLORIDE 0.9% FLUSH: 3.0000 mL | INTRAVENOUS | Status: DC | PRN

## 2024-11-09 MED ORDER — ACETAMINOPHEN 650 MG RE SUPP: 650.0000 mg | Freq: Four times a day (QID) | RECTAL | Status: DC | PRN

## 2024-11-09 MED ORDER — PANTOPRAZOLE SODIUM 20 MG PO TBEC
20.0000 mg | DELAYED_RELEASE_TABLET | Freq: Every day | ORAL | Status: DC
  Administered 2024-11-09 – 2024-11-13 (×5): 20 mg via ORAL
  Filled 2024-11-09 (×5): qty 1

## 2024-11-09 MED ORDER — LIDOCAINE HCL (PF) 1 % IJ SOLN
INTRAMUSCULAR | Status: DC | PRN
  Administered 2024-11-09: 15 mL

## 2024-11-09 MED ORDER — ATORVASTATIN CALCIUM 10 MG PO TABS
20.0000 mg | ORAL_TABLET | Freq: Every day | ORAL | Status: DC
  Administered 2024-11-09: 20 mg via ORAL
  Filled 2024-11-09: qty 2

## 2024-11-09 MED ORDER — SODIUM CHLORIDE 0.9 % IV SOLN
1.0000 mg/h | INTRAVENOUS | Status: DC
  Administered 2024-11-09 – 2024-11-10 (×3): 1 mg/h
  Filled 2024-11-09 (×6): qty 10

## 2024-11-09 MED ORDER — HEPARIN SODIUM (PORCINE) 1000 UNIT/ML IJ SOLN
INTRAMUSCULAR | Status: AC
  Filled 2024-11-09: qty 10

## 2024-11-09 MED ORDER — ACETAMINOPHEN 325 MG PO TABS: 650.0000 mg | ORAL_TABLET | Freq: Four times a day (QID) | ORAL | Status: DC | PRN

## 2024-11-09 MED ORDER — OXYCODONE HCL 5 MG PO TABS: 5.0000 mg | ORAL_TABLET | ORAL | Status: DC | PRN

## 2024-11-09 MED ORDER — LEVOTHYROXINE SODIUM 75 MCG PO TABS
75.0000 ug | ORAL_TABLET | Freq: Every morning | ORAL | Status: DC
  Administered 2024-11-09 – 2024-11-13 (×5): 75 ug via ORAL
  Filled 2024-11-09 (×5): qty 1

## 2024-11-09 MED ORDER — VITAMIN D 25 MCG (1000 UNIT) PO TABS
1000.0000 [IU] | ORAL_TABLET | Freq: Every day | ORAL | Status: DC
  Administered 2024-11-09 – 2024-11-13 (×5): 1000 [IU] via ORAL
  Filled 2024-11-09 (×5): qty 1

## 2024-11-09 MED ORDER — HEPARIN (PORCINE) 25000 UT/250ML-% IV SOLN
800.0000 [IU]/h | INTRAVENOUS | Status: DC
  Administered 2024-11-09: 800 [IU]/h via INTRAVENOUS
  Filled 2024-11-09: qty 250

## 2024-11-09 MED ORDER — SODIUM CHLORIDE 0.9% FLUSH
3.0000 mL | Freq: Two times a day (BID) | INTRAVENOUS | Status: DC
  Administered 2024-11-10 – 2024-11-13 (×7): 3 mL via INTRAVENOUS

## 2024-11-09 MED ORDER — MIDAZOLAM HCL (PF) 2 MG/2ML IJ SOLN
INTRAMUSCULAR | Status: DC | PRN
  Administered 2024-11-09: 1 mg via INTRAVENOUS

## 2024-11-09 MED ORDER — SODIUM CHLORIDE 0.9 % IV SOLN: INTRAVENOUS | Status: AC

## 2024-11-09 MED ORDER — MAGNESIUM HYDROXIDE 400 MG/5ML PO SUSP: 30.0000 mL | Freq: Every day | ORAL | Status: DC | PRN

## 2024-11-09 MED ORDER — IPRATROPIUM-ALBUTEROL 0.5-2.5 (3) MG/3ML IN SOLN: 3.0000 mL | RESPIRATORY_TRACT | Status: DC | PRN

## 2024-11-09 MED ORDER — FENTANYL CITRATE (PF) 100 MCG/2ML IJ SOLN
INTRAMUSCULAR | Status: AC
  Filled 2024-11-09: qty 2

## 2024-11-09 MED ORDER — ONDANSETRON HCL 4 MG PO TABS: 4.0000 mg | ORAL_TABLET | Freq: Four times a day (QID) | ORAL | Status: DC | PRN

## 2024-11-09 MED ORDER — ONDANSETRON HCL 4 MG/2ML IJ SOLN: 4.0000 mg | Freq: Four times a day (QID) | INTRAMUSCULAR | Status: DC | PRN

## 2024-11-09 MED ORDER — APIXABAN 5 MG PO TABS: 5.0000 mg | ORAL_TABLET | Freq: Two times a day (BID) | ORAL | Status: DC

## 2024-11-09 MED ORDER — HEPARIN (PORCINE) IN NACL 1000-0.9 UT/500ML-% IV SOLN
INTRAVENOUS | Status: DC | PRN
  Administered 2024-11-09 (×2): 500 mL

## 2024-11-09 MED ORDER — MIDAZOLAM HCL 2 MG/2ML IJ SOLN
INTRAMUSCULAR | Status: AC
  Filled 2024-11-09: qty 2

## 2024-11-09 MED ORDER — SODIUM CHLORIDE 0.9 % IV SOLN: 250.0000 mL | INTRAVENOUS | Status: AC | PRN

## 2024-11-09 MED ORDER — MORPHINE SULFATE (PF) 2 MG/ML IV SOLN: 2.0000 mg | INTRAVENOUS | Status: DC | PRN

## 2024-11-09 MED ORDER — MORPHINE SULFATE (PF) 4 MG/ML IV SOLN
4.0000 mg | Freq: Once | INTRAVENOUS | Status: AC
  Administered 2024-11-09: 4 mg via INTRAVENOUS
  Filled 2024-11-09: qty 1

## 2024-11-09 MED ORDER — AMLODIPINE BESYLATE 10 MG PO TABS
10.0000 mg | ORAL_TABLET | Freq: Every day | ORAL | Status: DC
  Administered 2024-11-09 – 2024-11-13 (×5): 10 mg via ORAL
  Filled 2024-11-09: qty 1
  Filled 2024-11-09: qty 2
  Filled 2024-11-09 (×3): qty 1

## 2024-11-09 MED ORDER — HEPARIN SODIUM (PORCINE) 1000 UNIT/ML IJ SOLN
INTRAMUSCULAR | Status: DC | PRN
  Administered 2024-11-09 (×2): 3000 [IU] via INTRAVENOUS

## 2024-11-09 MED ORDER — LIDOCAINE HCL (PF) 1 % IJ SOLN
INTRAMUSCULAR | Status: AC
  Filled 2024-11-09: qty 30

## 2024-11-09 MED ORDER — TRAZODONE HCL 50 MG PO TABS
25.0000 mg | ORAL_TABLET | Freq: Every evening | ORAL | Status: DC | PRN
  Filled 2024-11-09: qty 1

## 2024-11-09 MED ORDER — CLOPIDOGREL BISULFATE 75 MG PO TABS: 75.0000 mg | ORAL_TABLET | Freq: Every day | ORAL | Status: DC

## 2024-11-09 MED ORDER — FENTANYL CITRATE (PF) 100 MCG/2ML IJ SOLN
INTRAMUSCULAR | Status: DC | PRN
  Administered 2024-11-09: 25 ug via INTRAVENOUS

## 2024-11-09 MED ORDER — HEPARIN (PORCINE) 25000 UT/250ML-% IV SOLN
1000.0000 [IU]/h | INTRAVENOUS | Status: DC
  Administered 2024-11-09: 1000 [IU]/h via INTRAVENOUS
  Filled 2024-11-09: qty 250

## 2024-11-09 MED ORDER — CHLORHEXIDINE GLUCONATE CLOTH 2 % EX PADS
6.0000 | MEDICATED_PAD | Freq: Every day | CUTANEOUS | Status: AC
  Administered 2024-11-09 – 2024-11-11 (×3): 6 via TOPICAL

## 2024-11-09 MED ORDER — INSULIN ASPART 100 UNIT/ML IJ SOLN
1.0000 [IU] | INTRAMUSCULAR | Status: DC
  Administered 2024-11-09: 2 [IU] via SUBCUTANEOUS
  Administered 2024-11-09 – 2024-11-10 (×3): 1 [IU] via SUBCUTANEOUS
  Administered 2024-11-10: 2 [IU] via SUBCUTANEOUS
  Administered 2024-11-11: 1 [IU] via SUBCUTANEOUS
  Administered 2024-11-11: 2 [IU] via SUBCUTANEOUS
  Administered 2024-11-11: 1 [IU] via SUBCUTANEOUS
  Administered 2024-11-11: 2 [IU] via SUBCUTANEOUS
  Administered 2024-11-11: 1 [IU] via SUBCUTANEOUS
  Administered 2024-11-12: 2 [IU] via SUBCUTANEOUS
  Filled 2024-11-09: qty 1
  Filled 2024-11-09: qty 3
  Filled 2024-11-09: qty 1
  Filled 2024-11-09 (×2): qty 2
  Filled 2024-11-09 (×2): qty 1
  Filled 2024-11-09: qty 2
  Filled 2024-11-09 (×2): qty 1
  Filled 2024-11-09: qty 2

## 2024-11-09 NOTE — H&P (Signed)
 "     [place]   PATIENT NAME: Zoe Snyder    MR#:  [id number]  DATE OF BIRTH:  12-Nov-1955  DATE OF ADMISSION:  11/08/2024  PRIMARY CARE PHYSICIAN: [facility] [address]   Patient is coming from: Home  REQUESTING/REFERRING PHYSICIAN: Logan Ubaldo NOVAK, PA-C   CHIEF COMPLAINT:   Chief Complaint  Patient presents with   Leg Pain    HISTORY OF PRESENT ILLNESS:  Zoe Snyder is a 69 y.o. female with medical history significant for COPD, Hashimoto's thyroiditis, hypertension, peripheral arterial disease status post left common iliac artery angioplasty and stent, dyslipidemia, prediabetes, and throat cancer s/p resection, who presented to the emergency room with acute onset of left leg pain over the last few hours.  This started around 5 PM when she was getting up to get the mail and then went upstairs at her home.  It started with left upper back pain with radiation down her leg likely usual sciatica with associated numbness and later throbbing.  She did not notice discoloration of her legs or feet.  She stated that her left lower leg feels like it is frozen.  Dr.  Vonzell with vascular surgery was contacted when she was in triage and recommended CT angiogram with runoff, IV heparin  and pain medications.  The patient denied any chest pain or palpitations.  No cough or wheezing or dyspnea.  No bleeding diathesis.  No fever or chills.  No nausea or vomiting or abdominal pain.  No dysuria, oliguria or hematuria or flank pain.  She has been on anticoagulation that was discontinued at the beginning of December.  ED Course: When she came to the ER, vital signs were within normal.  Labs revealed WBCs of 11.9 with neutrophilia and blood glucose of 212 with a creatinine 1.72 up from previous levels with anion gap of 18.  Lactic acid was 1.8 and later 0.7. EKG as reviewed by me :  Imaging: CT angiogram of the lower extremities with and without angiogram revealed the following: 1. Extensive  mixed atherosclerotic plaque in the right lower extremity arterial  inflow with a diminutive right external iliac artery (3-4 mm) and moderate  disease of the right internal iliac artery.  2. 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  3. Right lower extremity arterial outflow: Focal occlusion of the superficial  femoral artery in the adductor canal.  4. Right lower extremity runoff: 3-vessel runoff to the right ankle with  patency of the dorsalis pedis and plantar arch vessels.  5. Left lower extremity arterial outflow: Focal occlusion of the superficial  femoral artery in the adductor canal.  6. Left lower extremity runoff: Delayed filling of the left foot with  non-confirmation of dorsalis pedis and plantar arch vessel patency.  7. Stable 11 mm enhancing mass in the gallbladder fundus, possibly representing  a gallbladder polyp or carcinoma. Further evaluation with dedicated  non-emergent right upper quadrant sonography is recommended.  8. Left renal artery origin 50% stenosis secondary to calcified atherosclerotic  plaque.  9. Severe descending and sigmoid colonic diverticulosis without superimposed  acute inflammatory change.  10. Pulmonary emphysema is an independent risk factor for lung cancer.  Recommend consideration for evaluation for a low-dose CT lung cancer screening  program.  The patient was given 1 L bolus of IV normal saline, 1 mg of IV Dilaudid , IV heparin  bolus  and infusion.  She will be admitted to telemetry bed for further evaluation and management. PAST MEDICAL HISTORY:   Past Medical History:  Diagnosis Date   COPD (chronic obstructive pulmonary disease) (HCC)    Hashimoto's thyroiditis    History of alcohol abuse 11/30/2017   History of cocaine abuse (HCC) 11/30/2017   History of colon polyps    Hyperlipidemia     Hypertension    Prediabetes    Throat cancer (HCC)    Tobacco use disorder     PAST SURGICAL HISTORY:   Past Surgical History:  Procedure Laterality Date   ANGIOPLASTY  11/18/2023   Procedure: LEFT COMMON ILIAC ANGIOPLASTY USING VIABAHN 7 MM X AND 7 MM X 59 MM STENTS;  Surgeon: Magda Debby SAILOR, MD;  Location: [facility] OR;  Service: Vascular;;   AORTOGRAM  11/18/2023   Procedure: AORTOGRAM;  Surgeon: Magda Debby SAILOR, MD;  Location: [facility] OR;  Service: Vascular;;   COLONOSCOPY W/ POLYPECTOMY  2016   GROIN EXPOSURE Left 11/18/2023   Procedure: LEFT GROIN AND FEMORAL ARTERY EXPOSURE;  Surgeon: Magda Debby SAILOR, MD;  Location: [facility] OR;  Service: Vascular;  Laterality: Left;   INCISION AND DRAINAGE OF WOUND Left 04/22/2024   Procedure: IRRIGATION AND DEBRIDEMENT WOUND;  Surgeon: Romona Harari, MD;  Location: [facility] OR;  Service: Orthopedics;  Laterality: Left;  Left Hand Possible Forearm   THROAT SURGERY  2018   cancer   ULTRASOUND GUIDANCE FOR VASCULAR ACCESS Right 11/18/2023   Procedure: ULTRASOUND GUIDANCE FOR VASCULAR ACCESS;  Surgeon: Magda Debby SAILOR, MD;  Location: [facility] OR;  Service: Vascular;  Laterality: Right;    SOCIAL HISTORY:   Social History   Tobacco Use   Smoking status: Every Day    Current packs/day: 0.50    Average packs/day: 0.5 packs/day for 45.0 years (22.5 ttl pk-yrs)    Types: Cigarettes   Smokeless tobacco: Never   Tobacco comments:    currently smoking 0.5 ppd, but she has cut down from over 1 ppd  Substance Use Topics   Alcohol use: Yes    FAMILY HISTORY:   Family History  Problem Relation Age of Onset   Hypertension Mother    Diabetes Maternal Grandmother    Colon cancer Maternal Aunt     DRUG ALLERGIES:  Allergies[1]  REVIEW OF SYSTEMS:   ROS As per history of present illness. All pertinent systems were reviewed above. Constitutional, HEENT, cardiovascular, respiratory, GI, GU, musculoskeletal, neuro, psychiatric, endocrine, integumentary and  hematologic systems were reviewed and are otherwise negative/unremarkable except for positive findings mentioned above in the HPI.   MEDICATIONS AT HOME:   Prior to Admission medications  Medication Sig Start Date End Date Taking? Authorizing Provider  acetaminophen  (TYLENOL ) 500 MG tablet Take 2 tablets (1,000 mg total) by mouth every 6 (six) hours. 04/24/24   Elnora Ip, MD  amLODipine  (NORVASC ) 10 MG tablet Take 1 tablet (10 mg total) by mouth daily. Due for follow up visit 08/05/21   Freddi Hamilton, MD  apixaban  (ELIQUIS ) 5 MG TABS tablet Take 1 tablet (5 mg total) by mouth 2 (two) times daily. 03/28/24   Magda Debby SAILOR, MD  aspirin  EC 81 MG tablet Take 1 tablet (81 mg total) by mouth daily at 6 (six) AM. Swallow whole. 11/20/23   Bethanie Cough, PA-C  atorvastatin  (LIPITOR) 20 MG tablet Take 20 mg by mouth at bedtime.    [provider]  Cholecalciferol  (VITAMIN D -1000 MAX ST) 25 MCG (1000 UT) tablet Take 1  tablet (1,000 Units total) by mouth daily. 08/13/23   Sofia, Leslie K, PA-C  clopidogrel  (PLAVIX ) 75 MG tablet Take 1 tablet (75 mg total) by mouth daily. 05/02/24   Magda Debby SAILOR, MD  ipratropium-albuterol  (DUONEB) 0.5-2.5 (3) MG/3ML SOLN Take 3 mLs by nebulization every 4 (four) hours as needed. 10/22/23   Joesph Shaver Scales, PA-C  levothyroxine  (SYNTHROID ) 75 MCG tablet Take 75 mcg by mouth every morning. 06/21/23   [provider]  Multiple Vitamin (MULTI-VITAMIN) tablet Take 1 tablet by mouth daily. 05/08/16   [provider]  pantoprazole  (PROTONIX ) 20 MG tablet Take 1 tablet (20 mg total) by mouth daily. 04/13/24   Bauer, Collin S, PA-C  promethazine -dextromethorphan (PROMETHAZINE -DM) 6.25-15 MG/5ML syrup Take 5 mLs by mouth at bedtime as needed for cough.    [provider]  [Paused] valsartan (DIOVAN) 80 MG tablet Take 80 mg by mouth daily. Wait to take this until your doctor or other care provider tells you to start again. 09/29/22    [provider]      VITAL SIGNS:  Blood pressure 139/74, pulse 73, temperature 97.8 F (36.6 C), temperature source Oral, resp. rate 18, SpO2 96%.  PHYSICAL EXAMINATION:  Physical Exam  GENERAL:  69 y.o.-year-old patient lying in the bed with no acute distress.  EYES: Pupils equal, round, reactive to light and accommodation. No scleral icterus. Extraocular muscles intact.  HEENT: Head atraumatic, normocephalic. Oropharynx and nasopharynx clear.  NECK:  Supple, no jugular venous distention. No thyroid  enlargement, no tenderness.  LUNGS: Normal breath sounds bilaterally, no wheezing, rales,rhonchi or crepitation. No use of accessory muscles of respiration.  CARDIOVASCULAR: Regular rate and rhythm, S1, S2 normal. No murmurs, rubs, or gallops.  No palpable left foot DP or PT pulses.  Left foot is minimally cold. ABDOMEN: Soft, nondistended, nontender. Bowel sounds present. No organomegaly or mass.  EXTREMITIES: No pedal edema, cyanosis, or clubbing.  NEUROLOGIC: Cranial nerves II through XII are intact. Muscle strength 5/5 in all extremities. Sensation intact. Gait not checked.  PSYCHIATRIC: The patient is alert and oriented x 3.  Normal affect and good eye contact. SKIN: No obvious rash, lesion, or ulcer.   LABORATORY PANEL:   CBC Recent Labs  Lab 11/09/24 0356  WBC 9.3  HGB 11.7*  HCT 34.1*  PLT 262   ------------------------------------------------------------------------------------------------------------------  Chemistries  Recent Labs  Lab 11/08/24 2138 11/08/24 2225 11/09/24 0356  NA 140   < > 136  K 3.8   < > 4.3  CL 102   < > 105  CO2 20*  --  21*  GLUCOSE 212*   < > 126*  BUN 23   < > 21  CREATININE 1.72*   < > 1.23*  CALCIUM  9.8  --  8.9  AST 15  --   --   ALT 11  --   --   ALKPHOS 96  --   --   BILITOT 0.6  --   --    < > = values in this interval not displayed.    ------------------------------------------------------------------------------------------------------------------  Cardiac Enzymes No results for input(s): TROPONINI in the last 168 hours. ------------------------------------------------------------------------------------------------------------------  RADIOLOGY:  US  Abdomen Limited RUQ (LIVER/GB) Result Date: 11/09/2024 EXAM: Right Upper Quadrant Abdominal Ultrasound 11/09/2024 04:27:48 AM TECHNIQUE: Real-time ultrasonography of the right upper quadrant of the abdomen was performed. COMPARISON: CT Abdomen Pelvis 04/12/2024. CLINICAL HISTORY: Gallbladder mass. FINDINGS: LIVER: The hepatic parenchyma is heterogeneously echogenic. No lesions are evident. No intrahepatic biliary ductal dilatation. Hepatopetal  flow in the portal vein. BILIARY SYSTEM: Gallbladder wall thickness measures 0.2 cm. No pericholecystic fluid. There is a mobile stone within the gallbladder, measuring approximately 2 cm in diameter. There is no evidence of cholecystitis. The common bile duct measures 0.4 cm. RIGHT KIDNEY: No hydronephrosis. No echogenic calculi. No mass. PANCREAS: Visualized portions of the pancreas are unremarkable. OTHER: No right upper quadrant ascites. IMPRESSION: 1. Mobile 2 cm gallstone. No sonographic evidence of acute cholecystitis. 2. Heterogeneously echogenic hepatic parenchyma without focal lesion. Electronically signed by: Evalene Coho MD 11/09/2024 04:43 AM EST RP Workstation: [id number]   CT Angio Aortobifemoral W and/or Wo Contrast Result Date: 11/09/2024 EXAM: CTA ABDOMEN AND PELVIS WITH AND WITHOUT CONTRAST AND RUNOFF CTA OF THE LOWER EXTREMITIES WITH CONTRAST 11/09/2024 12:12:45 AM TECHNIQUE: CTA images of the abdomen, pelvis and lower extremities with and without intravenous contrast. Three-dimensional MIP/volume rendered formations were performed. Automated exposure control, iterative reconstruction, and/or weight based adjustment of  the mA/kV was utilized to reduce the radiation dose to as low as reasonably achievable. 100 mL (iohexol  (OMNIPAQUE ) 350 MG/ML injection 100 mL IOHEXOL  350 MG/ML SOLN) was administered. COMPARISON: Prior examination of 02/07/2024. CLINICAL HISTORY: Abdominal pain, back pain going down the left leg with no pulse, cold, and darkened. FINDINGS: VASCULATURE: AORTA: Moderate mixed atherosclerotic plaque within the abdominal aorta. No aneurysm or dissection. No hemodynamically significant stenosis. CELIAC TRUNK: No acute finding. No occlusion or significant stenosis. SUPERIOR MESENTERIC ARTERY: No acute finding. No occlusion or significant stenosis. INFERIOR MESENTERIC ARTERY: The inferior mesenteric artery is diminutive. RENAL ARTERIES: Single right renal artery is widely patent and demonstrates normal vascular morphology. Single left renal artery demonstrates a 50% stenosis of its origin secondary to calcified atherosclerotic plaque. Normal vascular morphology. No aneurysm or dissection. RIGHT ILIAC ARTERIES: Extensive mixed atherosclerotic plaque noted within the right lower extremity arterial inflow. The right external iliac artery is diffusely diminutive with a luminal diameter of 3-4 mm. The right internal iliac artery is moderately diseased but is patent at its origin. RIGHT FEMORAL ARTERIES: Right lower extremity arterial outflow demonstrates no hemodynamically significant stenosis in the common femoral artery and wide patency of the profunda femoral artery. The superficial femoral artery is diminutive and demonstrates scattered calcified atherosclerotic plaque. The vessel appears focally occluded in the adductor canal. RIGHT POPLITEAL ARTERY: The popliteal artery is diminutive but patent without hemodynamically significant stenosis. RIGHT CALF ARTERIES: There is 3-vessel runoff to the right ankle with patency of the dorsalis pedis and plantar arch vessels. LEFT ILIAC ARTERIES: The left common iliac artery has  undergone previous stenting and is occluded. There is collateral filling of the right external iliac artery by the inferior epigastric artery and retrograde filling of the external iliac artery and subsequent the left internal iliac artery. The left external iliac artery is diminutive demonstrating luminal diameter of 3 mm diffusely. LEFT FEMORAL ARTERIES: On the left, the profunda femoral artery is widely patent. The superficial femoral artery is diminutive and demonstrates scattered atherosclerotic calcification. The vessel is focally occluded within the adductor canal. LEFT POPLITEAL ARTERY: The left popliteal artery is widely patent. LEFT CALF ARTERIES: The trifurcation vessels are patent to the level of the ankle, however, the dorsalis pedis and plantar arch are not filled due to delayed filling of the left foot in relation to the right and likely the phase of contrast enhancement. Patency is not confirmed. ABDOMEN AND PELVIS: LOWER CHEST: Moderate emphysema. Bibasilar atelectasis. Small hiatal hernia. LIVER: The liver is unremarkable. GALLBLADDER AND BILE DUCTS:  There is an enhancing mass involving the gallbladder fundus measuring 11 mm (102/6) which may represent a gallbladder polyp or a gallbladder carcinoma. The latter is considered less likely given that this is stable since prior examination of 02/07/2024 but is not optimally characterized on this examination. Dedicated non-emergent right upper quadrant sonography would be helpful for further evaluation. No biliary ductal dilatation. SPLEEN: The spleen is unremarkable. PANCREAS: The pancreas is unremarkable. ADRENAL GLANDS: Nodular thickening of the left adrenal gland again identified without a discrete nodule seen. KIDNEYS, URETERS AND BLADDER: No stones in the kidneys or ureters. No hydronephrosis. No evidence of perinephric or periureteral stranding. Urinary bladder is unremarkable. GI AND Bowel: Severe descending and sigmoid colonic diverticulosis  without superimposed acute inflammatory change. Appendix normal. The stomach, small bowel, and large bowel are otherwise unremarkable. There is no bowel obstruction. No abnormal bowel wall thickening or distension. REPRODUCTIVE: Reproductive organs are unremarkable. PERITONEUM AND RETROPERITONEUM: No ascites or free air. LYMPH NODES: No evidence of lymphadenopathy. BONES AND SOFT TISSUES: 20 fat-containing left inguinal hernia and umbilical hernia. No acute abnormality of the bones. IMPRESSION: 1. Extensive mixed atherosclerotic plaque in the right lower extremity arterial inflow with a diminutive right external iliac artery (3-4 mm) and moderate disease of the right internal iliac artery. 2. 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 3. Right lower extremity arterial outflow: Focal occlusion of the superficial femoral artery in the adductor canal. 4. Right lower extremity runoff: 3-vessel runoff to the right ankle with patency of the dorsalis pedis and plantar arch vessels. 5. Left lower extremity arterial outflow: Focal occlusion of the superficial femoral artery in the adductor canal. 6. Left lower extremity runoff: Delayed filling of the left foot with non-confirmation of dorsalis pedis and plantar arch vessel patency. 7. Stable 11 mm enhancing mass in the gallbladder fundus, possibly representing a gallbladder polyp or carcinoma. Further evaluation with dedicated non-emergent right upper quadrant sonography is recommended. 8. Left renal artery origin 50% stenosis secondary to calcified atherosclerotic plaque. 9. Severe descending and sigmoid colonic diverticulosis without superimposed acute inflammatory change. 10. Pulmonary emphysema is an independent risk factor for lung cancer. Recommend consideration for evaluation for a low-dose CT lung cancer screening program.  Electronically signed by: Dorethia Molt MD 11/09/2024 12:44 AM EST RP Workstation: [id number]      IMPRESSION AND PLAN:  Assessment and Plan: * Occlusion of left iliac artery (HCC) - This is likely occlusion of the left common iliac artery with subsequent associated with acute critical left lower extremity ischemia. - The patient will be admitted to a telemetry bed. - Will continue IV heparin . - Pain management will be provided. - She will be kept NPO. - Vascular surgery consult will be obtained. - Dr. Vonzell was notified and is aware about the patient.  GERD without esophagitis - Will continue PPI therapy.  Hypothyroidism - We will continue Synthroid .  Essential hypertension - Will continue antihypertensive therapy.  Dyslipidemia - Will continue statin therapy.   DVT prophylaxis: IV heparin  drip Advanced Care Planning:  Code Status: full code. Family Communication:  The plan of care was discussed in details with the patient (and family). I answered all questions. The patient agreed to proceed with the above mentioned plan. Further management will depend upon hospital course. Disposition Plan: Back to previous home environment Consults called: Vascular surgery All the records are reviewed and case discussed with  ED provider.  Status is: Inpatient  At the time of the admission, it appears that the appropriate admission status for this patient is inpatient.  This is judged to be reasonable and necessary in order to provide the required intensity of service to ensure the patient's safety given the presenting symptoms, physical exam findings and initial radiographic and laboratory data in the context of comorbid conditions.  The patient requires inpatient status due to high intensity of service, high risk of further deterioration and high frequency of surveillance required.  I certify that at the time of admission, it is my clinical judgment that the patient will require  inpatient hospital care extending more than 2 midnights.                            Dispo: The patient is from: Home              Anticipated d/c is to: Home              Patient currently is not medically stable to d/c.              Difficult to place patient: No  [place] DELENA Peaches M.D on 11/09/2024 at 4:46 AM  [facility]   From 7 PM-7 AM, contact night-coverage [email or web address]  CC: Primary care physician; [facility], [address]       [1]  Allergies Allergen Reactions   Epinephrine Anaphylaxis    Respiratory problems, e.g., wheezing;  Palpitations   Palpitations  Respiratory problems, e.g., wheezing;, Palpitations   Novocain [Procaine] Palpitations and Other (See Comments)    Heart racing   Theraflu Severe Cold Daytime [Dm-Phenylephrine -Acetaminophen ] Palpitations   "

## 2024-11-09 NOTE — Assessment & Plan Note (Signed)
-   Will continue antihypertensive therapy.

## 2024-11-09 NOTE — ED Notes (Signed)
 Pt ambulated to restroom.

## 2024-11-09 NOTE — Plan of Care (Signed)

## 2024-11-09 NOTE — Progress Notes (Addendum)
 "          [facility]                                                                              Zoe Snyder, is a 69 y.o. female, DOB - [birth year], FMW:[id number] Admit date - 11/08/2024    Outpatient Primary MD for the patient is [facility] [employer]  LOS - 0  days  Chief Complaint  Patient presents with   Leg Pain       Brief summary   Patient is a 69 year old female with COPD, Hashimoto's thyroiditis, hypertension, PAD, status post a left common iliac artery angioplasty and stent, HLP, prediabetes, throat CA s/p resection presented to ED with acute onset of left leg pain over the last few hours prior to admission.  She reported it as  it feels like her left leg is dead, was cold and painful.  In ED, no pulse on Doppler.  Due to concern for critical ischemia, vascular surgery was consulted, started on IV heparin  and pain medications.  CTA lower extremities  IMPRESSION: 1. Extensive mixed atherosclerotic plaque in the right lower extremity arterial inflow with a diminutive right external iliac artery (3-4 mm) and moderate disease of the right internal iliac artery. 2. 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 3. Right lower extremity arterial outflow: Focal occlusion of the superficial femoral artery in the adductor canal. 4. Right lower extremity runoff: 3-vessel runoff to the right ankle with patency of the dorsalis pedis and plantar arch vessels. 5. Left lower extremity arterial outflow: Focal occlusion of the superficial femoral artery in the adductor canal. 6. Left lower extremity runoff: Delayed filling of the left foot with non-confirmation of dorsalis pedis and plantar arch vessel patency. 7. Stable 11 mm enhancing mass in the gallbladder fundus, possibly representing a gallbladder polyp or carcinoma. Further  evaluation with dedicated non-emergent right upper quadrant sonography is recommended. 8. Left renal artery origin 50% stenosis secondary to calcified atherosclerotic plaque. 9. Severe descending and sigmoid colonic diverticulosis without superimposed acute inflammatory change. 10. Pulmonary emphysema is an independent risk factor for lung cancer. Recommend consideration for evaluation for a low-dose CT lung cancer screening program.  Assessment & Plan    Principal Problem: Acute left leg pain occlusion of left iliac artery (HCC), acute limb ischemia -CTA lower extremities with thrombosed left iliac stenting - Vascular surgery consulted - n.p.o., plan for angiogram today with possible initiation of catheter directed thrombolysis - Continue IV heparin  drip, pain control - Continue aspirin , statin (of note, patient has not been on Eliquis  or Plavix  prior to admission).  Per vascular surgery outpatient note, she had completed course of Eliquis  for DVT in 09/2024 and both Plavix  and Eliquis  were discontinued per vascular surgery note on 09/12/2024)   Gallbladder polyp or mass, incidental finding on CTA lower extremities -RUQ US  showed a mobile 2 cm gallstone, no acute cholecystitis.  Heterogeneously echogenic hepatic parenchyma without any focal lesion  Acute kidney injury superimposed on CKD stage IIIa - Baseline creatinine 1.0-1.2, presented with a creatinine of 1.72, trended up  to 1.9 - Creatinine improving today, 1.2 - Follow closely with contrast imaging/intervention     Dyslipidemia -Continue statin    Essential hypertension -BP stable, continue amlodipine     Hypothyroidism -Continue Synthroid     GERD without esophagitis - Continue PPI    Obesity class I Estimated body mass index is 31.28 kg/m as calculated from the following:   Height as of 09/12/24: 5' 2 (1.575 m).   Weight as of 09/12/24: 77.6 kg.  Code Status: Full code DVT Prophylaxis:     Level of Care:  Level of care: Telemetry Family Communication: Updated patient Disposition Plan:      Remains inpatient appropriate: OR today  Procedures:  CTA lower extremities  Consultants:   Vascular surgery  Antimicrobials:   Anti-infectives (From admission, onward)    None          Medications  amLODipine   10 mg Oral Daily   aspirin  EC  81 mg Oral Q0600   atorvastatin   20 mg Oral QHS   cholecalciferol   1,000 Units Oral Daily   levothyroxine   75 mcg Oral q morning   multivitamin with minerals  1 tablet Oral Daily   pantoprazole   20 mg Oral Daily      Subjective:   Zoe Snyder was seen and examined today.  States left leg pain is feeling better, on IV heparin  drip and pain medications.  Left lower extremity/foot still cold, no pulse.  Patient denies dizziness, chest pain, shortness of breath, abdominal pain, N/V/D/C.  Objective:   Vitals:   11/08/24 2217 11/08/24 2234 11/09/24 0209 11/09/24 0552  BP:  135/83 139/74   Pulse:  88 73   Resp:  20 18   Temp:  97.8 F (36.6 C)  97.8 F (36.6 C)  TempSrc:  Oral  Oral  SpO2: 93% 98% 96%    No intake or output data in the 24 hours ending 11/09/24 0940   Wt Readings from Last 3 Encounters:  09/12/24 77.6 kg  04/22/24 85 kg  04/12/24 83.9 kg     Exam General: Alert and oriented x 3, NAD Cardiovascular: S1 S2 auscultated,  RRR Respiratory: Clear to auscultation bilaterally, no wheezing Gastrointestinal: Soft, nontender, nondistended, + bowel sounds Ext: no pedal edema bilaterally,cool, no Doppler flow in the left foot Neuro: No new deficits Psych: Normal affect     Data Reviewed:  I have personally reviewed following labs    CBC Lab Results  Component Value Date   WBC 9.3 11/09/2024   RBC 3.84 (L) 11/09/2024   HGB 11.7 (L) 11/09/2024   HCT 34.1 (L) 11/09/2024   MCV 88.8 11/09/2024   MCH 30.5 11/09/2024   PLT 262 11/09/2024   MCHC 34.3 11/09/2024   RDW 14.1 11/09/2024   LYMPHSABS 1.8 11/08/2024    MONOABS 0.7 11/08/2024   EOSABS 0.0 11/08/2024   BASOSABS 0.1 11/08/2024     Last metabolic panel Lab Results  Component Value Date   NA 136 11/09/2024   K 4.3 11/09/2024   CL 105 11/09/2024   CO2 21 (L) 11/09/2024   BUN 21 11/09/2024   CREATININE 1.23 (H) 11/09/2024   GLUCOSE 126 (H) 11/09/2024   GFRNONAA 48 (L) 11/09/2024   GFRAA 75 11/29/2017   CALCIUM  8.9 11/09/2024   PROT 7.4 11/08/2024   ALBUMIN 4.1 11/08/2024   BILITOT 0.6 11/08/2024   ALKPHOS 96 11/08/2024   AST 15 11/08/2024   ALT 11 11/08/2024   ANIONGAP 10 11/09/2024    CBG (last 3)  No results for input(s): GLUCAP in the last 72 hours.    Coagulation Profile: No results for input(s): INR, PROTIME in the last 168 hours.   Radiology Studies: I have personally reviewed the imaging studies  US  Abdomen Limited RUQ (LIVER/GB) Result Date: 11/09/2024 EXAM: Right Upper Quadrant Abdominal Ultrasound 11/09/2024 04:27:48 AM TECHNIQUE: Real-time ultrasonography of the right upper quadrant of the abdomen was performed. COMPARISON: CT Abdomen Pelvis 04/12/2024. CLINICAL HISTORY: Gallbladder mass. FINDINGS: LIVER: The hepatic parenchyma is heterogeneously echogenic. No lesions are evident. No intrahepatic biliary ductal dilatation. Hepatopetal flow in the portal vein. BILIARY SYSTEM: Gallbladder wall thickness measures 0.2 cm. No pericholecystic fluid. There is a mobile stone within the gallbladder, measuring approximately 2 cm in diameter. There is no evidence of cholecystitis. The common bile duct measures 0.4 cm. RIGHT KIDNEY: No hydronephrosis. No echogenic calculi. No mass. PANCREAS: Visualized portions of the pancreas are unremarkable. OTHER: No right upper quadrant ascites. IMPRESSION: 1. Mobile 2 cm gallstone. No sonographic evidence of acute cholecystitis. 2. Heterogeneously echogenic hepatic parenchyma without focal lesion. Electronically signed by: Evalene Coho MD 11/09/2024 04:43 AM EST RP Workstation:  [id number]   CT Angio Aortobifemoral W and/or Wo Contrast Result Date: 11/09/2024 EXAM: CTA ABDOMEN AND PELVIS WITH AND WITHOUT CONTRAST AND RUNOFF CTA OF THE LOWER EXTREMITIES WITH CONTRAST 11/09/2024 12:12:45 AM TECHNIQUE: CTA images of the abdomen, pelvis and lower extremities with and without intravenous contrast. Three-dimensional MIP/volume rendered formations were performed. Automated exposure control, iterative reconstruction, and/or weight based adjustment of the mA/kV was utilized to reduce the radiation dose to as low as reasonably achievable. 100 mL (iohexol  (OMNIPAQUE ) 350 MG/ML injection 100 mL IOHEXOL  350 MG/ML SOLN) was administered. COMPARISON: Prior examination of 02/07/2024. CLINICAL HISTORY: Abdominal pain, back pain going down the left leg with no pulse, cold, and darkened. FINDINGS: VASCULATURE: AORTA: Moderate mixed atherosclerotic plaque within the abdominal aorta. No aneurysm or dissection. No hemodynamically significant stenosis. CELIAC TRUNK: No acute finding. No occlusion or significant stenosis. SUPERIOR MESENTERIC ARTERY: No acute finding. No occlusion or significant stenosis. INFERIOR MESENTERIC ARTERY: The inferior mesenteric artery is diminutive. RENAL ARTERIES: Single right renal artery is widely patent and demonstrates normal vascular morphology. Single left renal artery demonstrates a 50% stenosis of its origin secondary to calcified atherosclerotic plaque. Normal vascular morphology. No aneurysm or dissection. RIGHT ILIAC ARTERIES: Extensive mixed atherosclerotic plaque noted within the right lower extremity arterial inflow. The right external iliac artery is diffusely diminutive with a luminal diameter of 3-4 mm. The right internal iliac artery is moderately diseased but is patent at its origin. RIGHT FEMORAL ARTERIES: Right lower extremity arterial outflow demonstrates no hemodynamically significant stenosis in the common femoral artery and wide patency of the profunda  femoral artery. The superficial femoral artery is diminutive and demonstrates scattered calcified atherosclerotic plaque. The vessel appears focally occluded in the adductor canal. RIGHT POPLITEAL ARTERY: The popliteal artery is diminutive but patent without hemodynamically significant stenosis. RIGHT CALF ARTERIES: There is 3-vessel runoff to the right ankle with patency of the dorsalis pedis and plantar arch vessels. LEFT ILIAC ARTERIES: The left common iliac artery has undergone previous stenting and is occluded. There is collateral filling of the right external iliac artery by the inferior epigastric artery and retrograde filling of the external iliac artery and subsequent the left internal iliac artery. The left external iliac artery is diminutive demonstrating luminal diameter of 3 mm diffusely. LEFT FEMORAL ARTERIES: On the left, the profunda femoral artery is widely patent. The superficial femoral artery  is diminutive and demonstrates scattered atherosclerotic calcification. The vessel is focally occluded within the adductor canal. LEFT POPLITEAL ARTERY: The left popliteal artery is widely patent. LEFT CALF ARTERIES: The trifurcation vessels are patent to the level of the ankle, however, the dorsalis pedis and plantar arch are not filled due to delayed filling of the left foot in relation to the right and likely the phase of contrast enhancement. Patency is not confirmed. ABDOMEN AND PELVIS: LOWER CHEST: Moderate emphysema. Bibasilar atelectasis. Small hiatal hernia. LIVER: The liver is unremarkable. GALLBLADDER AND BILE DUCTS: There is an enhancing mass involving the gallbladder fundus measuring 11 mm (102/6) which may represent a gallbladder polyp or a gallbladder carcinoma. The latter is considered less likely given that this is stable since prior examination of 02/07/2024 but is not optimally characterized on this examination. Dedicated non-emergent right upper quadrant sonography would be helpful for  further evaluation. No biliary ductal dilatation. SPLEEN: The spleen is unremarkable. PANCREAS: The pancreas is unremarkable. ADRENAL GLANDS: Nodular thickening of the left adrenal gland again identified without a discrete nodule seen. KIDNEYS, URETERS AND BLADDER: No stones in the kidneys or ureters. No hydronephrosis. No evidence of perinephric or periureteral stranding. Urinary bladder is unremarkable. GI AND Bowel: Severe descending and sigmoid colonic diverticulosis without superimposed acute inflammatory change. Appendix normal. The stomach, small bowel, and large bowel are otherwise unremarkable. There is no bowel obstruction. No abnormal bowel wall thickening or distension. REPRODUCTIVE: Reproductive organs are unremarkable. PERITONEUM AND RETROPERITONEUM: No ascites or free air. LYMPH NODES: No evidence of lymphadenopathy. BONES AND SOFT TISSUES: 20 fat-containing left inguinal hernia and umbilical hernia. No acute abnormality of the bones. IMPRESSION: 1. Extensive mixed atherosclerotic plaque in the right lower extremity arterial inflow with a diminutive right external iliac artery (3-4 mm) and moderate disease of the right internal iliac artery. 2. 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 3. Right lower extremity arterial outflow: Focal occlusion of the superficial femoral artery in the adductor canal. 4. Right lower extremity runoff: 3-vessel runoff to the right ankle with patency of the dorsalis pedis and plantar arch vessels. 5. Left lower extremity arterial outflow: Focal occlusion of the superficial femoral artery in the adductor canal. 6. Left lower extremity runoff: Delayed filling of the left foot with non-confirmation of dorsalis pedis and plantar arch vessel patency. 7. Stable 11 mm enhancing mass in the gallbladder fundus, possibly representing a  gallbladder polyp or carcinoma. Further evaluation with dedicated non-emergent right upper quadrant sonography is recommended. 8. Left renal artery origin 50% stenosis secondary to calcified atherosclerotic plaque. 9. Severe descending and sigmoid colonic diverticulosis without superimposed acute inflammatory change. 10. Pulmonary emphysema is an independent risk factor for lung cancer. Recommend consideration for evaluation for a low-dose CT lung cancer screening program. Electronically signed by: Dorethia Molt MD 11/09/2024 12:44 AM EST RP Workstation: [id number]       Nydia Distance M.D. [facility] 11/09/2024, 9:40 AM  Available via Epic secure chat 7am-7pm After 7 pm, please refer to night coverage provider listed on amion.    "

## 2024-11-09 NOTE — Progress Notes (Addendum)
 PHARMACY - ANTICOAGULATION CONSULT NOTE  Pharmacy Consult for heparin  Indication: EKOS for ischemic limb  Labs: Recent Labs    11/08/24 2225 11/09/24 0356 11/09/24 0741 11/09/24 1006 11/09/24 1828 11/09/24 1934 11/09/24 2319  HGB 12.9 11.7*  --   --   --  10.9* 10.6*  HCT 38.0 34.1*  --   --   --  32.5* 31.6*  PLT  --  262  --   --   --  244 206  APTT  --   --   --  176*  --   --   --   HEPARINUNFRC  --   --    < > >1.10* 0.98*  --  0.54  CREATININE 1.90* 1.23*  --   --   --   --  1.05*   < > = values in this interval not displayed.   Assessment: 69yo female remains slightly supratherapeutic on heparin ; no infusion issues or signs of bleeding per RN though noted that Hgb is trending down.  Goal of Therapy:  Heparin  level 0.2-0.5 units/ml   Plan:  Decrease heparin  infusion by 1 unit/kg/hr to 700 units/hr. Check level with next scheduled labs.   Marvetta Dauphin, PharmD, BCPS 11/09/2024 11:53 PM     Addendum: Heparin  level now 0.2, at goal.  Continue heparin  infusion and recheck with next scheduled labs.  VB 11/10/2024 6:30 AM

## 2024-11-09 NOTE — Consult Note (Signed)
 [facility] [place]  ASSESSMENT / PLAN: 69 y.o. female with thrombosed left iliac stenting causing Rutherford 1 acute limb ischemia. Plan angiogram today with possible initiation of catheter directed thrombolysis.  Keep NPO.  CHIEF COMPLAINT: Left foot pain  HISTORY OF PRESENT ILLNESS: Zoe Snyder is a 69 y.o. 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  She presented to the hospital last night with severe left leg pain.  She describes almost as a sciatic type picture.  As the pain progressed she describes paresthesias in her foot.  Her symptoms improved significantly with initiation of heparin .  She has no sensory changes in her foot and has normal motor function of her foot.  I reviewed her CT scan in detail with her and made a recommendation for trying to treat this minimally invasively.  Past Medical History:  Diagnosis Date   COPD (chronic obstructive pulmonary disease) (HCC)    Hashimoto's thyroiditis    History of alcohol abuse 11/30/2017   History of cocaine abuse (HCC) 11/30/2017   History of colon polyps    Hyperlipidemia    Hypertension    Prediabetes    Throat cancer (HCC)    Tobacco use disorder     Past Surgical History:  Procedure Laterality Date   ANGIOPLASTY  11/18/2023   Procedure: LEFT COMMON ILIAC ANGIOPLASTY USING VIABAHN 7 MM X AND 7 MM X 59 MM STENTS;  Surgeon: Magda Debby SAILOR, MD;  Location: [facility] OR;  Service: Vascular;;   AORTOGRAM  11/18/2023   Procedure: AORTOGRAM;  Surgeon: Magda Debby SAILOR, MD;  Location: [facility] OR;  Service: Vascular;;   COLONOSCOPY W/ POLYPECTOMY  2016   GROIN EXPOSURE Left 11/18/2023   Procedure: LEFT GROIN AND FEMORAL ARTERY EXPOSURE;  Surgeon: Magda Debby SAILOR, MD;  Location: [facility] OR;  Service: Vascular;  Laterality: Left;   INCISION AND DRAINAGE OF WOUND Left 04/22/2024   Procedure: IRRIGATION AND DEBRIDEMENT WOUND;  Surgeon: Romona Harari, MD;  Location: [facility] OR;  Service: Orthopedics;  Laterality: Left;  Left Hand Possible Forearm   THROAT SURGERY  2018   cancer   ULTRASOUND GUIDANCE FOR VASCULAR ACCESS Right 11/18/2023   Procedure: ULTRASOUND GUIDANCE FOR VASCULAR ACCESS;  Surgeon: Magda Debby SAILOR, MD;  Location: [facility] OR;  Service: Vascular;  Laterality: Right;    Family History  Problem Relation Age of Onset   Hypertension Mother    Diabetes Maternal Grandmother    Colon cancer Maternal Aunt     Social History   Socioeconomic History   Marital status: Single    Spouse name: Not on file   Number of children: Not on file   Years of education: Not on file   Highest education level: Not on file  Occupational History   Not on file  Tobacco Use   Smoking status: Every Day    Current packs/day: 0.50    Average packs/day: 0.5 packs/day for 45.0 years (22.5 ttl pk-yrs)    Types: Cigarettes   Smokeless tobacco: Never   Tobacco comments:    currently smoking 0.5 ppd, but she has cut down from over 1 ppd  Vaping Use   Vaping status: Never Used  Substance and Sexual Activity   Alcohol use: Yes   Drug use: Not Currently    Types: Marijuana   Sexual activity: Not Currently  Other Topics Concern   Not on file  Social History Narrative   Not on file  Social Drivers of Health   Tobacco Use: High Risk (09/12/2024)   Patient History    Smoking Tobacco Use: Every Day    Smokeless Tobacco Use: Never    Passive Exposure: Not on file  Financial Resource Strain: Not on file  Food Insecurity: No Food Insecurity (04/21/2024)   Epic    Worried About Programme Researcher, Broadcasting/film/video in the Last Year: Never true    Ran Out of Food in the Last Year: Never true  Transportation Needs: No Transportation Needs (04/21/2024)   Epic    Lack of Transportation (Medical): No    Lack of Transportation (Non-Medical): No  Physical Activity: Not on file  Stress: Not on file  Social Connections: Unknown (04/21/2024)   Social Connection and  Isolation Panel    Frequency of Communication with Friends and Family: Never    Frequency of Social Gatherings with Friends and Family: Never    Attends Religious Services: Never    Database Administrator or Organizations: Yes    Attends Engineer, Structural: More than 4 times per year    Marital Status: Patient declined  Intimate Partner Violence: Patient Declined (04/21/2024)   Epic    Fear of Current or Ex-Partner: Patient declined    Emotionally Abused: Patient declined    Physically Abused: Patient declined    Sexually Abused: Patient declined  Depression (PHQ2-9): Low Risk (06/29/2022)   Depression (PHQ2-9)    PHQ-2 Score: 0  Alcohol Screen: Not on file  Housing: Low Risk (04/21/2024)   Epic    Unable to Pay for Housing in the Last Year: No    Number of Times Moved in the Last Year: 0    Homeless in the Last Year: No  Utilities: Not At Risk (04/21/2024)   Epic    Threatened with loss of utilities: No  Health Literacy: Not on file    Allergies[1]  Current Facility-Administered Medications  Medication Dose Route Frequency Provider Last Rate Last Admin   0.9 %  sodium chloride  infusion   Intravenous Continuous Mansy, Madison LABOR, MD   Held at 11/09/24 0329   acetaminophen  (TYLENOL ) tablet 650 mg  650 mg Oral Q6H PRN Mansy, Jan A, MD       Or   acetaminophen  (TYLENOL ) suppository 650 mg  650 mg Rectal Q6H PRN Mansy, Jan A, MD       amLODipine  (NORVASC ) tablet 10 mg  10 mg Oral Daily Mansy, Jan A, MD       aspirin  EC tablet 81 mg  81 mg Oral Q0600 Mansy, Jan A, MD       atorvastatin  (LIPITOR) tablet 20 mg  20 mg Oral QHS Mansy, Jan A, MD       cholecalciferol  (VITAMIN D3) 25 MCG (1000 UNIT) tablet 1,000 Units  1,000 Units Oral Daily Mansy, Jan A, MD       heparin  ADULT infusion 100 units/mL (25000 units/250mL)  1,300 Units/hr Intravenous Continuous Dasie Faden, MD 13 mL/hr at 11/08/24 2325 1,300 Units/hr at 11/08/24 2325   ipratropium-albuterol  (DUONEB) 0.5-2.5 (3) MG/3ML  nebulizer solution 3 mL  3 mL Nebulization Q4H PRN Mansy, Jan A, MD       levothyroxine  (SYNTHROID ) tablet 75 mcg  75 mcg Oral q morning Mansy, Jan A, MD   75 mcg at 11/09/24 9480   magnesium  hydroxide (MILK OF MAGNESIA) suspension 30 mL  30 mL Oral Daily PRN Mansy, Jan A, MD       morphine  (PF) 2 MG/ML  injection 2 mg  2 mg Intravenous Q4H PRN Mansy, Jan A, MD       multivitamin with minerals tablet 1 tablet  1 tablet Oral Daily Mansy, Jan A, MD       ondansetron  (ZOFRAN ) tablet 4 mg  4 mg Oral Q6H PRN Mansy, Jan A, MD       Or   ondansetron  (ZOFRAN ) injection 4 mg  4 mg Intravenous Q6H PRN Mansy, Jan A, MD       pantoprazole  (PROTONIX ) EC tablet 20 mg  20 mg Oral Daily Mansy, Jan A, MD       traZODone  (DESYREL ) tablet 25 mg  25 mg Oral QHS PRN Mansy, Madison LABOR, MD       Current Outpatient Medications  Medication Sig Dispense Refill   acetaminophen  (TYLENOL ) 500 MG tablet Take 2 tablets (1,000 mg total) by mouth every 6 (six) hours. 30 tablet 0   amLODipine  (NORVASC ) 10 MG tablet Take 1 tablet (10 mg total) by mouth daily. Due for follow up visit 30 tablet 1   aspirin  EC 81 MG tablet Take 1 tablet (81 mg total) by mouth daily at 6 (six) AM. Swallow whole. 30 tablet 12   atorvastatin  (LIPITOR) 40 MG tablet Take 40 mg by mouth at bedtime.     Cholecalciferol  (VITAMIN D -1000 MAX ST) 25 MCG (1000 UT) tablet Take 1 tablet (1,000 Units total) by mouth daily. 12 tablet 0   ipratropium-albuterol  (DUONEB) 0.5-2.5 (3) MG/3ML SOLN Take 3 mLs by nebulization every 4 (four) hours as needed. 360 mL 1   levothyroxine  (SYNTHROID ) 75 MCG tablet Take 75 mcg by mouth every morning.     Multiple Vitamin (MULTI-VITAMIN) tablet Take 1 tablet by mouth daily.     valsartan (DIOVAN) 40 MG tablet Take 40 mg by mouth daily.     apixaban  (ELIQUIS ) 5 MG TABS tablet Take 1 tablet (5 mg total) by mouth 2 (two) times daily. (Patient not taking: Reported on 11/09/2024) 60 tablet 11   clopidogrel  (PLAVIX ) 75 MG tablet Take 1 tablet  (75 mg total) by mouth daily. (Patient not taking: Reported on 11/09/2024) 30 tablet 3   pantoprazole  (PROTONIX ) 20 MG tablet Take 1 tablet (20 mg total) by mouth daily. (Patient not taking: Reported on 11/09/2024) 30 tablet 0    PHYSICAL EXAM Vitals:   11/08/24 2217 11/08/24 2234 11/09/24 0209 11/09/24 0552  BP:  135/83 139/74   Pulse:  88 73   Resp:  20 18   Temp:  97.8 F (36.6 C)  97.8 F (36.6 C)  TempSrc:  Oral  Oral  SpO2: 93% 98% 96%    No acute distress Regular rate and rhythm Unlabored breathing No Doppler flow in the left foot. Normal motor and sensory function of the left foot  PERTINENT LABORATORY AND RADIOLOGIC DATA  Most recent CBC    Latest Ref Rng & Units 11/09/2024    3:56 AM 11/08/2024   10:25 PM 11/08/2024    9:38 PM  CBC  WBC 4.0 - 10.5 K/uL 9.3   11.9   Hemoglobin 12.0 - 15.0 g/dL 88.2  87.0  87.0   Hematocrit 36.0 - 46.0 % 34.1  38.0  38.0   Platelets 150 - 400 K/uL 262   349      Most recent CMP    Latest Ref Rng & Units 11/09/2024    3:56 AM 11/08/2024   10:25 PM 11/08/2024    9:38 PM  CMP  Glucose 70 - 99  mg/dL 873  786  787   BUN 8 - 23 mg/dL 21  26  23    Creatinine 0.44 - 1.00 mg/dL 8.76  8.09  8.27   Sodium 135 - 145 mmol/L 136  141  140   Potassium 3.5 - 5.1 mmol/L 4.3  3.8  3.8   Chloride 98 - 111 mmol/L 105  105  102   CO2 22 - 32 mmol/L 21   20   Calcium  8.9 - 10.3 mg/dL 8.9   9.8   Total Protein 6.5 - 8.1 g/dL   7.4   Total Bilirubin 0.0 - 1.2 mg/dL   0.6   Alkaline Phos 38 - 126 U/L   96   AST 15 - 41 U/L   15   ALT 0 - 44 U/L   11     Renal function CrCl cannot be calculated (Unknown ideal weight.).  Hgb A1c MFr Bld (%)  Date Value  04/22/2024 6.2 (H)    LDL Cholesterol (Calc)  Date Value Ref Range Status  11/29/2017 101 (H) mg/dL (calc) Final    Comment:    Reference range: <100 . Desirable range <100 mg/dL for primary prevention;   <70 mg/dL for patients with CHD or diabetic patients  with > or = 2 CHD risk  factors. SABRA LDL-C is now calculated using the Martin-Hopkins  calculation, which is a validated novel method providing  better accuracy than the Friedewald equation in the  estimation of LDL-C.  Gladis APPLETHWAITE et al. SANDREA. 7986;689(80): 2061-2068  (http://education.[facility].com/faq/FAQ164)     EXAM: CTA ABDOMEN AND PELVIS WITH AND WITHOUT CONTRAST AND RUNOFF CTA OF THE LOWER EXTREMITIES WITH CONTRAST 11/09/2024 12:12:45 AM   TECHNIQUE: CTA images of the abdomen, pelvis and lower extremities with and without intravenous contrast. Three-dimensional MIP/volume rendered formations were performed. Automated exposure control, iterative reconstruction, and/or weight based adjustment of the mA/kV was utilized to reduce the radiation dose to as low as reasonably achievable. 100 mL (iohexol  (OMNIPAQUE ) 350 MG/ML injection 100 mL IOHEXOL  350 MG/ML SOLN) was administered.   COMPARISON: Prior examination of 02/07/2024.   CLINICAL HISTORY: Abdominal pain, back pain going down the left leg with no pulse, cold, and darkened.   FINDINGS:   VASCULATURE: AORTA: Moderate mixed atherosclerotic plaque within the abdominal aorta. No aneurysm or dissection. No hemodynamically significant stenosis.   CELIAC TRUNK: No acute finding. No occlusion or significant stenosis.   SUPERIOR MESENTERIC ARTERY: No acute finding. No occlusion or significant stenosis.   INFERIOR MESENTERIC ARTERY: The inferior mesenteric artery is diminutive.   RENAL ARTERIES: Single right renal artery is widely patent and demonstrates normal vascular morphology. Single left renal artery demonstrates a 50% stenosis of its origin secondary to calcified atherosclerotic plaque. Normal vascular morphology. No aneurysm or dissection.   RIGHT ILIAC ARTERIES: Extensive mixed atherosclerotic plaque noted within the right lower extremity arterial inflow. The right external iliac artery is diffusely diminutive with a luminal  diameter of 3-4 mm. The right internal iliac artery is moderately diseased but is patent at its origin.   RIGHT FEMORAL ARTERIES: Right lower extremity arterial outflow demonstrates no hemodynamically significant stenosis in the common femoral artery and wide patency of the profunda femoral artery. The superficial femoral artery is diminutive and demonstrates scattered calcified atherosclerotic plaque. The vessel appears focally occluded in the adductor canal.   RIGHT POPLITEAL ARTERY: The popliteal artery is diminutive but patent without hemodynamically significant stenosis.   RIGHT CALF ARTERIES: There is 3-vessel runoff to the  right ankle with patency of the dorsalis pedis and plantar arch vessels.   LEFT ILIAC ARTERIES: The left common iliac artery has undergone previous stenting and is occluded. There is collateral filling of the right external iliac artery by the inferior epigastric artery and retrograde filling of the external iliac artery and subsequent the left internal iliac artery. The left external iliac artery is diminutive demonstrating luminal diameter of 3 mm diffusely.   LEFT FEMORAL ARTERIES: On the left, the profunda femoral artery is widely patent. The superficial femoral artery is diminutive and demonstrates scattered atherosclerotic calcification. The vessel is focally occluded within the adductor canal.   LEFT POPLITEAL ARTERY: The left popliteal artery is widely patent.   LEFT CALF ARTERIES: The trifurcation vessels are patent to the level of the ankle, however, the dorsalis pedis and plantar arch are not filled due to delayed filling of the left foot in relation to the right and likely the phase of contrast enhancement. Patency is not confirmed.   ABDOMEN AND PELVIS: LOWER CHEST: Moderate emphysema. Bibasilar atelectasis. Small hiatal hernia.   LIVER: The liver is unremarkable.   GALLBLADDER AND BILE DUCTS: There is an enhancing mass involving  the gallbladder fundus measuring 11 mm (102/6) which may represent a gallbladder polyp or a gallbladder carcinoma. The latter is considered less likely given that this is stable since prior examination of 02/07/2024 but is not optimally characterized on this examination. Dedicated non-emergent right upper quadrant sonography would be helpful for further evaluation. No biliary ductal dilatation.   SPLEEN: The spleen is unremarkable.   PANCREAS: The pancreas is unremarkable.   ADRENAL GLANDS: Nodular thickening of the left adrenal gland again identified without a discrete nodule seen.   KIDNEYS, URETERS AND BLADDER: No stones in the kidneys or ureters. No hydronephrosis. No evidence of perinephric or periureteral stranding. Urinary bladder is unremarkable.   GI AND Bowel: Severe descending and sigmoid colonic diverticulosis without superimposed acute inflammatory change. Appendix normal. The stomach, small bowel, and large bowel are otherwise unremarkable. There is no bowel obstruction. No abnormal bowel wall thickening or distension.   REPRODUCTIVE: Reproductive organs are unremarkable.   PERITONEUM AND RETROPERITONEUM: No ascites or free air.   LYMPH NODES: No evidence of lymphadenopathy.   BONES AND SOFT TISSUES: 20 fat-containing left inguinal hernia and umbilical hernia. No acute abnormality of the bones.   IMPRESSION: 1. Extensive mixed atherosclerotic plaque in the right lower extremity arterial inflow with a diminutive right external iliac artery (3-4 mm) and moderate disease of the right internal iliac artery. 2. 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 3. Right lower extremity arterial outflow: Focal occlusion of the superficial femoral artery in the adductor canal. 4. Right lower extremity runoff: 3-vessel runoff to the  right ankle with patency of the dorsalis pedis and plantar arch vessels. 5. Left lower extremity arterial outflow: Focal occlusion of the superficial femoral artery in the adductor canal. 6. Left lower extremity runoff: Delayed filling of the left foot with non-confirmation of dorsalis pedis and plantar arch vessel patency. 7. Stable 11 mm enhancing mass in the gallbladder fundus, possibly representing a gallbladder polyp or carcinoma. Further evaluation with dedicated non-emergent right upper quadrant sonography is recommended. 8. Left renal artery origin 50% stenosis secondary to calcified atherosclerotic plaque. 9. Severe descending and sigmoid colonic diverticulosis without superimposed acute inflammatory change. 10. Pulmonary emphysema is an independent risk  factor for lung cancer. Recommend consideration for evaluation for a low-dose CT lung cancer screening program.   Electronically signed by: Dorethia Molt MD 11/09/2024 12:44 AM EST RP Workstation: [id number]  Debby SAILOR. Magda, MD FACS [facility] Phone Number: [phone] 11/09/2024 7:53 AM   Total time spent on preparing this encounter including chart review, data review, collecting history, examining the patient, and coordinating care: 60 minutes  Portions of this report may have been transcribed using voice recognition software.  Every effort has been made to ensure accuracy; however, inadvertent computerized transcription errors may still be present.       [1]  Allergies Allergen Reactions   Epinephrine Anaphylaxis    Respiratory problems, e.g., wheezing;      Novocain [Procaine] Palpitations and Other (See Comments)    Heart racing   Theraflu Severe Cold Daytime [Dm-Phenylephrine -Acetaminophen ] Palpitations

## 2024-11-09 NOTE — ED Notes (Signed)
 Critical PTT 176

## 2024-11-09 NOTE — Consult Note (Addendum)
 "  NAME:  Zoe Snyder, MRN:  [id number], DOB:  09-04-1956, LOS: 0 ADMISSION DATE:  11/08/2024, CONSULTATION DATE:  1/28 REFERRING MD:  Magda, CHIEF COMPLAINT:   ischemic limb   History of Present Illness:  69 year old female w/ hx as outlined below presented to the ER 1/28 w/ cc: new back and left leg pain. First noted in back and radiated down the left leg around 1700 when ambulating. Noted the leg felt frozen.  Had stopped plavix  and eliquis  about 3 months ago.  In ER initial lactic acid was 1.8, scr 1.72.  CT angiogram c/w thrombosed iliac stent w/ resultant limb ischemia. She was seen by vascular surgery and subsequently brought for Aortogram & LLE arteriogram that identified an occluded left common iliac artery stent that was occluded down into the SFA.  An EKOS catheter was placed thru the occluded ileac stent down into SFA and started on tPA.   She returned to the Cardiac ICU post procedure for monitoring.  PCCM assisting w/ ICU care.  Pertinent  Medical History  COPD, Hashimoto thyroiditis, HTN, PAD, s/p left common iliac artery angioplasty and stent, dyslipidemia, pre-diabetes, throat cancer s/p RSXN, tobacco    Significant Hospital Events: Including procedures, antibiotic start and stop dates in addition to other pertinent events   1/28 admitted w/ ischemic left leg from occluded   Interim History / Subjective:    Objective    Blood pressure 139/82, pulse 68, temperature 98.3 F (36.8 C), temperature source Oral, resp. rate 19, SpO2 98%.       No intake or output data in the 24 hours ending 11/09/24 1559 There were no vitals filed for this visit.  Examination: General: chronically ill appearing woman lying in bed in NAD, awake & alert HENT: [place]/AT, eyes anicteric, hoarse voice Lungs: breathing comfortably on RA, CTAB Cardiovascular: S1S2, RRR Abdomen: soft, NT Extremities: weak pulses in both feet. L foot cooler than the R. Catheter in R fem. Neuro: awake,  alert, moving all extremities.  Derm: warm, chronic venous stasis changes distal BLE  BUN 21 Cr 1.23 WNC 9.3 H/H 11.7/34.1 Platelets 262     Resolved problem list   Assessment and Plan   S/p LLE limb ischemia 2/2 occluded Left ileac stent w/ clot burden extending to the SFA. S/p EKOS catheter placement and tPA 1/29 Plan -EKOS protocol w/ tPA infusion per vascular, heparin . CBC, fibrinogen per protocol -Serial vascular checks -SBP goal 120-160** -acetaminophen , oxycodone  PRN for pain -will likely need to resume plavix  and eliquis  at discharge indefinitely -bedrest  H/o HTN -con't amlodipine  -holding PTA valsartan  H/o HLD -statin  Hyperglycemia -check A1c, start low dose SSI PRN  H/o hypothyroidism  -resume PTA synthroid   GERD -PPI-PTA med  Tobacco abuse -strongly recommend total cessation -she wants to try Chantix again, which worked for her in the past; recommend her PCP start this as an OP or she can start at discharge -recommend referral for lung cancer screening  H/o H&N cancer -needs OP follow up with ENT    Labs   CBC: Recent Labs  Lab 11/08/24 2138 11/08/24 2225 11/09/24 0356  WBC 11.9*  --  9.3  NEUTROABS 9.2*  --   --   HGB 12.9 12.9 11.7*  HCT 38.0 38.0 34.1*  MCV 89.4  --  88.8  PLT 349  --  262    Basic Metabolic Panel: Recent Labs  Lab 11/08/24 2138 11/08/24 2225 11/09/24 0356  NA 140 141 136  K 3.8 3.8 4.3  CL 102 105 105  CO2 20*  --  21*  GLUCOSE 212* 213* 126*  BUN 23 26* 21  CREATININE 1.72* 1.90* 1.23*  CALCIUM  9.8  --  8.9   GFR: CrCl cannot be calculated (Unknown ideal weight.). Recent Labs  Lab 11/08/24 2138 11/08/24 2225 11/09/24 0106 11/09/24 0356  WBC 11.9*  --   --  9.3  LATICACIDVEN  --  1.8 0.7  --     Liver Function Tests: Recent Labs  Lab 11/08/24 2138  AST 15  ALT 11  ALKPHOS 96  BILITOT 0.6  PROT 7.4  ALBUMIN 4.1   No results for input(s): LIPASE, AMYLASE in the last 168  hours. No results for input(s): AMMONIA in the last 168 hours.  ABG    Component Value Date/Time   PHART 7.355 11/18/2023 1638   PCO2ART 45.4 11/18/2023 1638   PO2ART 117 (H) 11/18/2023 1638   HCO3 25.5 11/18/2023 1638   TCO2 21 (L) 11/08/2024 2225   ACIDBASEDEF 1.0 11/18/2023 1638   O2SAT 98 11/18/2023 1638     Coagulation Profile: No results for input(s): INR, PROTIME in the last 168 hours.  Cardiac Enzymes: No results for input(s): CKTOTAL, CKMB, CKMBINDEX, TROPONINI in the last 168 hours.  HbA1C: Hgb A1c MFr Bld  Date/Time Value Ref Range Status  04/22/2024 10:49 AM 6.2 (H) 4.8 - 5.6 % Final    Comment:    (NOTE)         Prediabetes: 5.7 - 6.4         Diabetes: >6.4         Glycemic control for adults with diabetes: <7.0   09/29/2022 12:14 PM 6.1 (H) 4.8 - 5.6 % Final    Comment:    (NOTE)         Prediabetes: 5.7 - 6.4         Diabetes: >6.4         Glycemic control for adults with diabetes: <7.0     CBG: No results for input(s): GLUCAP in the last 168 hours.  Review of Systems:   Review of Systems  Constitutional: Negative.   Respiratory: Negative.    Cardiovascular: Negative.   Skin:  Negative for rash.  Neurological:  Positive for tingling and weakness.     Past Medical History:  She,  has a past medical history of COPD (chronic obstructive pulmonary disease) (HCC), Hashimoto's thyroiditis, History of alcohol abuse (11/30/2017), History of cocaine abuse (HCC) (11/30/2017), History of colon polyps, Hyperlipidemia, Hypertension, Prediabetes, Throat cancer (HCC), and Tobacco use disorder.   Surgical History:   Past Surgical History:  Procedure Laterality Date   ANGIOPLASTY  11/18/2023   Procedure: LEFT COMMON ILIAC ANGIOPLASTY USING VIABAHN 7 MM X AND 7 MM X 59 MM STENTS;  Surgeon: Magda Debby SAILOR, MD;  Location: [facility] OR;  Service: Vascular;;   AORTOGRAM  11/18/2023   Procedure: AORTOGRAM;  Surgeon: Magda Debby SAILOR, MD;  Location:  [facility] OR;  Service: Vascular;;   COLONOSCOPY W/ POLYPECTOMY  2016   GROIN EXPOSURE Left 11/18/2023   Procedure: LEFT GROIN AND FEMORAL ARTERY EXPOSURE;  Surgeon: Magda Debby SAILOR, MD;  Location: [facility] OR;  Service: Vascular;  Laterality: Left;   INCISION AND DRAINAGE OF WOUND Left 04/22/2024   Procedure: IRRIGATION AND DEBRIDEMENT WOUND;  Surgeon: Romona Harari, MD;  Location: [facility] OR;  Service: Orthopedics;  Laterality: Left;  Left Hand Possible Forearm   THROAT SURGERY  2018  cancer   ULTRASOUND GUIDANCE FOR VASCULAR ACCESS Right 11/18/2023   Procedure: ULTRASOUND GUIDANCE FOR VASCULAR ACCESS;  Surgeon: Magda Debby SAILOR, MD;  Location: [facility] OR;  Service: Vascular;  Laterality: Right;     Social History:   reports that she has been smoking cigarettes. She has a 22.5 pack-year smoking history. She has never used smokeless tobacco. She reports current alcohol use. She reports that she does not currently use drugs after having used the following drugs: Marijuana.   Family History:  Her family history includes Colon cancer in her maternal aunt; Diabetes in her maternal grandmother; Hypertension in her mother.   Allergies Allergies[1]   Home Medications  Prior to Admission medications  Medication Sig Start Date End Date Taking? Authorizing Provider  acetaminophen  (TYLENOL ) 500 MG tablet Take 2 tablets (1,000 mg total) by mouth every 6 (six) hours. 04/24/24  Yes Elnora Ip, MD  amLODipine  (NORVASC ) 10 MG tablet Take 1 tablet (10 mg total) by mouth daily. Due for follow up visit 08/05/21  Yes Freddi Hamilton, MD  aspirin  EC 81 MG tablet Take 1 tablet (81 mg total) by mouth daily at 6 (six) AM. Swallow whole. 11/20/23  Yes Bethanie Cough, PA-C  atorvastatin  (LIPITOR) 40 MG tablet Take 40 mg by mouth at bedtime.   Yes [provider]  Cholecalciferol  (VITAMIN D -1000 MAX ST) 25 MCG (1000 UT) tablet Take 1 tablet (1,000 Units total) by mouth daily. 08/13/23  Yes Sofia, Leslie K, PA-C   ipratropium-albuterol  (DUONEB) 0.5-2.5 (3) MG/3ML SOLN Take 3 mLs by nebulization every 4 (four) hours as needed. 10/22/23  Yes Joesph Shaver Scales, PA-C  levothyroxine  (SYNTHROID ) 75 MCG tablet Take 75 mcg by mouth every morning. 06/21/23  Yes [provider]  Multiple Vitamin (MULTI-VITAMIN) tablet Take 1 tablet by mouth daily. 05/08/16  Yes [provider]  valsartan (DIOVAN) 40 MG tablet Take 40 mg by mouth daily. 09/25/24  Yes [provider]  apixaban  (ELIQUIS ) 5 MG TABS tablet Take 1 tablet (5 mg total) by mouth 2 (two) times daily. Patient not taking: Reported on 11/09/2024 03/28/24   Magda Debby SAILOR, MD  clopidogrel  (PLAVIX ) 75 MG tablet Take 1 tablet (75 mg total) by mouth daily. Patient not taking: Reported on 11/09/2024 05/02/24   Magda Debby SAILOR, MD  pantoprazole  (PROTONIX ) 20 MG tablet Take 1 tablet (20 mg total) by mouth daily. Patient not taking: Reported on 11/09/2024 04/13/24   Beola Terrall RAMAN, PA-C     Critical care time:      Leita SHAUNNA Gaskins, DO 11/09/24 5:23 PM [facility]  For contact information, see Amion. If no response to pager, please call PCCM 2H APP. After hours, 7PM- 7AM, please call on call APP for 2H.          [1]  Allergies Allergen Reactions   Epinephrine Anaphylaxis    Respiratory problems, e.g., wheezing;      Novocain [Procaine] Palpitations and Other (See Comments)    Heart racing   Theraflu Severe Cold Daytime [Dm-Phenylephrine -Acetaminophen ] Palpitations   "

## 2024-11-09 NOTE — Progress Notes (Signed)
 Patient had  some belongings at bedside (a pair of clothes, shoes, a cell phone and I pad) which is sent down with Cath lab NT staffs (Raquel, NT).

## 2024-11-09 NOTE — ED Notes (Signed)
 Patient arrived in the unit, she is alert and oriented, V/S obtained, CCMD notified, CHG bath given, denies pain at this time,  all needs met, POC continues.   11/09/24 1250  Vitals  Temp 98.3 F (36.8 C)  Temp Source Oral  BP 139/82  MAP (mmHg) 95  BP Location Left Arm  BP Method Automatic (dynamap)  Patient Position (if appropriate) Lying  Pulse Rate 68  Pulse Rate Source Monitor  ECG Heart Rate 69  Resp 19  Level of Consciousness  Level of Consciousness Alert  MEWS COLOR  MEWS Score Color Green  Oxygen Therapy  SpO2 98 %  O2 Device Room Air  Pain Assessment  Pain Scale 0-10  Pain Score 0  MEWS Score  MEWS Temp 0  MEWS Systolic 0  MEWS Pulse 0  MEWS RR 0  MEWS LOC 0  MEWS Score 0

## 2024-11-09 NOTE — Op Note (Signed)
" ° ° °  Patient name: Zoe Snyder MRN: [id number] DOB: [birth year] Sex: female  11/09/2024 Pre-operative Diagnosis: Occluded left iliac artery stent with limb ischemia Post-operative diagnosis:  Same Surgeon:  Lonni DOROTHA Gaskins, MD Procedure Performed: 1.  Ultrasound-guided access right common femoral artery 2.  Aortogram with catheter selection of aorta 3.  Left lower extremity arteriogram with catheter selection of the left external iliac artery 4.  Placement of thrombolytics catheter in the left iliac artery stent into the left SFA (EKOS) 5.  35 minutes of monitored moderate conscious sedation time  Indications: Patient is a 69 year old female presented with left leg pain with evidence of left iliac artery stent occlusion.  She presents for left lower extremity angiogram with thrombolysis after risk-benefits discussed.  Findings:   Ultrasound-guided access right common femoral artery.  Aortogram showed patent infrarenal aorta with occluded left common iliac artery stent with reconstitution of the distal left external iliac artery.  Distally there is patent common femoral profunda and SFA.  There is a high-grade stenosis in the distal SFA at Hunter's canal.  She has patent popliteal artery with sluggish runoff in 3 vessels below the knee.  Ultimately placed thrombolytics catheter into the left iliac artery stent that was occluded down into the SFA with an EKOS catheter from right femoral access.   Procedure:  The patient was identified in the holding area and taken to room 8.  The patient was then placed supine on the table and prepped and draped in the usual sterile fashion.  A time out was called.  Ultrasound was used to evaluate the right common femoral artery.  It was patent .  A digital ultrasound image was acquired.  A micropuncture needle was used to access the right common femoral artery under ultrasound guidance.  An 018 wire was advanced without resistance and a micropuncture  sheath was placed.  The 018 wire was removed and a benson wire was placed.  The micropuncture sheath was exchanged for a 5 french sheath.  An omniflush catheter was advanced over the wire to the level of L-1.  An abdominal angiogram was obtained.  Pertinent findings are noted above.  We did give 3000 units of IV heparin .  An ACT was checked.  Additional heparin  was given.  I then used a Glidewire advantage to cross the aortic bifurcation into the left external iliac artery and got through the occluded stent.  We did get left leg runoff.  Pertinent findings noted above.  Ultimately elected for intervention.  I did upsize to a long catapault 6 French sheath in the right common femoral arteryl.  I then got a Glidewire advantage down the left SFA and used a quick cross catheter exchange for a V18 wire.  We then advanced a EKOS catheter through the right femoral sheath over the aortic bifurcation through the occluded iliac stents down the left SFA and then placed the inner cannula and the EKOS system once the wire was removed.  We started tPA at 1 mg an hour with heparin  at 800 units an hour through the sheath.  She were taken to the ICU  Plan: Will initiate tPA at 1 mg an hour through the EKOS catheter with heparin  800 units an hour through the sheath.  Return tomorrow for lytics check.  Lonni DOROTHA Gaskins, MD [facility] Office: [phone]   "

## 2024-11-09 NOTE — Progress Notes (Signed)
 PHARMACY - ANTICOAGULATION CONSULT NOTE  Pharmacy Consult for IV heparin  Indication: possible DVT  Allergies[1]  Patient Measurements:    Vital Signs: Temp: 97.8 F (36.6 C) (01/29 0552) Temp Source: Oral (01/29 0552) BP: 139/74 (01/29 0209) Pulse Rate: 73 (01/29 0209)  Labs: Recent Labs    11/08/24 2138 11/08/24 2225 11/09/24 0356 11/09/24 0741  HGB 12.9 12.9 11.7*  --   HCT 38.0 38.0 34.1*  --   PLT 349  --  262  --   HEPARINUNFRC  --   --   --  >1.10*  CREATININE 1.72* 1.90* 1.23*  --     CrCl cannot be calculated (Unknown ideal weight.).   Medical History: Past Medical History:  Diagnosis Date   COPD (chronic obstructive pulmonary disease) (HCC)    Hashimoto's thyroiditis    History of alcohol abuse 11/30/2017   History of cocaine abuse (HCC) 11/30/2017   History of colon polyps    Hyperlipidemia    Hypertension    Prediabetes    Throat cancer (HCC)    Tobacco use disorder      Assessment: 69 yo F presenting with left lower leg pain. Pt was on Eliquis  but has not taken recently. MD concerned for DVT. Pharmacy consulted to dose heparin .   Heparin  level >1.10, supra therapeutic (repeated to confirm) aPTT 176, supra therapeutic No issues with bleeding or concern per RN. Drawn on opposite arm as the heparin  infusing.   Per vascular -  Plan angiogram today with possible initiation of catheter directed thrombolysis   Goal of Therapy:  Heparin  level 0.3-0.7 units/ml Monitor platelets by anticoagulation protocol: Yes   Plan:  Hold x 1 hour and decrease heparin  infusion at 1000 units/hr Given aPTT and HL correlating, will use HL monitoring  Daily heparin  level, CBC, and monitoring for bleeding F/u plans for anticoagulation   Thank you for allowing pharmacy to participate in this patient's care.  Leonor GORMAN Bash, PharmD Emergency Medicine Clinical Pharmacist 11/09/2024,11:22 AM     [1]  Allergies Allergen Reactions   Epinephrine Anaphylaxis     Respiratory problems, e.g., wheezing;      Novocain [Procaine] Palpitations and Other (See Comments)    Heart racing   Theraflu Severe Cold Daytime [Dm-Phenylephrine -Acetaminophen ] Palpitations

## 2024-11-09 NOTE — Assessment & Plan Note (Addendum)
-   This is likely occlusion of the left common iliac artery with subsequent associated with acute critical left lower extremity ischemia. - The patient will be admitted to a telemetry bed. - Will continue IV heparin . - Pain management will be provided. - She will be kept NPO. - Vascular surgery consult will be obtained. - Dr. Vonzell was notified and is aware about the patient.

## 2024-11-09 NOTE — Progress Notes (Signed)
 PHARMACY - ANTICOAGULATION CONSULT NOTE  Pharmacy Consult for IV heparin  Indication: limb ischemia  Allergies[1]  Patient Measurements: Height: 5' 3 (160 cm) Weight: 75.8 kg (167 lb 1.7 oz) IBW/kg (Calculated) : 52.4 HEPARIN  DW (KG): 68.6  Vital Signs: Temp: 97.8 F (36.6 C) (01/29 1630) Temp Source: Oral (01/29 1630) BP: 121/67 (01/29 1900) Pulse Rate: 73 (01/29 1900)  Labs: Recent Labs    11/08/24 2138 11/08/24 2225 11/09/24 0356 11/09/24 0741 11/09/24 1006 11/09/24 1828  HGB 12.9 12.9 11.7*  --   --   --   HCT 38.0 38.0 34.1*  --   --   --   PLT 349  --  262  --   --   --   APTT  --   --   --   --  176*  --   HEPARINUNFRC  --   --   --  >1.10* >1.10* 0.98*  CREATININE 1.72* 1.90* 1.23*  --   --   --     Estimated Creatinine Clearance: 42.7 mL/min (A) (by C-G formula based on SCr of 1.23 mg/dL (H)).   Medical History: Past Medical History:  Diagnosis Date   COPD (chronic obstructive pulmonary disease) (HCC)    Hashimoto's thyroiditis    History of alcohol abuse 11/30/2017   History of cocaine abuse (HCC) 11/30/2017   History of colon polyps    Hyperlipidemia    Hypertension    Prediabetes    Throat cancer (HCC)    Tobacco use disorder      Assessment: 69 yo F presenting with left lower leg pain. Pt was on Eliquis  but has not taken recently. MD concerned for DVT. Pharmacy consulted to dose heparin .   Pt now receiving cath directed alteplase . Plan relook tomorrow. Heparin  6000 unit bolus given in cath lab ~1500.   Heparin  level 0.98 (supratherapeutic) but drawn only ~3hr post heparin  bolus so likely heparin  bolus still affecting. No bleeding noted.  Goal of Therapy:  Heparin  level 0.2-0.5 units/ml Monitor platelets by anticoagulation protocol: Yes   Plan:  Continue heparin  800 units/hr Will f/u heparin  level ~8 hours post bolus  Thank you for allowing pharmacy to participate in this patient's care.  Vito Ralph, PharmD, BCPS Please see amion  for complete clinical pharmacist phone list 11/09/2024,7:36 PM      [1]  Allergies Allergen Reactions   Epinephrine Anaphylaxis    Respiratory problems, e.g., wheezing;      Novocain [Procaine] Palpitations and Other (See Comments)    Heart racing   Theraflu Severe Cold Daytime [Dm-Phenylephrine -Acetaminophen ] Palpitations

## 2024-11-09 NOTE — Assessment & Plan Note (Signed)
-   Will continue PPI therapy.

## 2024-11-09 NOTE — Assessment & Plan Note (Signed)
 Will continue statin therapy

## 2024-11-09 NOTE — Assessment & Plan Note (Signed)
 -  We will continue Synthroid.

## 2024-11-10 ENCOUNTER — Telehealth (HOSPITAL_COMMUNITY): Payer: Self-pay

## 2024-11-10 ENCOUNTER — Encounter (HOSPITAL_COMMUNITY): Admission: EM | Disposition: A | Payer: Self-pay | Source: Home / Self Care

## 2024-11-10 ENCOUNTER — Other Ambulatory Visit (HOSPITAL_COMMUNITY): Payer: Self-pay

## 2024-11-10 DIAGNOSIS — I745 Embolism and thrombosis of iliac artery: Secondary | ICD-10-CM

## 2024-11-10 DIAGNOSIS — T82868A Thrombosis of vascular prosthetic devices, implants and grafts, initial encounter: Principal | ICD-10-CM

## 2024-11-10 DIAGNOSIS — K219 Gastro-esophageal reflux disease without esophagitis: Secondary | ICD-10-CM | POA: Diagnosis not present

## 2024-11-10 DIAGNOSIS — I10 Essential (primary) hypertension: Secondary | ICD-10-CM | POA: Diagnosis not present

## 2024-11-10 DIAGNOSIS — Z720 Tobacco use: Secondary | ICD-10-CM | POA: Diagnosis not present

## 2024-11-10 DIAGNOSIS — E785 Hyperlipidemia, unspecified: Secondary | ICD-10-CM | POA: Diagnosis not present

## 2024-11-10 DIAGNOSIS — I70222 Atherosclerosis of native arteries of extremities with rest pain, left leg: Secondary | ICD-10-CM

## 2024-11-10 DIAGNOSIS — E039 Hypothyroidism, unspecified: Secondary | ICD-10-CM | POA: Diagnosis not present

## 2024-11-10 DIAGNOSIS — R739 Hyperglycemia, unspecified: Secondary | ICD-10-CM | POA: Diagnosis not present

## 2024-11-10 LAB — CBC
HCT: 33.2 % — ABNORMAL LOW (ref 36.0–46.0)
HCT: 33.3 % — ABNORMAL LOW (ref 36.0–46.0)
Hemoglobin: 10.9 g/dL — ABNORMAL LOW (ref 12.0–15.0)
Hemoglobin: 10.9 g/dL — ABNORMAL LOW (ref 12.0–15.0)
MCH: 30.2 pg (ref 26.0–34.0)
MCH: 30.1 pg (ref 26.0–34.0)
MCHC: 32.8 g/dL (ref 30.0–36.0)
MCHC: 32.7 g/dL (ref 30.0–36.0)
MCV: 92 fL (ref 80.0–100.0)
MCV: 92 fL (ref 80.0–100.0)
Platelets: 201 10*3/uL (ref 150–400)
Platelets: 191 10*3/uL (ref 150–400)
RBC: 3.61 MIL/uL — ABNORMAL LOW (ref 3.87–5.11)
RBC: 3.62 MIL/uL — ABNORMAL LOW (ref 3.87–5.11)
RDW: 14.4 % (ref 11.5–15.5)
RDW: 14.4 % (ref 11.5–15.5)
WBC: 7.7 10*3/uL (ref 4.0–10.5)
WBC: 7.8 10*3/uL (ref 4.0–10.5)
nRBC: 0 % (ref 0.0–0.2)
nRBC: 0 % (ref 0.0–0.2)

## 2024-11-10 LAB — FIBRINOGEN
Fibrinogen: 391 mg/dL (ref 210–475)
Fibrinogen: 435 mg/dL (ref 210–475)
Fibrinogen: 419 mg/dL (ref 210–475)

## 2024-11-10 LAB — HEMOGLOBIN A1C
Hgb A1c MFr Bld: 6.3 % — ABNORMAL HIGH (ref 4.8–5.6)
Mean Plasma Glucose: 134.11 mg/dL

## 2024-11-10 LAB — BASIC METABOLIC PANEL WITH GFR
Anion gap: 9 (ref 5–15)
BUN: 14 mg/dL (ref 8–23)
CO2: 21 mmol/L — ABNORMAL LOW (ref 22–32)
Calcium: 8.3 mg/dL — ABNORMAL LOW (ref 8.9–10.3)
Chloride: 109 mmol/L (ref 98–111)
Creatinine, Ser: 0.94 mg/dL (ref 0.44–1.00)
GFR, Estimated: >60 mL/min
Glucose, Bld: 158 mg/dL — ABNORMAL HIGH (ref 70–99)
Potassium: 3.6 mmol/L (ref 3.5–5.1)
Sodium: 139 mmol/L (ref 135–145)

## 2024-11-10 LAB — LIPID PANEL
Cholesterol: 139 mg/dL (ref 0–200)
HDL: 39 mg/dL — ABNORMAL LOW
LDL Cholesterol: 76 mg/dL (ref 0–99)
Total CHOL/HDL Ratio: 3.6 ratio
Triglycerides: 123 mg/dL
VLDL: 25 mg/dL (ref 0–40)

## 2024-11-10 LAB — GLUCOSE, CAPILLARY
Glucose-Capillary: 132 mg/dL — ABNORMAL HIGH (ref 70–99)
Glucose-Capillary: 135 mg/dL — ABNORMAL HIGH (ref 70–99)
Glucose-Capillary: 104 mg/dL — ABNORMAL HIGH (ref 70–99)
Glucose-Capillary: 96 mg/dL (ref 70–99)
Glucose-Capillary: 175 mg/dL — ABNORMAL HIGH (ref 70–99)
Glucose-Capillary: 111 mg/dL — ABNORMAL HIGH (ref 70–99)

## 2024-11-10 LAB — HEPARIN LEVEL (UNFRACTIONATED)
Heparin Unfractionated: 0.2 [IU]/mL — ABNORMAL LOW (ref 0.30–0.70)
Heparin Unfractionated: 0.16 [IU]/mL — ABNORMAL LOW (ref 0.30–0.70)

## 2024-11-10 MED ORDER — POTASSIUM CHLORIDE CRYS ER 20 MEQ PO TBCR
40.0000 meq | EXTENDED_RELEASE_TABLET | Freq: Once | ORAL | Status: AC
  Administered 2024-11-10: 40 meq via ORAL
  Filled 2024-11-10: qty 2

## 2024-11-10 MED ORDER — LIDOCAINE HCL (PF) 1 % IJ SOLN
INTRAMUSCULAR | Status: AC
  Filled 2024-11-10: qty 30

## 2024-11-10 MED ORDER — SODIUM CHLORIDE 0.9% FLUSH
3.0000 mL | Freq: Two times a day (BID) | INTRAVENOUS | Status: DC
  Administered 2024-11-11 – 2024-11-13 (×6): 3 mL via INTRAVENOUS

## 2024-11-10 MED ORDER — LABETALOL HCL 5 MG/ML IV SOLN: 10.0000 mg | INTRAVENOUS | Status: DC | PRN

## 2024-11-10 MED ORDER — CLOPIDOGREL BISULFATE 75 MG PO TABS
75.0000 mg | ORAL_TABLET | Freq: Every day | ORAL | Status: DC
  Administered 2024-11-11 – 2024-11-13 (×3): 75 mg via ORAL
  Filled 2024-11-10 (×3): qty 1

## 2024-11-10 MED ORDER — SODIUM CHLORIDE 0.9 % WEIGHT BASED INFUSION
1.0000 mL/kg/h | INTRAVENOUS | Status: AC
  Administered 2024-11-10: 1 mL/kg/h via INTRAVENOUS

## 2024-11-10 MED ORDER — HYDRALAZINE HCL 20 MG/ML IJ SOLN: 5.0000 mg | INTRAMUSCULAR | Status: DC | PRN

## 2024-11-10 MED ORDER — ASPIRIN 81 MG PO TBEC
81.0000 mg | DELAYED_RELEASE_TABLET | Freq: Every day | ORAL | Status: DC
  Administered 2024-11-11 – 2024-11-13 (×3): 81 mg via ORAL
  Filled 2024-11-10 (×3): qty 1

## 2024-11-10 MED ORDER — CLOPIDOGREL BISULFATE 300 MG PO TABS
300.0000 mg | ORAL_TABLET | Freq: Once | ORAL | Status: AC
  Administered 2024-11-10: 300 mg via ORAL
  Filled 2024-11-10: qty 1

## 2024-11-10 MED ORDER — IODIXANOL 320 MG/ML IV SOLN
INTRAVENOUS | Status: DC | PRN
  Administered 2024-11-10: 30 mL via INTRA_ARTERIAL

## 2024-11-10 MED ORDER — SODIUM CHLORIDE 0.9% FLUSH
3.0000 mL | INTRAVENOUS | Status: DC | PRN
  Administered 2024-11-12: 3 mL via INTRAVENOUS

## 2024-11-10 MED ORDER — HEPARIN (PORCINE) IN NACL 1000-0.9 UT/500ML-% IV SOLN
INTRAVENOUS | Status: DC | PRN
  Administered 2024-11-10: 1000 mL via SURGICAL_CAVITY

## 2024-11-10 MED ORDER — MIDAZOLAM HCL (PF) 2 MG/2ML IJ SOLN
INTRAMUSCULAR | Status: DC | PRN
  Administered 2024-11-10: 1 mg via INTRAVENOUS

## 2024-11-10 MED ORDER — SODIUM CHLORIDE 0.9 % IV SOLN: INTRAVENOUS | Status: DC

## 2024-11-10 MED ORDER — FENTANYL CITRATE (PF) 100 MCG/2ML IJ SOLN
INTRAMUSCULAR | Status: AC
  Filled 2024-11-10: qty 2

## 2024-11-10 MED ORDER — SODIUM CHLORIDE 0.9 % IV SOLN: 250.0000 mL | INTRAVENOUS | Status: AC | PRN

## 2024-11-10 MED ORDER — ATORVASTATIN CALCIUM 80 MG PO TABS
80.0000 mg | ORAL_TABLET | Freq: Every day | ORAL | Status: DC
  Administered 2024-11-10 – 2024-11-12 (×3): 80 mg via ORAL
  Filled 2024-11-10 (×3): qty 1

## 2024-11-10 MED ORDER — HEPARIN (PORCINE) 25000 UT/250ML-% IV SOLN
800.0000 [IU]/h | INTRAVENOUS | Status: DC
  Administered 2024-11-10: 800 [IU]/h via INTRAVENOUS
  Filled 2024-11-10: qty 250

## 2024-11-10 MED ORDER — FENTANYL CITRATE (PF) 100 MCG/2ML IJ SOLN
INTRAMUSCULAR | Status: DC | PRN
  Administered 2024-11-10: 50 ug via INTRAVENOUS

## 2024-11-10 MED ORDER — CARMEX CLASSIC LIP BALM EX OINT: TOPICAL_OINTMENT | CUTANEOUS | Status: DC | PRN

## 2024-11-10 MED ORDER — LIDOCAINE HCL (PF) 1 % IJ SOLN
INTRAMUSCULAR | Status: DC | PRN
  Administered 2024-11-10: 10 mL

## 2024-11-10 MED ORDER — MIDAZOLAM HCL 2 MG/2ML IJ SOLN
INTRAMUSCULAR | Status: AC
  Filled 2024-11-10: qty 2

## 2024-11-10 NOTE — Progress Notes (Addendum)
" °  Progress Note    11/10/2024 8:05 AM 1 Day Post-Op  Subjective: Left foot feels much better compared to preop   Vitals:   11/10/24 0600 11/10/24 0700  BP: 118/67 122/71  Pulse: 66 69  Resp: 15 15  Temp:  98.4 F (36.9 C)  SpO2: 97% 96%   Physical Exam: Lungs:  non labored Incisions: Right groin without hematoma Extremities: DP Doppler signal bilaterally Abdomen:  soft, NT, ND Neurologic: A&O  CBC    Component Value Date/Time   WBC 7.7 11/10/2024 0440   RBC 3.61 (L) 11/10/2024 0440   HGB 10.9 (L) 11/10/2024 0440   HCT 33.2 (L) 11/10/2024 0440   PLT 201 11/10/2024 0440   MCV 92.0 11/10/2024 0440   MCH 30.2 11/10/2024 0440   MCHC 32.8 11/10/2024 0440   RDW 14.4 11/10/2024 0440   LYMPHSABS 1.8 11/08/2024 2138   MONOABS 0.7 11/08/2024 2138   EOSABS 0.0 11/08/2024 2138   BASOSABS 0.1 11/08/2024 2138    BMET    Component Value Date/Time   NA 139 11/10/2024 0440   K 3.6 11/10/2024 0440   CL 109 11/10/2024 0440   CO2 21 (L) 11/10/2024 0440   GLUCOSE 158 (H) 11/10/2024 0440   BUN 14 11/10/2024 0440   CREATININE 0.94 11/10/2024 0440   CREATININE 0.95 11/29/2017 1609   CALCIUM  8.3 (L) 11/10/2024 0440   GFRNONAA >60 11/10/2024 0440   GFRNONAA 65 11/29/2017 1609   GFRAA 75 11/29/2017 1609    INR    Component Value Date/Time   INR 1.2 04/12/2024 2352     Intake/Output Summary (Last 24 hours) at 11/10/2024 0805 Last data filed at 11/10/2024 0651 Gross per 24 hour  Intake 2433.44 ml  Output 400 ml  Net 2033.44 ml     Assessment/Plan:  69 y.o. female is s/p aortogram with initiation of thrombolysis due to thrombosed left common iliac artery stent with acute limb ischemia 1 Day Post-Op   Subjectively Ms. Samek feels much better.  She has sensation back in her left foot and did not have any pain overnight.  She believes her foot feels much warmer.  On exam the right groin cath site is without hematoma or bleeding.  She has brisk DP signals of bilateral  lower extremities.  We will keep her n.p.o.  She will be brought back to the Cath Lab for lysis recheck with Dr. Magda.   Donnice Sender, PA-C [facility] [phone] 11/10/2024 8:05 AM  I have seen and evaluated the patient. I agree with the PA note as documented above.  Postprocedure day 1 status post initiation of thrombolysis left lower extremity for occluded left iliac artery stent.  Foot subjectively feels better.  She has a brisk multiphasic left DP signal.  Right groin insertion site for the sheath looks good.  Return to the Cath Lab today for thrombolysis check.  Hemoglobin stable 10.9.  Fibrinogen 435  Lonni DOROTHA Gaskins, MD [facility] [place] Office: [phone]   "

## 2024-11-10 NOTE — Progress Notes (Signed)
 PHARMACY - ANTICOAGULATION CONSULT NOTE  Pharmacy Consult for heparin  Indication: EKOS for ischemic limb  Labs: Recent Labs    11/09/24 0356 11/09/24 0741 11/09/24 1006 11/09/24 1828 11/09/24 2319 11/10/24 0440 11/10/24 1033  HGB 11.7*  --   --    < > 10.6* 10.9* 10.9*  HCT 34.1*  --   --    < > 31.6* 33.2* 33.3*  PLT 262  --   --    < > 206 201 191  APTT  --   --  176*  --   --   --   --   HEPARINUNFRC  --    < > >1.10*   < > 0.54 0.20* 0.16*  CREATININE 1.23*  --   --   --  1.05* 0.94  --    < > = values in this interval not displayed.   Assessment: 69yo female remains slightly supratherapeutic on heparin ; no infusion issues or signs of bleeding per RN though noted that Hgb is trending down.  Confirmatory heparin  level 0.16 below goal on 700 units/hr (previously therapeutic this morning).  Fibrinogen 419, Hgb 10.9 both stable.  Going back to OR for re-look this afternoon.  Goal of Therapy:  Heparin  level 0.2-0.5 units/ml   Plan:  Increase heparin  infusion slightly to 800 units/hr Alteplase  1 mg/h F/u after re-look vs 6h heparin  level  Maurilio Fila, PharmD Clinical Pharmacist 11/10/2024  1:25 PM

## 2024-11-10 NOTE — Plan of Care (Signed)

## 2024-11-10 NOTE — Progress Notes (Signed)
 PHARMACY - ANTICOAGULATION CONSULT NOTE  Pharmacy Consult for heparin  Indication: limb ischemia  Labs: Recent Labs    11/09/24 0356 11/09/24 0741 11/09/24 1006 11/09/24 1828 11/09/24 2319 11/10/24 0440 11/10/24 1033  HGB 11.7*  --   --    < > 10.6* 10.9* 10.9*  HCT 34.1*  --   --    < > 31.6* 33.2* 33.3*  PLT 262  --   --    < > 206 201 191  APTT  --   --  176*  --   --   --   --   HEPARINUNFRC  --    < > >1.10*   < > 0.54 0.20* 0.16*  CREATININE 1.23*  --   --   --  1.05* 0.94  --    < > = values in this interval not displayed.   Assessment: 69yo female s/p EKOS cath directed therapy for limb ischemia. Pt back to OR today where catheters removed. Per vascular, okay to restart heparin  8 hours post procedure. Case ended ~1500. Plan for ASA, plavix , and Eliquis  indefinitely  Goal of Therapy:  Heparin  level 0.3-0.5 units/ml   Plan:  At 2300, restart heparin  infusion at 800 units/hr Will f/u 8 hr heparin  level post restart  Vito Ralph, PharmD, BCPS Please see amion for complete clinical pharmacist phone list 11/10/2024  3:57 PM

## 2024-11-10 NOTE — Telephone Encounter (Signed)
 Pharmacy Patient Advocate Encounter  Insurance verification completed.    The patient is insured through [facility]. Patient has Medicare and is not eligible for a copay card, but may be able to apply for patient assistance or Medicare RX Payment Plan (Patient Must reach out to their plan, if eligible for payment plan), if available.    Ran test claim for Eliquis  5mg  tablet and the current 30 day co-pay is $12.65.  Ran test claim for Xarelto  20mg  tablet and the current 30 day co-pay is $12.65.  Ran test claim for Chantix  1mg  tablet and the current 30 day co-pay is $5.10.  This test claim was processed through [facility]- copay amounts may vary at other pharmacies due to pharmacy/plan contracts, or as the patient moves through the different stages of their insurance plan.

## 2024-11-10 NOTE — Op Note (Signed)
 DATE OF SERVICE: 11/10/2024  PATIENT:  Zoe Snyder  69 y.o. 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 Magda, MD  ASSISTANT: none  ANESTHESIA:   local and IV sedation  ESTIMATED BLOOD LOSS: min  LOCAL MEDICATIONS USED:  LIDOCAINE    COUNTS: confirmed correct.  PATIENT DISPOSITION:  PACU - hemodynamically stable.   Delay start of Pharmacological VTE agent (>24hrs) due to surgical blood loss or risk of bleeding: no  INDICATION FOR PROCEDURE: Jarrett J Louissaint is a 69 y.o. female with ongoing catheter directed thrombolysis. After careful discussion of risks, benefits, and alternatives the patient was offered catheter recheck. The patient understood and wished to proceed.  OPERATIVE FINDINGS:   Aortogram: Infrarenal aorta: patent Common iliac arteries: Left: patent  Right: patent Internal iliac arteries: Left: patent  Right: patent External iliac arteries: Left: patent  Right: patent  Left Lower Extremity Angiogram:  Common femoral artery: patent  Profunda femoris artery: patent  Superficial femoral artery: focal stenosis in mid artery ~50%  Popliteal artery: patent  Anterior tibial artery: patent  Tibioperoneal trunk: patent  Peroneal artery: patent  Posterior tibial artery: patent  Pedal circulation: patent  DESCRIPTION OF PROCEDURE: After identification of the patient in the pre-operative holding area, the patient was transferred to the operating room. The patient was positioned supine on the operating room table. Anesthesia was induced. The groins was prepped and draped in standard fashion. A surgical pause was performed confirming correct patient, procedure, and operative location.  The previous thrombolysis catheter was  wired with a V18 wire and removed from body and discarded.  I advanced a straight flush catheter in the popliteal artery and began performing angiography in stations from the popliteal position.  We worked backwards performing angiography until we withdrew the catheter into the terminal aorta and performed a pelvic angiogram.  See above for details.  Excellent result was noted from thrombolysis.  Mild residual stenosis in the SFA which is felt to be chronic was not treated.  All endovascular equipment was removed.  Perclose suture was used to close the arteriotomy.  This failed.  Manual pressure was held.  Conscious sedation was administered with the use of IV fentanyl  and midazolam  under continuous physician and nurse monitoring.  Heart rate, blood pressure, and oxygen saturation were continuously monitored.  Total sedation time was 13 minutes  Upon completion of the case instrument and sharps counts were confirmed correct. The patient was transferred to the PACU in good condition. I was present for all portions of the procedure.  PLAN: ASA / Plavix  / Eliquis  indefinitely. Follow up in 4 weeks with ABI and LLE iliac duplex.  Debby SAILOR. Magda, MD [facility] Phone Number: [phone] 11/10/2024 3:16 PM

## 2024-11-11 ENCOUNTER — Encounter (HOSPITAL_COMMUNITY): Payer: Self-pay | Admitting: Vascular Surgery

## 2024-11-11 DIAGNOSIS — I745 Embolism and thrombosis of iliac artery: Secondary | ICD-10-CM | POA: Diagnosis not present

## 2024-11-11 DIAGNOSIS — I10 Essential (primary) hypertension: Secondary | ICD-10-CM | POA: Diagnosis not present

## 2024-11-11 DIAGNOSIS — Z48812 Encounter for surgical aftercare following surgery on the circulatory system: Secondary | ICD-10-CM

## 2024-11-11 DIAGNOSIS — E785 Hyperlipidemia, unspecified: Secondary | ICD-10-CM | POA: Diagnosis not present

## 2024-11-11 DIAGNOSIS — I70222 Atherosclerosis of native arteries of extremities with rest pain, left leg: Secondary | ICD-10-CM | POA: Diagnosis not present

## 2024-11-11 LAB — CBC
HCT: 32.1 % — ABNORMAL LOW (ref 36.0–46.0)
Hemoglobin: 10.5 g/dL — ABNORMAL LOW (ref 12.0–15.0)
MCH: 30.1 pg (ref 26.0–34.0)
MCHC: 32.7 g/dL (ref 30.0–36.0)
MCV: 92 fL (ref 80.0–100.0)
Platelets: 185 10*3/uL (ref 150–400)
RBC: 3.49 MIL/uL — ABNORMAL LOW (ref 3.87–5.11)
RDW: 14.3 % (ref 11.5–15.5)
WBC: 6.2 10*3/uL (ref 4.0–10.5)
nRBC: 0 % (ref 0.0–0.2)

## 2024-11-11 LAB — BASIC METABOLIC PANEL WITH GFR
Anion gap: 11 (ref 5–15)
BUN: 11 mg/dL (ref 8–23)
CO2: 18 mmol/L — ABNORMAL LOW (ref 22–32)
Calcium: 8.6 mg/dL — ABNORMAL LOW (ref 8.9–10.3)
Chloride: 107 mmol/L (ref 98–111)
Creatinine, Ser: 0.92 mg/dL (ref 0.44–1.00)
GFR, Estimated: >60 mL/min
Glucose, Bld: 123 mg/dL — ABNORMAL HIGH (ref 70–99)
Potassium: 3.9 mmol/L (ref 3.5–5.1)
Sodium: 137 mmol/L (ref 135–145)

## 2024-11-11 LAB — GLUCOSE, CAPILLARY
Glucose-Capillary: 132 mg/dL — ABNORMAL HIGH (ref 70–99)
Glucose-Capillary: 166 mg/dL — ABNORMAL HIGH (ref 70–99)
Glucose-Capillary: 113 mg/dL — ABNORMAL HIGH (ref 70–99)
Glucose-Capillary: 135 mg/dL — ABNORMAL HIGH (ref 70–99)
Glucose-Capillary: 123 mg/dL — ABNORMAL HIGH (ref 70–99)
Glucose-Capillary: 154 mg/dL — ABNORMAL HIGH (ref 70–99)

## 2024-11-11 MED ORDER — APIXABAN 5 MG PO TABS
10.0000 mg | ORAL_TABLET | Freq: Two times a day (BID) | ORAL | Status: DC
  Administered 2024-11-11 – 2024-11-13 (×5): 10 mg via ORAL
  Filled 2024-11-11 (×5): qty 2

## 2024-11-11 MED ORDER — APIXABAN 5 MG PO TABS: 5.0000 mg | ORAL_TABLET | Freq: Two times a day (BID) | ORAL | Status: DC

## 2024-11-11 MED ORDER — POLYETHYLENE GLYCOL 3350 17 G PO PACK
17.0000 g | PACK | Freq: Every day | ORAL | Status: DC
  Administered 2024-11-12: 17 g via ORAL
  Filled 2024-11-11 (×3): qty 1

## 2024-11-11 NOTE — Care Management (Signed)
 SDOH resources added to AVS

## 2024-11-11 NOTE — Progress Notes (Signed)
 PHARMACY - ANTICOAGULATION CONSULT NOTE  Pharmacy Consult for heparin > apixaban  Indication: limb ischemia  Labs: Recent Labs    11/09/24 1006 11/09/24 1828 11/09/24 2319 11/10/24 0440 11/10/24 1033 11/11/24 0238  HGB  --    < > 10.6* 10.9* 10.9* 10.5*  HCT  --    < > 31.6* 33.2* 33.3* 32.1*  PLT  --    < > 206 201 191 185  APTT 176*  --   --   --   --   --   HEPARINUNFRC >1.10*   < > 0.54 0.20* 0.16*  --   CREATININE  --   --  1.05* 0.94  --  0.92   < > = values in this interval not displayed.   Assessment: 69yo female s/p EKOS cath directed therapy for limb ischemia. Pt back to OR today where catheters removed. Per vascular, okay to restart heparin  8 hours post procedure. Case ended ~1500. Plan for ASA, plavix , and Eliquis  indefinitely  -plans are to change to apixaban  (cost $12.65 per month) -noted on PPI  Goal of Therapy:  Heparin  level 0.3-0.5 units/ml   Plan:  -apixaban  10mg  po bid for 7 days followed by 5mg  po bid  Prentice Poisson, PharmD Clinical Pharmacist **Pharmacist phone directory can now be found on [email or web address] (PW [email or web address]).  Listed under [facility].

## 2024-11-11 NOTE — Discharge Instructions (Signed)
 [facility] [address] [place] [address] [phone] PANTRY 3rd Th: 9a - 12p; 3rd Th: 4p - 7p  [employer] [facility] [address] [place] [address] [phone] PANTRY varies - call agency  [address] Zoe Snyder [facility] [address] [place] [address] [phone] PANTRY 2nd & 4th Saturday 11am-1pm  [address] [place] [address] [phone] PANTRY by appointment Sa: 10:30a - 11:30a  Zoe Snyder [address] [place] [address] [phone] PANTRY Sa: 10a - 12p  on varying weeks; call agency for schedule  Zoe Snyder [facility] [employer] [address] [place] [address] [phone] PANTRY Tu : 10a - 12p; W: 10a - 12p; Th: 10a - 12p  Zoe Snyder Zoe Snyder [address] [place] [address] [phone] PANTRY 1st & 3rd Tu: 10a - 1p; 3rd Sa: 10a - 12p  [name] [address] [place] [address] [phone] PANTRY W (By Appointment only - call prior to W to make an appointment)  [name] [address] [place] [address] [phone] Zoe Snyder M - F: times and dates vary - call agency  [facility] [address] [facility] [address] [phone] PANTRY 1st & 3rd Th: 10a-2p  [name] [address] [place] [address] [phone] PANTRY 3rd Sat of month: 11a-1p  [name] [address] [place] [address] [phone] Zoe Snyder 4th [place]: 11a - 2p  [name] [address] [place]oe [place] [address] [phone] PANTRY weekly on W 9a-10:30a  [name] [address] [place] [address] [phone] PANTRY 2nd & 4th Th: 1p - 2:30p  [facility] [address] [place] [address] [phone] PANTRY 1st & 3rd F: 9a-12p; 2nd&4th Sa: 9a-12p  [name] [address] [place] [address] [phone]  PANTRY T-TH 9am-2pm   [name] [phone] Zoe Snyder Zoe Snyder [address] [phone] PANTRY Thursdays 10am-12:30pm  [employer] [facility] [address] [place] [address] [phone] PANTRY 4th Sa: 9a - [address] of [employer] [facility] Zoe Snyder [place]oe [place] [address] [phone] PANTRY 2nd M 5:30p - 7:30p; each W 9:30a - 11:30a  [facility] Zoe Snyder [address] [place] [address] [phone] PANTRY Tues 11a-1p call for appointment preferred  Zoe Snyder [address] [place] [address] [phone] PANTRY mobile distribution sites; call agency for listing  [name] [address] [place] [address] [phone] PANTRY W: 9a-11a  [name] [address] [place] [place] [place] [address] [phone] ` T: 12:p -2p  Zoe Snyder [address] [place] [address] [phone] PANTRY M: 9:30a - 2:30p; Tu: 9:30a - 2:30p; W: 9:30a - 2:30p; Th: 9:30a - 2:30p  Zoe Snyder [address] Zoe Snyder [address] [phone] PANTRY F: 11a - 2p  Social Connections Resources   Active Adults Program https://www.[place].gov/departments/parks-recreation/active-adults-50  -Adding Health to Our Years Courses  -Aquatics  -Exercise Classes: Yoga, Dance, [place], etc.  -Book Club, Games  Locations vary by activity - Main Contact # [phone]) - see calendars on website listed above.  [name] [address] [place], [place] [address] / [phone] -[name] [address], [place] [address] / [phone] Zoe Snyder. [facility]) [address], [place], [place] [address] / [phone] [name]  locations in [place], [place], and [place] / [phone] -[name] [address] [place], [place] [address] / [phone]  Program of All Inclusive Care for the  Elderly (PACE) [address] [place], [place] [address] - Office #: [phone] - Enrollment Phone #: [phone]  [name]  Call toll free, available 24 hours a day, [phone]   [name] [address]  [place] 2-1-1 is another useful way to locate resources in the community. Visit shedsizes.ch to find service information online. If you need additional assistance, 2-1-1 Referral Specialists are available 24 hours a day, every day by dialing 2-1-1 or [phone] from any phone. The call is free, confidential, and available in any language.  ___________________________________ Information on my medicine - ELIQUIS  (apixaban )  This medication education was reviewed with me or my healthcare representative as part of my discharge preparation.  The pharmacist that spoke with me during my hospital stay was:  Zoe Snyder, [facility]  Why was Eliquis  prescribed for you? Eliquis  was prescribed for you to reduce the risk of forming blood clots that can cause a stroke if you have a medical condition called atrial fibrillation (a type of irregular heartbeat) OR to reduce the risk of a blood clots forming after orthopedic surgery.  What do You need to know about Eliquis  ? Take your Eliquis  TWICE DAILY - Zoe tablet in the morning and Zoe tablet in the evening with or without food.  It would be best to take the doses about the same time each day.  If you have difficulty swallowing the tablet whole please discuss with your pharmacist how to take the medication safely.  Take Eliquis  exactly as prescribed by your doctor and DO NOT stop taking Eliquis  without talking to the doctor who prescribed the medication.  Stopping may increase your risk of developing a new clot or stroke.  Refill your prescription before you run out.  After discharge, you should have regular check-up appointments with your healthcare provider that is prescribing your Eliquis .  In the future your dose  may need to be changed if your kidney function or weight changes by a significant amount or as you get older.  What do you do if you miss a dose? If you miss a dose, take it as soon as you remember on the same day and resume taking twice daily.  Do not take more than Zoe dose of ELIQUIS  at the same time.  Important Safety Information A possible side effect of Eliquis  is bleeding. You should call your healthcare provider right away if you experience any of the following: Bleeding from an injury or your nose that does not stop. Unusual colored urine (red or dark brown) or unusual colored stools (red or black). Unusual bruising for unknown reasons. A serious fall or if you hit your head (even if there is no bleeding).  Some medicines may interact with Eliquis  and might increase your risk of bleeding or clotting while on Eliquis . To help avoid this, consult your healthcare provider or pharmacist prior to using any new prescription or non-prescription medications, including herbals, vitamins, non-steroidal anti-inflammatory drugs (NSAIDs) and supplements.  This website has more information on Eliquis  (apixaban ): [email or web address] .[email or web address] Zoe Snyder

## 2024-11-11 NOTE — Progress Notes (Signed)
" ° ° °  Subjective  - POD # 2, status post thrombolysis  Foot feels better   Physical Exam:  Palpable femoral pulses. Normal motor and sensory function in the left leg       Assessment/Plan:  POD #2  -Status post successful thrombolytic treatment of occluded iliac artery.  She will need triple therapy at discharge  -Add MiraLAX  for bowel regimen  - Okay to transfer to the floor on medicine service, as she does not have transportation and does not have hot water at home  -  Zoe Snyder 11/11/2024 8:46 AM --  Vitals:   11/11/24 0800 11/11/24 0806  BP:  119/70  Pulse: 90 86  Resp: 16 16  Temp:    SpO2: 97% 95%    Intake/Output Summary (Last 24 hours) at 11/11/2024 0846 Last data filed at 11/11/2024 0700 Gross per 24 hour  Intake 1110.84 ml  Output 1550 ml  Net -439.16 ml     Laboratory CBC    Component Value Date/Time   WBC 6.2 11/11/2024 0238   HGB 10.5 (L) 11/11/2024 0238   HCT 32.1 (L) 11/11/2024 0238   PLT 185 11/11/2024 0238    BMET    Component Value Date/Time   NA 137 11/11/2024 0238   K 3.9 11/11/2024 0238   CL 107 11/11/2024 0238   CO2 18 (L) 11/11/2024 0238   GLUCOSE 123 (H) 11/11/2024 0238   BUN 11 11/11/2024 0238   CREATININE 0.92 11/11/2024 0238   CREATININE 0.95 11/29/2017 1609   CALCIUM  8.6 (L) 11/11/2024 0238   GFRNONAA >60 11/11/2024 0238   GFRNONAA 65 11/29/2017 1609   GFRAA 75 11/29/2017 1609    COAG Lab Results  Component Value Date   INR 1.2 04/12/2024   No results found for: PTT  Antibiotics Anti-infectives (From admission, onward)    None        V. Malvina Serene CLORE, M.D., [facility] [place] Office: [phone] Pager:  [phone]  "

## 2024-11-12 DIAGNOSIS — I70209 Unspecified atherosclerosis of native arteries of extremities, unspecified extremity: Secondary | ICD-10-CM | POA: Diagnosis not present

## 2024-11-12 DIAGNOSIS — E785 Hyperlipidemia, unspecified: Secondary | ICD-10-CM | POA: Diagnosis not present

## 2024-11-12 DIAGNOSIS — N179 Acute kidney failure, unspecified: Secondary | ICD-10-CM

## 2024-11-12 DIAGNOSIS — I70229 Atherosclerosis of native arteries of extremities with rest pain, unspecified extremity: Secondary | ICD-10-CM | POA: Diagnosis not present

## 2024-11-12 DIAGNOSIS — E039 Hypothyroidism, unspecified: Secondary | ICD-10-CM

## 2024-11-12 DIAGNOSIS — E782 Mixed hyperlipidemia: Secondary | ICD-10-CM

## 2024-11-12 DIAGNOSIS — I998 Other disorder of circulatory system: Secondary | ICD-10-CM

## 2024-11-12 DIAGNOSIS — I10 Essential (primary) hypertension: Secondary | ICD-10-CM | POA: Diagnosis not present

## 2024-11-12 LAB — GLUCOSE, CAPILLARY
Glucose-Capillary: 108 mg/dL — ABNORMAL HIGH (ref 70–99)
Glucose-Capillary: 174 mg/dL — ABNORMAL HIGH (ref 70–99)
Glucose-Capillary: 75 mg/dL (ref 70–99)
Glucose-Capillary: 104 mg/dL — ABNORMAL HIGH (ref 70–99)
Glucose-Capillary: 121 mg/dL — ABNORMAL HIGH (ref 70–99)

## 2024-11-12 LAB — CBC
HCT: 32.7 % — ABNORMAL LOW (ref 36.0–46.0)
Hemoglobin: 11.1 g/dL — ABNORMAL LOW (ref 12.0–15.0)
MCH: 30.4 pg (ref 26.0–34.0)
MCHC: 33.9 g/dL (ref 30.0–36.0)
MCV: 89.6 fL (ref 80.0–100.0)
Platelets: 179 10*3/uL (ref 150–400)
RBC: 3.65 MIL/uL — ABNORMAL LOW (ref 3.87–5.11)
RDW: 14.1 % (ref 11.5–15.5)
WBC: 6.1 10*3/uL (ref 4.0–10.5)
nRBC: 0 % (ref 0.0–0.2)

## 2024-11-12 MED ORDER — INSULIN ASPART 100 UNIT/ML IJ SOLN
0.0000 [IU] | Freq: Three times a day (TID) | INTRAMUSCULAR | Status: DC
  Administered 2024-11-13: 1 [IU] via SUBCUTANEOUS
  Filled 2024-11-12: qty 1

## 2024-11-12 NOTE — Progress Notes (Addendum)
 [facility]  PROGRESS NOTE  Zoe Snyder FMW:[id number] DOB: [birth year] DOA: 11/08/2024 PCP: [facility] [address]   Brief HPI:   69 year old female w/ hx as outlined below presented to the ER 1/28 w/ cc: new back and left leg pain. First noted in back and radiated down the left leg around 1700 when ambulating. Noted the leg felt frozen.  Had stopped plavix  and eliquis  about 3 months ago.   In ER initial lactic acid was 1.8, scr 1.72.  CT angiogram c/w thrombosed iliac stent w/ resultant limb ischemia. She was seen by vascular surgery and subsequently brought for Aortogram & LLE arteriogram that identified an occluded left common iliac artery stent that was occluded down into the SFA.  An EKOS catheter was placed thru the occluded ileac stent down into SFA and started on tPA.       Assessment/Plan:    S/p LLE limb ischemia 2/2 occluded Left ileac stent w/ clot burden extending to the SFA. S/p EKOS catheter placement and tPA 1/29 - Status post EKOS protocol w/ tPA infusion per vascular, started on apixaban , aspirin , Plavix  -SBP goal 120-160** -acetaminophen , oxycodone  PRN for pain PT/OT evaluation    History of hypertension -con't amlodipine  -holding PTA valsartan   History of hyperlipidemia - Continue statin   Hyperglycemia -Hemoglobin A1c 6.3 -Continue sliding scale insulin  with NovoLog  -check A1c, start low dose SSI PRN   H/o hypothyroidism  -resume PTA synthroid    GERD -PPI-PTA med   Tobacco abuse -strongly recommend total cessation -she wants to try Chantix  again, which worked for her in the past; recommend her PCP start this as an OP or she can start at discharge -recommend referral for lung cancer screening   H/o H&N cancer -needs OP follow up with ENT      DVT prophylaxis: Apixaban   Medications     amLODipine   10 mg Oral Daily   apixaban   10 mg Oral BID   Followed by   NOREEN ON 11/18/2024] apixaban   5 mg Oral BID   aspirin  EC  81 mg Oral  Daily   atorvastatin   80 mg Oral QHS   Chlorhexidine  Gluconate Cloth  6 each Topical Daily   cholecalciferol   1,000 Units Oral Daily   clopidogrel   75 mg Oral Q breakfast   insulin  aspart  1-3 Units Subcutaneous Q4H   levothyroxine   75 mcg Oral q morning   multivitamin with minerals  1 tablet Oral Daily   pantoprazole   20 mg Oral Daily   polyethylene glycol  17 g Oral Daily   sodium chloride  flush  3 mL Intravenous Q12H   sodium chloride  flush  3 mL Intravenous Q12H     Data Reviewed:   CBG:  Recent Labs  Lab 11/11/24 1659 11/11/24 1918 11/11/24 2259 11/12/24 0508 11/12/24 0807  GLUCAP 135* 123* 154* 108* 174*    SpO2: 98 % O2 Flow Rate (L/min): 2 L/min    Vitals:   11/12/24 0117 11/12/24 0122 11/12/24 0505 11/12/24 0803  BP:  (!) 149/72 97/67 138/78  Pulse: 71 70 73 77  Resp:   15 18  Temp: 98.7 F (37.1 C)  98 F (36.7 C) 98.3 F (36.8 C)  TempSrc: Oral     SpO2: 100%  93% 98%  Weight:      Height:          Data Reviewed:  Basic Metabolic Panel: Recent Labs  Lab 11/08/24 2138 11/08/24 2225 11/09/24 0356 11/09/24 2319 11/10/24 0440 11/11/24  0238  NA 140 141 136 139 139 137  K 3.8 3.8 4.3 3.5 3.6 3.9  CL 102 105 105 109 109 107  CO2 20*  --  21* 20* 21* 18*  GLUCOSE 212* 213* 126* 119* 158* 123*  BUN 23 26* 21 15 14 11   CREATININE 1.72* 1.90* 1.23* 1.05* 0.94 0.92  CALCIUM  9.8  --  8.9 8.2* 8.3* 8.6*    CBC: Recent Labs  Lab 11/08/24 2138 11/08/24 2225 11/09/24 2319 11/10/24 0440 11/10/24 1033 11/11/24 0238 11/12/24 0043  WBC 11.9*   < > 7.4 7.7 7.8 6.2 6.1  NEUTROABS 9.2*  --   --   --   --   --   --   HGB 12.9   < > 10.6* 10.9* 10.9* 10.5* 11.1*  HCT 38.0   < > 31.6* 33.2* 33.3* 32.1* 32.7*  MCV 89.4   < > 91.3 92.0 92.0 92.0 89.6  PLT 349   < > 206 201 191 185 179   < > = values in this interval not displayed.    LFT Recent Labs  Lab 11/08/24 2138  AST 15  ALT 11  ALKPHOS 96  BILITOT 0.6  PROT 7.4  ALBUMIN 4.1      Antibiotics: Anti-infectives (From admission, onward)    None        CONSULTS   Code Status: Full code  Family Communication: No family at bedside     Subjective   Patient seen and examined, denies leg pain or numbness.   Objective    Physical Examination:   General-appears in no acute distress Heart-S1-S2, regular, no murmur auscultated Lungs-clear to auscultation bilaterally, no wheezing or crackles auscultated Abdomen-soft, nontender, no organomegaly Extremities-no edema in the lower extremities Neuro-alert, oriented x3, no focal deficit noted            Afton Mikelson S [facility] [employer]   [facility] If 7PM-7AM, please contact night-coverage at [email or web address], Office  [phone]   11/12/2024, 9:28 AM  LOS: 3 days

## 2024-11-13 ENCOUNTER — Other Ambulatory Visit (HOSPITAL_COMMUNITY): Payer: Self-pay

## 2024-11-13 DIAGNOSIS — I998 Other disorder of circulatory system: Secondary | ICD-10-CM | POA: Diagnosis not present

## 2024-11-13 DIAGNOSIS — I70229 Atherosclerosis of native arteries of extremities with rest pain, unspecified extremity: Secondary | ICD-10-CM | POA: Diagnosis not present

## 2024-11-13 DIAGNOSIS — E785 Hyperlipidemia, unspecified: Secondary | ICD-10-CM | POA: Diagnosis not present

## 2024-11-13 DIAGNOSIS — N179 Acute kidney failure, unspecified: Secondary | ICD-10-CM | POA: Diagnosis not present

## 2024-11-13 LAB — CBC
HCT: 33.3 % — ABNORMAL LOW (ref 36.0–46.0)
Hemoglobin: 11.5 g/dL — ABNORMAL LOW (ref 12.0–15.0)
MCH: 30.7 pg (ref 26.0–34.0)
MCHC: 34.5 g/dL (ref 30.0–36.0)
MCV: 89 fL (ref 80.0–100.0)
Platelets: 202 10*3/uL (ref 150–400)
RBC: 3.74 MIL/uL — ABNORMAL LOW (ref 3.87–5.11)
RDW: 14.3 % (ref 11.5–15.5)
WBC: 6.7 10*3/uL (ref 4.0–10.5)
nRBC: 0 % (ref 0.0–0.2)

## 2024-11-13 LAB — GLUCOSE, CAPILLARY
Glucose-Capillary: 162 mg/dL — ABNORMAL HIGH (ref 70–99)
Glucose-Capillary: 88 mg/dL (ref 70–99)

## 2024-11-13 MED ORDER — OXYCODONE HCL 5 MG PO TABS
5.0000 mg | ORAL_TABLET | Freq: Four times a day (QID) | ORAL | 0 refills | Status: AC | PRN
  Filled 2024-11-13: qty 10, 3d supply, fill #0

## 2024-11-13 MED ORDER — ASPIRIN 81 MG PO TBEC
81.0000 mg | DELAYED_RELEASE_TABLET | Freq: Every day | ORAL | 0 refills | Status: AC
  Filled 2024-11-13: qty 30, 30d supply, fill #0

## 2024-11-13 MED ORDER — VARENICLINE TARTRATE 0.5 MG PO TABS
0.5000 mg | ORAL_TABLET | Freq: Two times a day (BID) | ORAL | 0 refills | Status: AC
  Filled 2024-11-13: qty 60, 30d supply, fill #0

## 2024-11-13 MED ORDER — APIXABAN 5 MG PO TABS
ORAL_TABLET | ORAL | 0 refills | Status: AC
  Filled 2024-11-13: qty 70, 30d supply, fill #0

## 2024-11-13 MED ORDER — IPRATROPIUM-ALBUTEROL 0.5-2.5 (3) MG/3ML IN SOLN
3.0000 mL | RESPIRATORY_TRACT | 0 refills | Status: AC | PRN
  Filled 2024-11-13: qty 360, 20d supply, fill #0

## 2024-11-13 MED ORDER — POLYETHYLENE GLYCOL 3350 17 GM/SCOOP PO POWD
17.0000 g | Freq: Every day | ORAL | 0 refills | Status: AC
  Filled 2024-11-13: qty 238, 14d supply, fill #0

## 2024-11-13 MED ORDER — CLOPIDOGREL BISULFATE 75 MG PO TABS
75.0000 mg | ORAL_TABLET | Freq: Every day | ORAL | 0 refills | Status: AC
  Filled 2024-11-13: qty 30, 30d supply, fill #0

## 2024-11-13 NOTE — Progress Notes (Addendum)
" °  Progress Note    11/13/2024 8:21 AM 3 Days Post-Op  Subjective: Says her left leg continues to feel much better.  Wants to try Chantix  at discharge    Vitals:   11/13/24 0419 11/13/24 0735  BP: (!) 153/84 139/85  Pulse: 74 77  Resp:    Temp: 97.7 F (36.5 C) 98.7 F (37.1 C)  SpO2: 97% 97%    Physical Exam: General: Resting in bed Cardiac: Regular Lungs: Nonlabored Incisions: Right groin cath site intact and dry with gauze dressing Extremities: BLE warm and well-perfused, palpable femoral pulses  CBC    Component Value Date/Time   WBC 6.7 11/13/2024 0330   RBC 3.74 (L) 11/13/2024 0330   HGB 11.5 (L) 11/13/2024 0330   HCT 33.3 (L) 11/13/2024 0330   PLT 202 11/13/2024 0330   MCV 89.0 11/13/2024 0330   MCH 30.7 11/13/2024 0330   MCHC 34.5 11/13/2024 0330   RDW 14.3 11/13/2024 0330   LYMPHSABS 1.8 11/08/2024 2138   MONOABS 0.7 11/08/2024 2138   EOSABS 0.0 11/08/2024 2138   BASOSABS 0.1 11/08/2024 2138    BMET    Component Value Date/Time   NA 137 11/11/2024 0238   K 3.9 11/11/2024 0238   CL 107 11/11/2024 0238   CO2 18 (L) 11/11/2024 0238   GLUCOSE 123 (H) 11/11/2024 0238   BUN 11 11/11/2024 0238   CREATININE 0.92 11/11/2024 0238   CREATININE 0.95 11/29/2017 1609   CALCIUM  8.6 (L) 11/11/2024 0238   GFRNONAA >60 11/11/2024 0238   GFRNONAA 65 11/29/2017 1609   GFRAA 75 11/29/2017 1609    INR    Component Value Date/Time   INR 1.2 04/12/2024 2352    No intake or output data in the 24 hours ending 11/13/24 9178    Assessment/Plan:  69 y.o. female is 3 days postop, s/p: Thrombolysis of left iliac stents   - She is doing well this morning without any complaints.  She says that her left leg feels much better -Right groin cath site is intact and dry with gauze dressing -Left lower extremity remains warm and well-perfused.  She has a palpable left femoral pulse -Stable for discharge from vascular perspective.  Continue aspirin , Plavix , and Eliquis   at discharge.  Will arrange follow-up with our office in 4 weeks with left aortoiliac duplex and ABIs   Ahmed Holster, PA-C [facility] [phone] 11/13/2024 8:21 AM    "

## 2024-11-13 NOTE — Progress Notes (Signed)
 Transition of Care [facility]) - Inpatient Brief Assessment   Patient Details  Name: Zoe Snyder MRN: [id number] Date of Birth: August 29, 1956  Transition of [facility] CM/SW Contact:    Rosaline JONELLE Joe, RN Phone Number: 11/13/2024, 11:31 AM   Clinical Narrative: Patient admitted to the hospital from home with S/p LLE limb ischemia 2/2 occluded Left ileac stent w/ clot burden extending to the SFA. S/p EKOS catheter placement and tPA 1/29.  Patient states that she lives with a friend at the home and plans to return home when stable.  The patient states that she lives in a friend's home who has dementia and she helps take care of her.  She states that her water heater is broken and hopes to find senior housing, when able, to move to since her and her friend has frequent disagreements.  Resources provided in the AVS for social services for patient to follow up regarding assistance with repairs and affordable senior housing.  Patient states that she is independent and uses not DME for ambulation.  Patient states that she has been using [facility] Medicare to assist with transportation since she does not have a working vehicle.  DME at the home includes inhalers and nebulizer machine.  Patient is a current smoker and hopes to quit - resources provided in the AVS for smoking cessation.  Patient states that she will need transportation assistance to home - via taxi voucher when she is discharged home - I will update discharge lounge.  Patient will likely be discharge home today per patient.   Transition of Care Asessment: Insurance and Status: Insurance coverage has been reviewed   Home environment has been reviewed: from home with friend Prior level of function:: Independent Prior/Current Home Services: No current home services Social Drivers of Health Review: SDOH reviewed interventions complete Readmission risk has been reviewed: Yes Transition of care needs: transition of care needs  identified, TOC will continue to follow

## 2024-11-14 ENCOUNTER — Other Ambulatory Visit (HOSPITAL_COMMUNITY): Payer: Self-pay

## 2024-12-22 ENCOUNTER — Ambulatory Visit (HOSPITAL_COMMUNITY)

## 2024-12-26 ENCOUNTER — Encounter
